# Patient Record
Sex: Male | Born: 1975
Health system: Southern US, Community
[De-identification: ages and names within clinical notes are randomized; demographics above are authoritative.]

## PROBLEM LIST (undated history)

## (undated) DIAGNOSIS — E1110 Type 2 diabetes mellitus with ketoacidosis without coma: Secondary | ICD-10-CM

## (undated) DIAGNOSIS — E1165 Type 2 diabetes mellitus with hyperglycemia: Secondary | ICD-10-CM

## (undated) DIAGNOSIS — F419 Anxiety disorder, unspecified: Secondary | ICD-10-CM

## (undated) DIAGNOSIS — M545 Low back pain, unspecified: Secondary | ICD-10-CM

## (undated) DIAGNOSIS — Z8719 Personal history of other diseases of the digestive system: Secondary | ICD-10-CM

## (undated) DIAGNOSIS — G8929 Other chronic pain: Secondary | ICD-10-CM

## (undated) DIAGNOSIS — IMO0002 Reserved for concepts with insufficient information to code with codable children: Secondary | ICD-10-CM

## (undated) DIAGNOSIS — I10 Essential (primary) hypertension: Secondary | ICD-10-CM

## (undated) DIAGNOSIS — Z8711 Personal history of peptic ulcer disease: Secondary | ICD-10-CM

## (undated) HISTORY — PX: BACK SURGERY: SHX140

---

## 2002-07-04 HISTORY — PX: LUMBAR DISC SURGERY: SHX700

## 2015-03-30 ENCOUNTER — Inpatient Hospital Stay (HOSPITAL_COMMUNITY)
Admission: EM | Admit: 2015-03-30 | Discharge: 2015-04-02 | DRG: 897 | Disposition: A | Discharge status: Home / Self Care | Payer: Self-pay | Attending: Internal Medicine | Admitting: Internal Medicine

## 2015-03-30 ENCOUNTER — Encounter (HOSPITAL_COMMUNITY): Payer: Self-pay | Admitting: Family Medicine

## 2015-03-30 ENCOUNTER — Inpatient Hospital Stay (HOSPITAL_COMMUNITY): Payer: Self-pay

## 2015-03-30 ENCOUNTER — Emergency Department (HOSPITAL_COMMUNITY): Payer: Self-pay

## 2015-03-30 DIAGNOSIS — E1165 Type 2 diabetes mellitus with hyperglycemia: Secondary | ICD-10-CM | POA: Diagnosis present

## 2015-03-30 DIAGNOSIS — M940 Chondrocostal junction syndrome [Tietze]: Secondary | ICD-10-CM | POA: Diagnosis present

## 2015-03-30 DIAGNOSIS — Z9289 Personal history of other medical treatment: Secondary | ICD-10-CM

## 2015-03-30 DIAGNOSIS — E871 Hypo-osmolality and hyponatremia: Secondary | ICD-10-CM | POA: Diagnosis present

## 2015-03-30 DIAGNOSIS — F10931 Alcohol use, unspecified with withdrawal delirium: Secondary | ICD-10-CM

## 2015-03-30 DIAGNOSIS — F10231 Alcohol dependence with withdrawal delirium: Principal | ICD-10-CM | POA: Diagnosis present

## 2015-03-30 DIAGNOSIS — G312 Degeneration of nervous system due to alcohol: Secondary | ICD-10-CM | POA: Diagnosis present

## 2015-03-30 DIAGNOSIS — E876 Hypokalemia: Secondary | ICD-10-CM | POA: Diagnosis present

## 2015-03-30 DIAGNOSIS — R109 Unspecified abdominal pain: Secondary | ICD-10-CM | POA: Diagnosis present

## 2015-03-30 DIAGNOSIS — E118 Type 2 diabetes mellitus with unspecified complications: Secondary | ICD-10-CM

## 2015-03-30 DIAGNOSIS — E119 Type 2 diabetes mellitus without complications: Secondary | ICD-10-CM | POA: Diagnosis present

## 2015-03-30 DIAGNOSIS — E1110 Type 2 diabetes mellitus with ketoacidosis without coma: Secondary | ICD-10-CM | POA: Diagnosis present

## 2015-03-30 HISTORY — DX: Alcohol dependence with withdrawal delirium: F10.231

## 2015-03-30 HISTORY — DX: Alcohol use, unspecified with withdrawal delirium: F10.931

## 2015-03-30 LAB — COMPREHENSIVE METABOLIC PANEL
ALT: 50 U/L (ref 17–63)
ALT: 43 U/L (ref 17–63)
ANION GAP: 11 (ref 5–15)
AST: 39 U/L (ref 15–41)
AST: 39 U/L (ref 15–41)
Albumin: 4.1 g/dL (ref 3.5–5.0)
Albumin: 3.7 g/dL (ref 3.5–5.0)
Alkaline Phosphatase: 93 U/L (ref 38–126)
Alkaline Phosphatase: 69 U/L (ref 38–126)
Anion gap: 11 (ref 5–15)
BUN: 11 mg/dL (ref 6–20)
BUN: 9 mg/dL (ref 6–20)
CHLORIDE: 103 mmol/L (ref 101–111)
CO2: 25 mmol/L (ref 22–32)
CO2: 22 mmol/L (ref 22–32)
Calcium: 9.4 mg/dL (ref 8.9–10.3)
Calcium: 8.7 mg/dL — ABNORMAL LOW (ref 8.9–10.3)
Chloride: 98 mmol/L — ABNORMAL LOW (ref 101–111)
Creatinine, Ser: 0.96 mg/dL (ref 0.61–1.24)
Creatinine, Ser: 0.68 mg/dL (ref 0.61–1.24)
GFR calc Af Amer: >60 mL/min (ref >60)
GFR calc non Af Amer: >60 mL/min (ref >60)
GFR calc non Af Amer: >60 mL/min (ref >60)
Glucose, Bld: 444 mg/dL — ABNORMAL HIGH (ref 65–99)
Glucose, Bld: 253 mg/dL — ABNORMAL HIGH (ref 65–99)
POTASSIUM: 3.5 mmol/L (ref 3.5–5.1)
Potassium: 4 mmol/L (ref 3.5–5.1)
SODIUM: 136 mmol/L (ref 135–145)
Sodium: 134 mmol/L — ABNORMAL LOW (ref 135–145)
Total Bilirubin: 0.8 mg/dL (ref 0.3–1.2)
Total Bilirubin: 0.6 mg/dL (ref 0.3–1.2)
Total Protein: 7.2 g/dL (ref 6.5–8.1)
Total Protein: 6.3 g/dL — ABNORMAL LOW (ref 6.5–8.1)

## 2015-03-30 LAB — CBC WITH DIFFERENTIAL/PLATELET
Basophils Absolute: 0.1 10*3/uL (ref 0.0–0.1)
Basophils Relative: 1 %
EOS ABS: 0.1 10*3/uL (ref 0.0–0.7)
EOS PCT: 2 %
HCT: 38.3 % — ABNORMAL LOW (ref 39.0–52.0)
Hemoglobin: 12.4 g/dL — ABNORMAL LOW (ref 13.0–17.0)
LYMPHS ABS: 1.5 10*3/uL (ref 0.7–4.0)
Lymphocytes Relative: 22 %
MCH: 26.2 pg (ref 26.0–34.0)
MCHC: 32.4 g/dL (ref 30.0–36.0)
MCV: 80.8 fL (ref 78.0–100.0)
MONOS PCT: 15 %
Monocytes Absolute: 1.1 10*3/uL — ABNORMAL HIGH (ref 0.1–1.0)
Neutro Abs: 4.2 10*3/uL (ref 1.7–7.7)
Neutrophils Relative %: 60 %
PLATELETS: 170 10*3/uL (ref 150–400)
RBC: 4.74 MIL/uL (ref 4.22–5.81)
RDW: 17.6 % — AB (ref 11.5–15.5)
WBC: 6.9 10*3/uL (ref 4.0–10.5)

## 2015-03-30 LAB — CBG MONITORING, ED: GLUCOSE-CAPILLARY: 244 mg/dL — AB (ref 65–99)

## 2015-03-30 LAB — PROTIME-INR
INR: 1.1 (ref 0.00–1.49)
PROTHROMBIN TIME: 14.4 s (ref 11.6–15.2)

## 2015-03-30 LAB — GLUCOSE, CAPILLARY
GLUCOSE-CAPILLARY: 86 mg/dL (ref 65–99)
Glucose-Capillary: 163 mg/dL — ABNORMAL HIGH (ref 65–99)

## 2015-03-30 LAB — CBC
HCT: 42.1 % (ref 39.0–52.0)
Hemoglobin: 13.9 g/dL (ref 13.0–17.0)
MCH: 26.8 pg (ref 26.0–34.0)
MCHC: 33 g/dL (ref 30.0–36.0)
MCV: 81.1 fL (ref 78.0–100.0)
Platelets: 175 10*3/uL (ref 150–400)
RBC: 5.19 MIL/uL (ref 4.22–5.81)
RDW: 17.6 % — ABNORMAL HIGH (ref 11.5–15.5)
WBC: 7.4 10*3/uL (ref 4.0–10.5)

## 2015-03-30 LAB — URINALYSIS, ROUTINE W REFLEX MICROSCOPIC
Bilirubin Urine: NEGATIVE
Glucose, UA: >1000 mg/dL — AB
Hgb urine dipstick: NEGATIVE
Ketones, ur: 15 mg/dL — AB
Leukocytes, UA: NEGATIVE
Nitrite: NEGATIVE
Protein, ur: NEGATIVE mg/dL
Specific Gravity, Urine: 1.021 (ref 1.005–1.030)
Urobilinogen, UA: 1 mg/dL (ref 0.0–1.0)
pH: 5.5 (ref 5.0–8.0)

## 2015-03-30 LAB — I-STAT VENOUS BLOOD GAS, ED
Acid-base deficit: 3 mmol/L — ABNORMAL HIGH (ref 0.0–2.0)
Bicarbonate: 21.8 mEq/L (ref 20.0–24.0)
O2 Saturation: 100 %
TCO2: 23 mmol/L (ref 0–100)
pCO2, Ven: 37.8 mmHg — ABNORMAL LOW (ref 45.0–50.0)
pH, Ven: 7.368 — ABNORMAL HIGH (ref 7.250–7.300)
pO2, Ven: 193 mmHg — ABNORMAL HIGH (ref 30.0–45.0)

## 2015-03-30 LAB — I-STAT ARTERIAL BLOOD GAS, ED
ACID-BASE DEFICIT: 2 mmol/L (ref 0.0–2.0)
Bicarbonate: 24.6 mEq/L — ABNORMAL HIGH (ref 20.0–24.0)
O2 SAT: 98 %
PCO2 ART: 49.8 mmHg — AB (ref 35.0–45.0)
PH ART: 7.307 — AB (ref 7.350–7.450)
TCO2: 26 mmol/L (ref 0–100)
pO2, Arterial: 117 mmHg — ABNORMAL HIGH (ref 80.0–100.0)

## 2015-03-30 LAB — RAPID URINE DRUG SCREEN, HOSP PERFORMED
AMPHETAMINES: NOT DETECTED
BENZODIAZEPINES: NOT DETECTED
Barbiturates: NOT DETECTED
Cocaine: NOT DETECTED
OPIATES: NOT DETECTED
TETRAHYDROCANNABINOL: NOT DETECTED

## 2015-03-30 LAB — APTT: aPTT: 23 seconds — ABNORMAL LOW (ref 24–37)

## 2015-03-30 LAB — LIPASE, BLOOD: LIPASE: 35 U/L (ref 22–51)

## 2015-03-30 LAB — ETHANOL: Alcohol, Ethyl (B): <5 mg/dL (ref <5)

## 2015-03-30 LAB — URINE MICROSCOPIC-ADD ON

## 2015-03-30 LAB — PHOSPHORUS: PHOSPHORUS: 4.1 mg/dL (ref 2.5–4.6)

## 2015-03-30 LAB — I-STAT CG4 LACTIC ACID, ED: Lactic Acid, Venous: 4.45 mmol/L (ref 0.5–2.0)

## 2015-03-30 LAB — MRSA PCR SCREENING: MRSA by PCR: NEGATIVE

## 2015-03-30 LAB — MAGNESIUM: Magnesium: 1.8 mg/dL (ref 1.7–2.4)

## 2015-03-30 MED ORDER — VITAMIN B-1 100 MG PO TABS
100.0000 mg | ORAL_TABLET | Freq: Every day | ORAL | Status: DC
  Administered 2015-04-01: 100 mg via ORAL
  Filled 2015-03-30: qty 1

## 2015-03-30 MED ORDER — DEXMEDETOMIDINE HCL IN NACL 200 MCG/50ML IV SOLN: 0.4000 ug/kg/h | INTRAVENOUS | Status: DC

## 2015-03-30 MED ORDER — SODIUM CHLORIDE 0.9 % IV SOLN
INTRAVENOUS | Status: DC
  Administered 2015-03-30: 100 mL/h via INTRAVENOUS
  Administered 2015-03-30 – 2015-04-01 (×5): via INTRAVENOUS

## 2015-03-30 MED ORDER — DEXMEDETOMIDINE HCL IN NACL 400 MCG/100ML IV SOLN
0.4000 ug/kg/h | INTRAVENOUS | Status: DC
  Administered 2015-03-30 (×2): 0.4 ug/kg/h via INTRAVENOUS
  Administered 2015-03-30: 1.2 ug/kg/h via INTRAVENOUS
  Filled 2015-03-30: qty 50
  Filled 2015-03-30: qty 100

## 2015-03-30 MED ORDER — THIAMINE HCL 100 MG/ML IJ SOLN
100.0000 mg | Freq: Every day | INTRAMUSCULAR | Status: DC
  Administered 2015-03-30 – 2015-03-31 (×2): 100 mg via INTRAVENOUS
  Filled 2015-03-30 (×2): qty 2

## 2015-03-30 MED ORDER — KETAMINE HCL 50 MG/ML IJ SOLN
200.0000 mg | Freq: Once | INTRAMUSCULAR | Status: AC
  Administered 2015-03-30: 200 mg via INTRAMUSCULAR
  Filled 2015-03-30: qty 4

## 2015-03-30 MED ORDER — LORAZEPAM 2 MG/ML IJ SOLN
0.0000 mg | Freq: Two times a day (BID) | INTRAMUSCULAR | Status: DC
  Administered 2015-03-31 – 2015-04-01 (×2): 1 mg via INTRAVENOUS
  Filled 2015-03-30 (×2): qty 1

## 2015-03-30 MED ORDER — CETYLPYRIDINIUM CHLORIDE 0.05 % MT LIQD
7.0000 mL | Freq: Two times a day (BID) | OROMUCOSAL | Status: DC
  Administered 2015-03-30 – 2015-04-02 (×6): 7 mL via OROMUCOSAL

## 2015-03-30 MED ORDER — KETAMINE HCL 50 MG/ML IJ SOLN
325.0000 mg | Freq: Once | INTRAMUSCULAR | Status: AC
Start: 2015-03-30 — End: 2015-03-30
  Administered 2015-03-30: 325 mg via INTRAMUSCULAR
  Filled 2015-03-30: qty 6.5

## 2015-03-30 MED ORDER — LORAZEPAM 2 MG/ML IJ SOLN
0.0000 mg | Freq: Four times a day (QID) | INTRAMUSCULAR | Status: DC
  Administered 2015-03-30: 2 mg via INTRAVENOUS

## 2015-03-30 MED ORDER — LORAZEPAM 1 MG PO TABS: 0.0000 mg | ORAL_TABLET | Freq: Four times a day (QID) | ORAL | Status: DC

## 2015-03-30 MED ORDER — LORAZEPAM 1 MG PO TABS
0.0000 mg | ORAL_TABLET | Freq: Two times a day (BID) | ORAL | Status: DC
Start: 2015-03-30 — End: 2015-03-30

## 2015-03-30 MED ORDER — DIPHENHYDRAMINE HCL 50 MG/ML IJ SOLN: 50.0000 mg | Freq: Once | INTRAMUSCULAR | Status: DC

## 2015-03-30 MED ORDER — LORAZEPAM 2 MG/ML IJ SOLN
2.0000 mg | Freq: Once | INTRAMUSCULAR | Status: AC
  Administered 2015-03-30: 2 mg via INTRAVENOUS
  Filled 2015-03-30: qty 1

## 2015-03-30 MED ORDER — INSULIN ASPART 100 UNIT/ML ~~LOC~~ SOLN
2.0000 [IU] | SUBCUTANEOUS | Status: DC
  Administered 2015-03-30: 6 [IU] via SUBCUTANEOUS
  Administered 2015-03-30: 4 [IU] via SUBCUTANEOUS
  Administered 2015-03-31: 2 [IU] via SUBCUTANEOUS
  Administered 2015-03-31: 4 [IU] via SUBCUTANEOUS
  Filled 2015-03-30: qty 1

## 2015-03-30 MED ORDER — THIAMINE HCL 100 MG/ML IJ SOLN
Freq: Once | INTRAVENOUS | Status: AC
  Administered 2015-03-31: 14:00:00 via INTRAVENOUS
  Filled 2015-03-30 (×2): qty 1000

## 2015-03-30 MED ORDER — LORAZEPAM 2 MG/ML IJ SOLN
0.0000 mg | Freq: Four times a day (QID) | INTRAMUSCULAR | Status: DC
  Administered 2015-03-31 – 2015-04-01 (×2): 2 mg via INTRAVENOUS
  Filled 2015-03-30 (×3): qty 1

## 2015-03-30 MED ORDER — ZIPRASIDONE MESYLATE 20 MG IM SOLR
20.0000 mg | Freq: Once | INTRAMUSCULAR | Status: AC
  Administered 2015-03-30: 20 mg via INTRAMUSCULAR
  Filled 2015-03-30: qty 20

## 2015-03-30 MED ORDER — SODIUM CHLORIDE 0.9 % IV SOLN
Freq: Once | INTRAVENOUS | Status: AC
  Administered 2015-03-30: 15:00:00 via INTRAVENOUS
  Filled 2015-03-30: qty 1000

## 2015-03-30 MED ORDER — DIPHENHYDRAMINE HCL 50 MG/ML IJ SOLN
50.0000 mg | Freq: Once | INTRAMUSCULAR | Status: AC
  Administered 2015-03-30: 50 mg via INTRAVENOUS

## 2015-03-30 MED ORDER — PANTOPRAZOLE SODIUM 40 MG IV SOLR
40.0000 mg | INTRAVENOUS | Status: DC
  Administered 2015-03-30 – 2015-04-01 (×3): 40 mg via INTRAVENOUS
  Filled 2015-03-30 (×3): qty 40

## 2015-03-30 MED ORDER — DIPHENHYDRAMINE HCL 50 MG/ML IJ SOLN
INTRAMUSCULAR | Status: AC
  Filled 2015-03-30: qty 1

## 2015-03-30 NOTE — ED Notes (Signed)
Family at the bedside and verified of patient's condition. Family verbalized consent.

## 2015-03-30 NOTE — ED Notes (Addendum)
Patient continued to be combative. Patient was not responding to medication. MD Gwendolyn Grant and PA Ebbie Ridge at the bedside with this RN and Security. Patient was continuing to be combative even when asked to calm down. Patient was alert, breathing, and good pulses continued.

## 2015-03-30 NOTE — Progress Notes (Signed)
eLink Physician-Brief Progress Note Patient Name: MATTHEW CINA DOB: 02/03/1976 MRN: [id number]   Date of Service  03/30/2015  HPI/Events of Note  The pt was seen in ICU via elink .  He is now calm after precedex initiation.  BP better with volume   eICU Interventions  Recheck lactic acid     Intervention Category Major Interventions: Delirium, psychosis, severe agitation - evaluation and management  Shan Levans 03/30/2015, 7:39 PM

## 2015-03-30 NOTE — ED Notes (Signed)
Called [facility] after request of [facility] to call and verify patient's bed placement. AC stated He would take care of it.

## 2015-03-30 NOTE — ED Notes (Signed)
PA Lawyer notified of patient CIWA, pt has safety sitter at bedside due to patient attempting to get out of bed. Pt pulled IV out.

## 2015-03-30 NOTE — ED Notes (Signed)
Pt Niece request to be called if Pt is roomed elsewhere [phone] [facility]

## 2015-03-30 NOTE — ED Notes (Signed)
Spoke with admitting. Patient is to be transported upstairs with bolus.

## 2015-03-30 NOTE — H&P (Signed)
PULMONARY / CRITICAL CARE MEDICINE   Name: Jacob Schneider MRN: [id number] DOB: [birth year]    ADMISSION DATE:  03/30/2015 CONSULTATION DATE:  9/26  REFERRING MD :  Hermelinda Medicus PA-C  CHIEF COMPLAINT:  DTs  CHIEF COMPLAINT: AMS  INITIAL PRESENTATION: 39 year old male new to [place] from [place]. ETOH abuse history. Last drink 9/20. Initially family hoped he could withdrawal at home, but confusion and hallucinations progressed and he presented to ED 9/26 with these complaints. Agitation was refractory to benzodiazepines, anti-psychotics, and ketamine in ED. PCCM to admit for precedex.   STUDIES:  SIGNIFICANT EVENTS:  9/26 Abdominal US > No acute findings. Increased parenchymal echogenicity suggestive of hepatic steatosis.   HISTORY OF PRESENT ILLNESS: 39 year old male with PMH as below, which includes DM and ETOH abuse. He is a Spanish speaking male, and history has been obtained by    PAST MEDICAL HISTORY :   has a past medical history of Diabetes mellitus without complication.  has no past surgical history on file. Prior to Admission medications   Not on File   No Known Allergies  FAMILY HISTORY:  has no family status information on file.  SOCIAL HISTORY:  reports that he has never smoked. He does not have any smokeless tobacco history on file. He reports that he drinks alcohol.  REVIEW OF SYSTEMS:  Unable   SUBJECTIVE:  Agitated and restless  VITAL SIGNS: Temp:  [98.3 F (36.8 C)] 98.3 F (36.8 C) (09/26 0916) Pulse Rate:  [76-95] 92 (09/26 1430) Resp:  [19-26] 19 (09/26 1430) BP: (128-142)/(83-103) 135/83 mmHg (09/26 1430) SpO2:  [99 %-100 %] 100 % (09/26 1315) Weight:  [81.647 kg (180 lb)] 81.647 kg (180 lb) (09/26 1500) HEMODYNAMICS:   VENTILATOR SETTINGS:   INTAKE / OUTPUT: No intake or output data in the 24 hours ending 03/30/15 1529  PHYSICAL EXAMINATION: General:   Neuro:  Agitated, restless and hallucinating moving all ext  HEENT:  NCAT no JVD  MMM Cardiovascular:  Tachy rrr  Lungs:  Clear w/out accessory muscle use  Abdomen:  Soft, non-distended no OM + bowel sounds  Musculoskeletal:  Intact  Skin:  Intact   LABS:  CBC  Recent Labs Lab 03/30/15 0931  WBC 7.4  HGB 13.9  HCT 42.1  PLT 175   Coag's No results for input(s): APTT, INR in the last 168 hours. BMET  Recent Labs Lab 03/30/15 0931  NA 134*  K 4.0  CL 98*  CO2 25  BUN 11  CREATININE 0.96  GLUCOSE 444*   Electrolytes  Recent Labs Lab 03/30/15 0931  CALCIUM 9.4   Sepsis Markers No results for input(s): LATICACIDVEN, PROCALCITON, O2SATVEN in the last 168 hours. ABG No results for input(s): PHART, PCO2ART, PO2ART in the last 168 hours. Liver Enzymes  Recent Labs Lab 03/30/15 0931  AST 39  ALT 50  ALKPHOS 93  BILITOT 0.8  ALBUMIN 4.1   Cardiac Enzymes No results for input(s): TROPONINI, PROBNP in the last 168 hours. Glucose No results for input(s): GLUCAP in the last 168 hours.  Imaging US Abdomen Complete  03/30/2015   CLINICAL DATA:  Upper abdominal pain  EXAM: ULTRASOUND ABDOMEN COMPLETE  COMPARISON:  None.  FINDINGS: Gallbladder: No gallstones or wall thickening visualized. No sonographic Murphy sign noted.  Common bile duct: Diameter: 5.1 mm  Liver: No focal lesion identified. Increase in parenchymal echogenicity.  IVC: No abnormality visualized.  Pancreas: Visualized portion unremarkable.  Spleen: Size and appearance within normal limits.  Right Kidney: Length: 11.7 cm. Echogenicity within normal limits. No mass or hydronephrosis visualized.  Left Kidney: Length: 11.5 cm. Echogenicity within normal limits. No mass or hydronephrosis visualized.  Abdominal aorta: No aneurysm visualized.  Other findings: None.  IMPRESSION: 1. No acute findings. 2. Increased parenchymal echogenicity suggestive of hepatic steatosis.   Electronically Signed   By: Signa Kell M.D.   On: 03/30/2015 14:18     ASSESSMENT / PLAN:  PULMONARY OETT A: At  risk for airway compromise  P:   NPO Wean FIO2 as needed    CARDIOVASCULAR CVL A:  Sinus tachycardia in setting of w/d P:  Admit to ICU Tele monitoring    RENAL A:   Mild hyponatremia  P:   NS F/u labs   GASTROINTESTINAL A:   No acute  P:   Diet when able   HEMATOLOGIC A:   No acute  P:  [place] heparin  Transfuse per ICU protocol   INFECTIOUS A:   No evidence of infection  P:   Trend fever and WBC curve   ENDOCRINE A:   Hyperglycemia w/ h/o DM  P:   ssi  NEUROLOGIC A:   Acute encephalopathy in setting of Delirium tremens  P:   RASS goal: -1 precedex gtt  IV thiamine and folate Sitter if needed    FAMILY  - Updates: pending   - Inter-disciplinary family meet or Palliative Care meeting due by:10/2   TODAY'S SUMMARY:  Will admit to ICU for DTs and support w/ precedex, thiamine and folate.   Simonne Martinet ACNP-BC [facility] Pulmonary/Critical Care Pager # [phone] OR # [phone] if no answer   03/30/2015, 3:29 PM   Attending Note:  39 year old male with history of alcohol abuse who moved from [place] to [place] to be with family to help him with alcoholism and help him find a job. Last drink was Tuesday. He started becoming very confused and was brought to the ED where he became combative and multiple drugs were tried without success. Finally was placed on precedex and PCCM called to admit. On exam, he is arousable but very confused. Lungs are clear. Will admit to the ICU for closer monitoring. Seizure precautions. Precedex drip. CIWA protocol. Banana bag. Maintain NPO. GI and DVT prophylaxis as ordered. There were no family bedside to update.  The patient is critically ill with multiple organ systems failure and requires high complexity decision making for assessment and support, frequent evaluation and titration of therapies, application of advanced monitoring technologies and extensive interpretation of multiple databases.   Critical  Care Time devoted to patient care services described in this note is 35 Minutes. This time reflects time of care of this signee Dr Koren Bound. This critical care time does not reflect procedure time, or teaching time or supervisory time of PA/NP/Med student/Med Resident etc but could involve care discussion time.  Alyson Reedy, M.D. [facility] Pulmonary/Critical Care Medicine. Pager: [phone]. After hours pager: [phone].

## 2015-03-30 NOTE — ED Notes (Signed)
Patient taken out of restraints. Patient resting. Remains on the monitor.

## 2015-03-30 NOTE — ED Notes (Signed)
PA Student at the bedside.  

## 2015-03-30 NOTE — ED Notes (Signed)
Pt here for alcohol detox. sts he has been drinking every day all day for a month. sts he hasnt had alcohol in 1 week. Pt is hallucinating, tremors. sts he feels like someone is poking him

## 2015-03-30 NOTE — ED Notes (Signed)
Attempted Report x1.   

## 2015-03-30 NOTE — ED Notes (Signed)
Admitting paged. Patient in Trendelenburg and a bolus started.

## 2015-03-30 NOTE — ED Notes (Signed)
Patient trying to bite care givers. Patient made aware to calm down. Patient continues to talk to [place] and fight nurses and MDs.

## 2015-03-30 NOTE — ED Notes (Signed)
Critical Care at the bedside  

## 2015-03-30 NOTE — ED Notes (Addendum)
Patient was in the room with safety sitter when patient started to try and stand on the bed. PA who spoke spanished started to speak with patient and patient was not understanding what the PA was saying. Patient started to become combative and was standing on the bed and trying get out of bed. This RN and NT Ladona Ridgel were trying to keep patient calm and seated in the bed. PA at the bedside immediately. Patient began to fight nurse and PA. At this time, Security was contacted for the patient and staff's safety. Patient was encouraged to calm down many times.

## 2015-03-30 NOTE — Progress Notes (Signed)
eLink Physician-Brief Progress Note Patient Name: Jacob Schneider DOB: Oct 26, 1975 MRN: [id number]   Date of Service  03/30/2015  HPI/Events of Note  Lactate 4.5 hypotension  eICU Interventions  chck cxr, rechk lactic acid     Intervention Category Major Interventions: Hypotension - evaluation and management  Shan Levans 03/30/2015, 7:46 PM

## 2015-03-30 NOTE — ED Provider Notes (Signed)
CSN: [id number]     Arrival date & time 03/30/15  0902 History   First MD Initiated Contact with Patient 03/30/15 1133     Chief Complaint  Patient presents with  . Delirium Tremens (DTS)     (Consider location/radiation/quality/duration/timing/severity/associated sxs/prior Treatment) HPI Patient presents to the emergency department with.  I will call withdrawal.  The patient stopped drinking about 1 week ago.  The history is obtained through his family member who can translate from [place] to [place].  Patient has had confusion and agitation over the last day has been hallucinating as well is been complaining of seeing things that are not there and having tactile hallucinations.  Patient does not have any chest pain, shortness of breath, nausea, vomiting, diarrhea, loss of consciousness, fever or syncope.  The patient does have abdominal pain as well Past Medical History  Diagnosis Date  . Diabetes mellitus without complication    History reviewed. No pertinent past surgical history. History reviewed. No pertinent family history. Social History  Substance Use Topics  . Smoking status: Never Smoker   . Smokeless tobacco: None  . Alcohol Use: Yes     Comment: "all day beer and liquor"    Review of Systems Level 5 caveat due to altered mental status.  Allergies  Review of patient's allergies indicates no known allergies.  Home Medications   Prior to Admission medications   Not on File   BP 135/83 mmHg  Pulse 92  Temp(Src) 98.3 F (36.8 C) (Oral)  Resp 19  Wt 180 lb (81.647 kg)  SpO2 100% Physical Exam  Constitutional: He appears well-developed and well-nourished. No distress.  HENT:  Head: Normocephalic and atraumatic.  Mouth/Throat: Oropharynx is clear and moist.  Eyes: Pupils are equal, round, and reactive to light.  Cardiovascular: Normal rate, regular rhythm and normal heart sounds.  Exam reveals no gallop and no friction rub.   No murmur heard. Pulmonary/Chest:  Effort normal and breath sounds normal. No respiratory distress.  Neurological: He is alert. He exhibits normal muscle tone. Coordination normal.  She appears tremulous and having hallucinations intermittently.   Skin: Skin is warm and dry. No rash noted. No erythema.  Psychiatric: He is agitated, aggressive and actively hallucinating. Thought content is delusional.  Nursing note and vitals reviewed.   ED Course  Procedures (including critical care time) Labs Review Labs Reviewed  COMPREHENSIVE METABOLIC PANEL - Abnormal; Notable for the following:    Sodium 134 (*)    Chloride 98 (*)    Glucose, Bld 444 (*)    All other components within normal limits  CBC - Abnormal; Notable for the following:    RDW 17.6 (*)    All other components within normal limits  ETHANOL  URINE RAPID DRUG SCREEN, HOSP PERFORMED  URINALYSIS, ROUTINE W REFLEX MICROSCOPIC (NOT AT [facility])  LIPASE, BLOOD  I-STAT VENOUS BLOOD GAS, ED  I-STAT CG4 LACTIC ACID, ED    Imaging Review US Abdomen Complete  03/30/2015   CLINICAL DATA:  Upper abdominal pain  EXAM: ULTRASOUND ABDOMEN COMPLETE  COMPARISON:  None.  FINDINGS: Gallbladder: No gallstones or wall thickening visualized. No sonographic Murphy sign noted.  Common bile duct: Diameter: 5.1 mm  Liver: No focal lesion identified. Increase in parenchymal echogenicity.  IVC: No abnormality visualized.  Pancreas: Visualized portion unremarkable.  Spleen: Size and appearance within normal limits.  Right Kidney: Length: 11.7 cm. Echogenicity within normal limits. No mass or hydronephrosis visualized.  Left Kidney: Length: 11.5 cm. Echogenicity within  normal limits. No mass or hydronephrosis visualized.  Abdominal aorta: No aneurysm visualized.  Other findings: None.  IMPRESSION: 1. No acute findings. 2. Increased parenchymal echogenicity suggestive of hepatic steatosis.   Electronically Signed   By: Signa Kell M.D.   On: 03/30/2015 14:18   I have personally reviewed and  evaluated these images and lab results as part of my medical decision-making.      Charlestine Night, PA-C 03/30/15 1538  Elwin Mocha, MD 03/30/15 [phone]

## 2015-03-30 NOTE — ED Notes (Signed)
Patient continued to be alert and fighting security and staff. Patient asked to calm down with no relief.

## 2015-03-31 ENCOUNTER — Inpatient Hospital Stay (HOSPITAL_COMMUNITY): Payer: Self-pay

## 2015-03-31 DIAGNOSIS — E876 Hypokalemia: Secondary | ICD-10-CM

## 2015-03-31 DIAGNOSIS — R739 Hyperglycemia, unspecified: Secondary | ICD-10-CM

## 2015-03-31 LAB — BASIC METABOLIC PANEL
Anion gap: 9 (ref 5–15)
BUN: 8 mg/dL (ref 6–20)
CALCIUM: 8.4 mg/dL — AB (ref 8.9–10.3)
CO2: 23 mmol/L (ref 22–32)
CREATININE: 0.54 mg/dL — AB (ref 0.61–1.24)
Chloride: 106 mmol/L (ref 101–111)
GFR calc non Af Amer: >60 mL/min (ref >60)
Glucose, Bld: 152 mg/dL — ABNORMAL HIGH (ref 65–99)
Potassium: 3 mmol/L — ABNORMAL LOW (ref 3.5–5.1)
SODIUM: 138 mmol/L (ref 135–145)

## 2015-03-31 LAB — BLOOD GAS, ARTERIAL
ACID-BASE DEFICIT: 3 mmol/L — AB (ref 0.0–2.0)
Bicarbonate: 21.2 mEq/L (ref 20.0–24.0)
DRAWN BY: 437521
FIO2: 0.21
O2 Saturation: 93.8 %
PCO2 ART: 35.8 mmHg (ref 35.0–45.0)
PH ART: 7.387 (ref 7.350–7.450)
PO2 ART: 71.5 mmHg — AB (ref 80.0–100.0)
Patient temperature: 97.7
TCO2: 22.4 mmol/L (ref 0–100)

## 2015-03-31 LAB — CBC
HCT: 39.3 % (ref 39.0–52.0)
Hemoglobin: 12.5 g/dL — ABNORMAL LOW (ref 13.0–17.0)
MCH: 26.3 pg (ref 26.0–34.0)
MCHC: 31.8 g/dL (ref 30.0–36.0)
MCV: 82.7 fL (ref 78.0–100.0)
Platelets: 152 10*3/uL (ref 150–400)
RBC: 4.75 MIL/uL (ref 4.22–5.81)
RDW: 17.9 % — AB (ref 11.5–15.5)
WBC: 6.8 10*3/uL (ref 4.0–10.5)

## 2015-03-31 LAB — MAGNESIUM: MAGNESIUM: 2.1 mg/dL (ref 1.7–2.4)

## 2015-03-31 LAB — GLUCOSE, CAPILLARY
GLUCOSE-CAPILLARY: 161 mg/dL — AB (ref 65–99)
GLUCOSE-CAPILLARY: 142 mg/dL — AB (ref 65–99)
GLUCOSE-CAPILLARY: 325 mg/dL — AB (ref 65–99)
GLUCOSE-CAPILLARY: 319 mg/dL — AB (ref 65–99)
GLUCOSE-CAPILLARY: 355 mg/dL — AB (ref 65–99)
Glucose-Capillary: 79 mg/dL (ref 65–99)

## 2015-03-31 LAB — LACTIC ACID, PLASMA: LACTIC ACID, VENOUS: 1.2 mmol/L (ref 0.5–2.0)

## 2015-03-31 LAB — PHOSPHORUS: PHOSPHORUS: 4.5 mg/dL (ref 2.5–4.6)

## 2015-03-31 MED ORDER — INSULIN ASPART 100 UNIT/ML ~~LOC~~ SOLN
0.0000 [IU] | Freq: Every day | SUBCUTANEOUS | Status: DC
  Administered 2015-03-31: 4 [IU] via SUBCUTANEOUS

## 2015-03-31 MED ORDER — POTASSIUM CHLORIDE 10 MEQ/100ML IV SOLN
10.0000 meq | INTRAVENOUS | Status: AC
  Administered 2015-03-31 (×4): 10 meq via INTRAVENOUS
  Filled 2015-03-31 (×4): qty 100

## 2015-03-31 MED ORDER — ACETAMINOPHEN 325 MG PO TABS
325.0000 mg | ORAL_TABLET | Freq: Four times a day (QID) | ORAL | Status: DC | PRN
  Administered 2015-03-31 – 2015-04-02 (×5): 325 mg via ORAL
  Filled 2015-03-31 (×5): qty 1

## 2015-03-31 MED ORDER — INSULIN ASPART 100 UNIT/ML ~~LOC~~ SOLN
0.0000 [IU] | Freq: Three times a day (TID) | SUBCUTANEOUS | Status: DC
  Administered 2015-03-31: 7 [IU] via SUBCUTANEOUS
  Administered 2015-04-01: 3 [IU] via SUBCUTANEOUS

## 2015-03-31 MED ORDER — ZOLPIDEM TARTRATE 5 MG PO TABS: 5.0000 mg | ORAL_TABLET | Freq: Every evening | ORAL | Status: DC | PRN

## 2015-03-31 MED ORDER — INSULIN GLARGINE 100 UNIT/ML ~~LOC~~ SOLN
10.0000 [IU] | Freq: Every day | SUBCUTANEOUS | Status: DC
  Administered 2015-03-31 – 2015-04-01 (×2): 10 [IU] via SUBCUTANEOUS
  Filled 2015-03-31 (×3): qty 0.1

## 2015-03-31 NOTE — Progress Notes (Signed)
[facility] ADULT ICU REPLACEMENT PROTOCOL FOR AM LAB REPLACEMENT ONLY  The patient does apply for the [facility] Adult ICU Electrolyte Replacment Protocol based on the criteria listed below:   1. Is GFR >/= 40 ml/min? Yes.    Patient's GFR today is >60 2. Is urine output >/= 0.5 ml/kg/hr for the last 6 hours? Yes.   Patient's UOP is 1.5 ml/kg/hr 3. Is BUN < 60 mg/dL? Yes.    Patient's BUN today is 8 4. Abnormal electrolyte(s): K3.0 5. Ordered repletion with: per protocol 6. If a panic level lab has been reported, has the CCM MD in charge been notified? Yes.  .   Physician:  D Sharmaine Base 03/31/2015 6:30 AM

## 2015-03-31 NOTE — Progress Notes (Signed)
Report called to 6 Airline pilot.  Called pts niece Byrd Hesselbach who is on his contact sheet and notified her of patients new room.  Allen, [place] Marie, RN

## 2015-03-31 NOTE — Progress Notes (Signed)
PULMONARY / CRITICAL CARE MEDICINE   Name: Jacob Schneider MRN: [id number] DOB: Mar 24, 1976    ADMISSION DATE:  04/02/2015 CONSULTATION DATE:  04/02/23  REFERRING MD :  Hermelinda Medicus PA-C  CHIEF COMPLAINT:  DTs  CHIEF COMPLAINT: AMS  INITIAL PRESENTATION: 39 year old male new to [place] from [place]. ETOH abuse history. Last drink 9/20. Initially family hoped he could withdrawal at home, but confusion and hallucinations progressed and he presented to ED 04/02/2023 with these complaints. Agitation was refractory to benzodiazepines, anti-psychotics, and ketamine in ED. PCCM to admit for precedex.   STUDIES:    SIGNIFICANT EVENTS:  04/02/2023 Abdominal US > No acute findings. Increased parenchymal echogenicity suggestive of hepatic steatosis.   HISTORY OF PRESENT ILLNESS: 39 year old male with PMH as below, which includes DM and ETOH abuse. He is a [place] speaking male, and history has been obtained by   SUBJECTIVE:  Alert and interactive on precedex   VITAL SIGNS: Temp:  [97.5 F (36.4 C)-100.3 F (37.9 C)] 99.4 F (37.4 C) (09/27 0751) Pulse Rate:  [52-150] 86 (09/27 0900) Resp:  [11-33] 19 (09/27 0900) BP: (89-142)/(52-103) 130/80 mmHg (09/27 0900) SpO2:  [95 %-100 %] 100 % (09/27 0900) Weight:  [78.4 kg (172 lb 13.5 oz)-81.647 kg (180 lb)] 78.4 kg (172 lb 13.5 oz) 04/02/23 2000)  HEMODYNAMICS:   VENTILATOR SETTINGS:   INTAKE / OUTPUT:  Intake/Output Summary (Last 24 hours) at 03/31/15 1100 Last data filed at 03/31/15 1036  Gross per 24 hour  Intake 1718.11 ml  Output   1725 ml  Net  -6.89 ml    PHYSICAL EXAMINATION: General:  Well appearing, NAD Neuro:  Alert and interactive, moving all ext to command HEENT:  NCAT no JVD MMM Cardiovascular:  RRR, Nl S1/S2, -M/R/G.  Lungs:  Clear w/out accessory muscle use  Abdomen:  Soft, non-distended no OM + bowel sounds  Musculoskeletal:  Intact  Skin:  Intact   LABS:  CBC  Recent Labs Lab 04-02-15 0931 April 02, 2015 1548 03/31/15 0219  WBC  7.4 6.9 6.8  HGB 13.9 12.4* 12.5*  HCT 42.1 38.3* 39.3  PLT 175 170 152   Coag's  Recent Labs Lab April 02, 2015 1548  APTT 23*  INR 1.10   BMET  Recent Labs Lab 02-Apr-2015 0931 2015/04/02 1548 03/31/15 0219  NA 134* 136 138  K 4.0 3.5 3.0*  CL 98* 103 106  CO2 BUN CREATININE 0.96 0.68 0.54*  GLUCOSE 444* 253* 152*   Electrolytes  Recent Labs Lab 02-Apr-2015 0931 2015-04-02 1548 03/31/15 0219  CALCIUM 9.4 8.7* 8.4*  MG  --  1.8 2.1  PHOS  --  4.1 4.5   Sepsis Markers  Recent Labs Lab 2015/04/02 1551 03/31/15 0717  LATICACIDVEN 4.45* 1.2   ABG  Recent Labs Lab 2015/04/02 1723 03/31/15 0518  PHART 7.307* 7.387  PCO2ART 49.8* 35.8  PO2ART 117.0* 71.5*   Liver Enzymes  Recent Labs Lab Apr 02, 2015 0931 April 02, 2015 1548  AST 39 39  ALT 50 43  ALKPHOS 93 69  BILITOT 0.8 0.6  ALBUMIN 4.1 3.7   Cardiac Enzymes No results for input(s): TROPONINI, PROBNP in the last 168 hours. Glucose  Recent Labs Lab April 02, 2015 1645 Apr 02, 2015 2026 Apr 02, 2015 2325 03/31/15 0414 03/31/15 0749  GLUCAP 244* 163* 86 161* 142*    Imaging US Abdomen Complete  April 02, 2015   CLINICAL DATA:  Upper abdominal pain  EXAM: ULTRASOUND ABDOMEN COMPLETE  COMPARISON:  None.  FINDINGS: Gallbladder:  No gallstones or wall thickening visualized. No sonographic Murphy sign noted.  Common bile duct: Diameter: 5.1 mm  Liver: No focal lesion identified. Increase in parenchymal echogenicity.  IVC: No abnormality visualized.  Pancreas: Visualized portion unremarkable.  Spleen: Size and appearance within normal limits.  Right Kidney: Length: 11.7 cm. Echogenicity within normal limits. No mass or hydronephrosis visualized.  Left Kidney: Length: 11.5 cm. Echogenicity within normal limits. No mass or hydronephrosis visualized.  Abdominal aorta: No aneurysm visualized.  Other findings: None.  IMPRESSION: 1. No acute findings. 2. Increased parenchymal echogenicity suggestive of hepatic steatosis.    Electronically Signed   By: Signa Kell M.D.   On: 03/30/2015 14:18   Dg Chest Port 1 View  03/31/2015   CLINICAL DATA:  Delay area, alcohol withdrawal  EXAM: PORTABLE CHEST - 1 VIEW  COMPARISON:  03/30/2015  FINDINGS: Cardiac shadow is within normal limits. The lungs are clear bilaterally. No acute bony abnormality is seen.  IMPRESSION: No active disease.   Electronically Signed   By: Alcide Clever M.D.   On: 03/31/2015 07:48   Dg Chest Port 1 View  03/30/2015   CLINICAL DATA:  Delirium.  Alcohol withdrawal.  EXAM: PORTABLE CHEST 1 VIEW  COMPARISON:  None.  FINDINGS: Low volume chest. Cardiopericardial silhouette within normal limits. Mediastinal contours normal. Trachea midline. No airspace disease or effusion. Monitoring leads project over the chest.  IMPRESSION: No active disease.   Electronically Signed   By: Andreas Newport M.D.   On: 03/30/2015 20:03     ASSESSMENT / PLAN:  PULMONARY OETT A: No active issues P:   Begin diet. Monitor for airway protection while on precedex Wean FIO2 as needed   CARDIOVASCULAR CVL A:  Sinus tachycardia in setting of w/d, improving with precedex P:  Titrate precedex and monitor HR and BP Tele monitoring   RENAL A:   Mild hyponatremia  P:   KVO IVF Replace electrolytes as needed BMET in AM  GASTROINTESTINAL A:   No acute  P:   Diet when able   HEMATOLOGIC A:   No acute  P:  [place] heparin  Transfuse per ICU protocol   INFECTIOUS A:   No evidence of infection  P:   Trend fever and WBC curve   ENDOCRINE A:   Hyperglycemia w/ h/o DM  P:   SSI CBG  NEUROLOGIC A:   Acute encephalopathy in setting of Delirium tremens, improving P:   RASS goal: -1 Precedex gtt titrate down as able IV thiamine and folate Sitter if needed   FAMILY  - Updates: pending   - Inter-disciplinary family meet or Palliative Care meeting due by:10/2  The patient is critically ill with multiple organ systems failure and requires high  complexity decision making for assessment and support, frequent evaluation and titration of therapies, application of advanced monitoring technologies and extensive interpretation of multiple databases.   Critical Care Time devoted to patient care services described in this note is  35  Minutes. This time reflects time of care of this signee Dr Koren Bound. This critical care time does not reflect procedure time, or teaching time or supervisory time of PA/NP/Med student/Med Resident etc but could involve care discussion time.  Alyson Reedy, M.D. [facility] Pulmonary/Critical Care Medicine. Pager: [phone]. After hours pager: [phone].  03/31/2015, 11:00 AM

## 2015-03-31 NOTE — Progress Notes (Signed)
Patient off precedex since this AM.  Transfer to med-surg and to [facility] service with PCCM off 9/28.  Dr. Joseph Art notified.  Alyson Reedy, M.D. [facility] Pulmonary/Critical Care Medicine. Pager: [phone]. After hours pager: [phone].

## 2015-03-31 NOTE — Progress Notes (Signed)
eLink Physician-Brief Progress Note Patient Name: Jacob Schneider DOB: [birth year] MRN: [id number]   Date of Service  03/31/2015  HPI/Events of Note  Hyperglycemia - blood glucose = 325.  eICU Interventions  Will change from ICU glucose protocol to Lantus 10 units now and Q day + sensitive Novolog SSI coverage AC/HS.     Intervention Category Intermediate Interventions: Hyperglycemia - evaluation and treatment  Sommer,Steven Dennard Nip 03/31/2015, 4:17 PM

## 2015-04-01 DIAGNOSIS — E119 Type 2 diabetes mellitus without complications: Secondary | ICD-10-CM | POA: Diagnosis present

## 2015-04-01 DIAGNOSIS — E876 Hypokalemia: Secondary | ICD-10-CM | POA: Diagnosis present

## 2015-04-01 DIAGNOSIS — E1110 Type 2 diabetes mellitus with ketoacidosis without coma: Secondary | ICD-10-CM | POA: Diagnosis present

## 2015-04-01 LAB — CBC
HEMATOCRIT: 39.3 % (ref 39.0–52.0)
HEMOGLOBIN: 12.9 g/dL — AB (ref 13.0–17.0)
MCH: 26.6 pg (ref 26.0–34.0)
MCHC: 32.8 g/dL (ref 30.0–36.0)
MCV: 81 fL (ref 78.0–100.0)
Platelets: 166 10*3/uL (ref 150–400)
RBC: 4.85 MIL/uL (ref 4.22–5.81)
RDW: 17.3 % — ABNORMAL HIGH (ref 11.5–15.5)
WBC: 6.5 10*3/uL (ref 4.0–10.5)

## 2015-04-01 LAB — BASIC METABOLIC PANEL
ANION GAP: 9 (ref 5–15)
BUN: <5 mg/dL — ABNORMAL LOW (ref 6–20)
CHLORIDE: 102 mmol/L (ref 101–111)
CO2: 23 mmol/L (ref 22–32)
Calcium: 8.3 mg/dL — ABNORMAL LOW (ref 8.9–10.3)
Creatinine, Ser: 0.46 mg/dL — ABNORMAL LOW (ref 0.61–1.24)
GFR calc Af Amer: >60 mL/min (ref >60)
GLUCOSE: 219 mg/dL — AB (ref 65–99)
POTASSIUM: 3.3 mmol/L — AB (ref 3.5–5.1)
Sodium: 134 mmol/L — ABNORMAL LOW (ref 135–145)

## 2015-04-01 LAB — GLUCOSE, CAPILLARY
GLUCOSE-CAPILLARY: 354 mg/dL — AB (ref 65–99)
GLUCOSE-CAPILLARY: 121 mg/dL — AB (ref 65–99)
Glucose-Capillary: 218 mg/dL — ABNORMAL HIGH (ref 65–99)
Glucose-Capillary: 248 mg/dL — ABNORMAL HIGH (ref 65–99)

## 2015-04-01 LAB — PHOSPHORUS: PHOSPHORUS: 3.1 mg/dL (ref 2.5–4.6)

## 2015-04-01 LAB — MAGNESIUM: Magnesium: 1.9 mg/dL (ref 1.7–2.4)

## 2015-04-01 MED ORDER — LORAZEPAM 1 MG PO TABS
0.0000 mg | ORAL_TABLET | Freq: Four times a day (QID) | ORAL | Status: DC
  Administered 2015-04-01: 1 mg via ORAL
  Filled 2015-04-01: qty 1

## 2015-04-01 MED ORDER — POTASSIUM CHLORIDE CRYS ER 20 MEQ PO TBCR
40.0000 meq | EXTENDED_RELEASE_TABLET | Freq: Once | ORAL | Status: DC
  Filled 2015-04-01: qty 2

## 2015-04-01 MED ORDER — LORAZEPAM 1 MG PO TABS: 0.0000 mg | ORAL_TABLET | Freq: Two times a day (BID) | ORAL | Status: DC

## 2015-04-01 MED ORDER — LORAZEPAM 2 MG/ML IJ SOLN: 0.0000 mg | Freq: Four times a day (QID) | INTRAMUSCULAR | Status: DC

## 2015-04-01 MED ORDER — INSULIN ASPART 100 UNIT/ML ~~LOC~~ SOLN: 0.0000 [IU] | Freq: Every day | SUBCUTANEOUS | Status: DC

## 2015-04-01 MED ORDER — POTASSIUM CHLORIDE CRYS ER 20 MEQ PO TBCR
40.0000 meq | EXTENDED_RELEASE_TABLET | Freq: Once | ORAL | Status: AC
  Administered 2015-04-01: 40 meq via ORAL
  Filled 2015-04-01: qty 2

## 2015-04-01 MED ORDER — LORAZEPAM 1 MG PO TABS: 1.0000 mg | ORAL_TABLET | Freq: Four times a day (QID) | ORAL | Status: DC | PRN

## 2015-04-01 MED ORDER — LORAZEPAM 2 MG/ML IJ SOLN: 1.0000 mg | Freq: Four times a day (QID) | INTRAMUSCULAR | Status: DC | PRN

## 2015-04-01 MED ORDER — LORAZEPAM 2 MG/ML IJ SOLN: 0.0000 mg | Freq: Two times a day (BID) | INTRAMUSCULAR | Status: DC

## 2015-04-01 MED ORDER — MAGNESIUM OXIDE 400 (241.3 MG) MG PO TABS
400.0000 mg | ORAL_TABLET | Freq: Two times a day (BID) | ORAL | Status: DC
  Administered 2015-04-01 – 2015-04-02 (×3): 400 mg via ORAL
  Filled 2015-04-01 (×3): qty 1

## 2015-04-01 MED ORDER — INSULIN ASPART 100 UNIT/ML ~~LOC~~ SOLN: 0.0000 [IU] | Freq: Once | SUBCUTANEOUS | Status: DC

## 2015-04-01 MED ORDER — INSULIN ASPART 100 UNIT/ML ~~LOC~~ SOLN
0.0000 [IU] | Freq: Three times a day (TID) | SUBCUTANEOUS | Status: DC
  Administered 2015-04-01: 15 [IU] via SUBCUTANEOUS
  Administered 2015-04-02: 3 [IU] via SUBCUTANEOUS

## 2015-04-01 MED ORDER — THIAMINE HCL 100 MG/ML IJ SOLN: 100.0000 mg | Freq: Every day | INTRAMUSCULAR | Status: DC

## 2015-04-01 MED ORDER — INSULIN ASPART 100 UNIT/ML ~~LOC~~ SOLN
0.0000 [IU] | Freq: Once | SUBCUTANEOUS | Status: AC
Start: 2015-04-01 — End: 2015-04-01
  Administered 2015-04-01: 5 [IU] via SUBCUTANEOUS

## 2015-04-01 MED ORDER — FOLIC ACID 1 MG PO TABS
1.0000 mg | ORAL_TABLET | Freq: Every day | ORAL | Status: DC
  Administered 2015-04-01 – 2015-04-02 (×2): 1 mg via ORAL
  Filled 2015-04-01 (×2): qty 1

## 2015-04-01 MED ORDER — ADULT MULTIVITAMIN W/MINERALS CH
1.0000 | ORAL_TABLET | Freq: Every day | ORAL | Status: DC
  Administered 2015-04-01 – 2015-04-02 (×2): 1 via ORAL
  Filled 2015-04-01 (×2): qty 1

## 2015-04-01 MED ORDER — LORAZEPAM 1 MG PO TABS: 0.0000 mg | ORAL_TABLET | Freq: Four times a day (QID) | ORAL | Status: DC

## 2015-04-01 MED ORDER — LORAZEPAM 1 MG PO TABS
0.0000 mg | ORAL_TABLET | Freq: Two times a day (BID) | ORAL | Status: DC
Start: 2015-04-03 — End: 2015-04-02

## 2015-04-01 MED ORDER — VITAMIN B-1 100 MG PO TABS
100.0000 mg | ORAL_TABLET | Freq: Every day | ORAL | Status: DC
  Administered 2015-04-02: 100 mg via ORAL
  Filled 2015-04-01: qty 1

## 2015-04-01 NOTE — Evaluation (Signed)
Physical Therapy Evaluation Patient Details Name: Jacob Schneider MRN: [id number] DOB: March 08, 1976 Today's Date: 04/01/2015   History of Present Illness  39 year old male with history of alcohol abuse who moved from [place] to [place] to be with family to help him with alcoholism and help him find a job. Last drink was Tuesday. He started becoming very confused and was brought to the ED where he became combative and multiple drugs were tried without success. Finally was placed on precedex and PCCM called to admit  Clinical Impression   Pt admitted with above diagnosis. Pt currently with functional limitations due to the deficits listed below (see PT Problem List).  Pt will benefit from skilled PT to increase their independence and safety with mobility to allow discharge to the venue listed below.       Follow Up Recommendations No PT follow up  Recommend Outpatient Ortho follow up for shoulder pain    Equipment Recommendations  None recommended by PT (perhaps cane)    Recommendations for Other Services Other (comment) (SW for Alcohol Cessation resources)     Precautions / Restrictions Precautions Precautions: Fall Precaution Comments: CIWA protocol      Mobility  Bed Mobility Overal bed mobility: Independent                Transfers Overall transfer level: Needs assistance Equipment used: None Transfers: Sit to/from Stand Sit to Stand: Supervision         General transfer comment: Supervision for safety; cues to wait for lines and leads to be secured  Ambulation/Gait Ambulation/Gait assistance: Supervision;Min guard Ambulation Distance (Feet): 300 Feet Assistive device: None (and pushing IV pole) Gait Pattern/deviations: Step-through pattern (occasional erratic step width)     General Gait Details: Overall showing good activiyt tolerance and gait pattern is approaching WNL; noted a few losses of balance warranting supervision with walking  Stairs             Wheelchair Mobility    Modified Rankin (Stroke Patients Only)       Balance Overall balance assessment: Needs assistance           Standing balance-Leahy Scale: Fair                               Pertinent Vitals/Pain Pain Assessment: 0-10 Pain Score: 8  Pain Location: R shoulder Pain Descriptors / Indicators: Aching Pain Intervention(s): Monitored during session;Patient requesting pain meds-RN notified    Home Living Family/patient expects to be discharged to:: Private residence   Available Help at Discharge: Family;Available PRN/intermittently Type of Home: House Home Access: Level entry     Home Layout: One level Home Equipment: None      Prior Function Level of Independence: Independent               Hand Dominance        Extremity/Trunk Assessment   Upper Extremity Assessment: Overall WFL for tasks assessed (Painful R shouder did not impede simple mobility tasks)           Lower Extremity Assessment: Overall WFL for tasks assessed      Cervical / Trunk Assessment: Normal  Communication   Communication: Prefers language other than English (Spanish)  Cognition Arousal/Alertness: Awake/alert Behavior During Therapy: WFL for tasks assessed/performed Overall Cognitive Status: Within Functional Limits for tasks assessed (for simple mobility)  General Comments      Exercises        Assessment/Plan    PT Assessment Patient needs continued PT services  PT Diagnosis Difficulty walking (balance dysfunction)   PT Problem List Decreased activity tolerance;Decreased balance  PT Treatment Interventions Gait training;Stair training;Balance training   PT Goals (Current goals can be found in the Care Plan section) Acute Rehab PT Goals Patient Stated Goal: did not state PT Goal Formulation: With patient Time For Goal Achievement: 04/08/15 (Will likely meet goals next session) Potential to Achieve  Goals: Good    Frequency Min 2X/week   Barriers to discharge        Co-evaluation               End of Session   Activity Tolerance: Patient tolerated treatment well Patient left: in chair;with call bell/phone within reach;with chair alarm set Nurse Communication: Mobility status         Time: 1203-1230 PT Time Calculation (min) (ACUTE ONLY): 27 min   Charges:   PT Evaluation $Initial PT Evaluation Tier I: 1 Procedure PT Treatments $Gait Training: 8-22 mins   PT G CodesOlen Pel 04/01/2015, 1:18 PM  Van Clines, [place]  Acute Rehabilitation Services Pager [phone] Office [phone]

## 2015-04-01 NOTE — Progress Notes (Signed)
[facility]                                                                              Patient Demographics  Jacob Schneider, is a 39 y.o. male, DOB - Jun 24, 1976, ZOX:[id number]  Admit date - 03/30/2015   Admitting Physician Alyson Reedy, MD  Outpatient Primary MD for the patient is No primary care provider on file.  LOS - 2   Chief Complaint  Patient presents with  . Delirium Tremens (DTS)       Brief HPI   39 year old male new to [place] from [place]. ETOH abuse history. Last drink 9/20. Initially family hoped he could withdrawal at home, but confusion and hallucinations progressed and he presented to ED 9/26 with these complaints. Agitation was refractory to benzodiazepines, anti-psychotics, and ketamine in ED. patient was admitted by critical care service to ICU on precedex. Patient transferred to provide hospitalist service on 9/28.   Assessment & Plan    Principal problem   Alcohol withdrawal delirium, acute, hyperactive - Off Precedex - Still somewhat tremulous, continue IV Ativan per CIWA protocol - Continue thiamine, folic acid, IV fluids - Last drink 9/20. Social work consulted for providing resources for alcohol cessation rehabs.  Hyperglycemia - Obtain hemoglobin A1c - Placed on moderate sliding scale, night coverage - Patient will need to be on oral hypoglycemics at the time of discharge   Hypokalemia, hypomagnesemia  - Likely due to #1, replaced   Code Status: Full CODE STATUS  Family Communication: Discussed in detail with the patient, all imaging results, lab results explained to the patient    Disposition Plan: DC tomorrow is significantly improved and able to wean down Ativan   Time Spent in minutes  25 minutes  Procedures  None   Consults   Patient was admitted by critical care service   DVT Prophylaxis  SCDs   Medications  Scheduled Meds: . antiseptic oral rinse  7 mL Mouth Rinse BID  . insulin aspart  0-5 Units  Subcutaneous QHS  . insulin aspart  0-9 Units Subcutaneous TID WC  . insulin glargine  10 Units Subcutaneous QHS  . LORazepam  0-4 mg Intravenous Q12H  . LORazepam  0-4 mg Intravenous 4 times per day  . pantoprazole (PROTONIX) IV  40 mg Intravenous Q24H  . thiamine  100 mg Intravenous Daily  . thiamine  100 mg Oral Daily   Continuous Infusions: . sodium chloride 100 mL/hr at 04/01/15 0128   PRN Meds:.acetaminophen, zolpidem   Antibiotics   Anti-infectives    None        Subjective:   Jacob Schneider was seen and examined today.   still somewhat tremulous otherwise denies any pain. Patient denies dizziness, chest pain, shortness of breath, abdominal pain, N/V/D/C, new weakness, numbess, tingling. No acute events overnight.  CIWA 5 at 6 AM, 7 at 8 AM.   Objective:   Blood pressure 135/88, pulse 82, temperature 98.8 F (37.1 C), temperature source Oral, resp. rate 20, height  (1.651 m), weight 80.7 kg (177 lb 14.6 oz), SpO2 98 %.  Wt Readings from Last 3 Encounters:  04/01/15 80.7 kg (177 lb 14.6 oz)     Intake/Output Summary (Last 24 hours) at 04/01/15 1202 Last data filed at 04/01/15 0900  Gross per 24 hour  Intake   2120 ml  Output   3800 ml  Net  -1680 ml    Exam  General: Alert and oriented x 3, NAD  HEENT:  PERRLA, EOMI, Anicteric Sclera, mucous membranes moist.   Neck: Supple, no JVD, no masses  CVS: S1 S2 auscultated, no rubs, murmurs or gallops. Regular rate and rhythm.  Respiratory: Clear to auscultation bilaterally, no wheezing, rales or rhonchi  Abdomen: Soft, nontender, nondistended, + bowel sounds  Ext: no cyanosis clubbing or edema, still somewhat tremulous   Neuro: AAOx3, Cr N's II- XII. Strength 5/5 upper and lower extremities bilaterally  Skin: No rashes  Psych: Normal affect and demeanor, alert and oriented x3    Data Review   Micro Results Recent Results (from the past 240 hour(s))  MRSA PCR Screening     Status: None    Collection Time: 03/30/15  8:22 PM  Result Value Ref Range Status   MRSA by PCR NEGATIVE NEGATIVE Final    Comment:        The GeneXpert MRSA Assay (FDA approved for NASAL specimens only), is one component of a comprehensive MRSA colonization surveillance program. It is not intended to diagnose MRSA infection nor to guide or monitor treatment for MRSA infections.     Radiology Reports US Abdomen Complete  03/30/2015   CLINICAL DATA:  Upper abdominal pain  EXAM: ULTRASOUND ABDOMEN COMPLETE  COMPARISON:  None.  FINDINGS: Gallbladder: No gallstones or wall thickening visualized. No sonographic Murphy sign noted.  Common bile duct: Diameter: 5.1 mm  Liver: No focal lesion identified. Increase in parenchymal echogenicity.  IVC: No abnormality visualized.  Pancreas: Visualized portion unremarkable.  Spleen: Size and appearance within normal limits.  Right Kidney: Length: 11.7 cm. Echogenicity within normal limits. No mass or hydronephrosis visualized.  Left Kidney: Length: 11.5 cm. Echogenicity within normal limits. No mass or hydronephrosis visualized.  Abdominal aorta: No aneurysm visualized.  Other findings: None.  IMPRESSION: 1. No acute findings. 2. Increased parenchymal echogenicity suggestive of hepatic steatosis.   Electronically Signed   By: Signa Kell M.D.   On: 03/30/2015 14:18   Dg Chest Port 1 View  03/31/2015   CLINICAL DATA:  Delay area, alcohol withdrawal  EXAM: PORTABLE CHEST - 1 VIEW  COMPARISON:  03/30/2015  FINDINGS: Cardiac shadow is within normal limits. The lungs are clear bilaterally. No acute bony abnormality is seen.  IMPRESSION: No active disease.   Electronically Signed   By: Alcide Clever M.D.   On: 03/31/2015 07:48   Dg Chest Port 1 View  03/30/2015   CLINICAL DATA:  Delirium.  Alcohol withdrawal.  EXAM: PORTABLE CHEST 1 VIEW  COMPARISON:  None.  FINDINGS: Low volume chest. Cardiopericardial silhouette within normal limits. Mediastinal contours normal. Trachea  midline. No airspace disease or effusion. Monitoring leads project over the chest.  IMPRESSION: No active disease.   Electronically Signed   By: Andreas Newport M.D.   On: 03/30/2015 20:03    CBC  Recent Labs Lab 03/30/15 0931 03/30/15 1548 03/31/15 0219 04/01/15 0615  WBC 7.4 6.9 6.8 6.5  HGB 13.9 12.4* 12.5* 12.9*  HCT 42.1 38.3* 39.3 39.3  PLT 175 170 152 166  MCV 81.1 80.8 82.7 81.0  MCH 26.8 26.2 26.3 26.6  MCHC 33.0 32.4 31.8 32.8  RDW 17.6* 17.6*  17.9* 17.3*  LYMPHSABS  --  1.5  --   --   MONOABS  --  1.1*  --   --   EOSABS  --  0.1  --   --   BASOSABS  --  0.1  --   --     Chemistries   Recent Labs Lab 03/30/15 0931 03/30/15 1548 03/31/15 0219 04/01/15 0615  NA 134* 136 138 134*  K 4.0 3.5 3.0* 3.3*  CL 98* 103 106 102  CO2 GLUCOSE 444* 253* 152* 219*  BUN <5*  CREATININE 0.96 0.68 0.54* 0.46*  CALCIUM 9.4 8.7* 8.4* 8.3*  MG  --  1.8 2.1 1.9  AST 39 39  --   --   ALT 50 43  --   --   ALKPHOS 93 69  --   --   BILITOT 0.8 0.6  --   --    ------------------------------------------------------------------------------------------------------------------ estimated creatinine clearance is 121.3 mL/min (by C-G formula based on Cr of 0.46). ------------------------------------------------------------------------------------------------------------------ No results for input(s): HGBA1C in the last 72 hours. ------------------------------------------------------------------------------------------------------------------ No results for input(s): CHOL, HDL, LDLCALC, TRIG, CHOLHDL, LDLDIRECT in the last 72 hours. ------------------------------------------------------------------------------------------------------------------ No results for input(s): TSH, T4TOTAL, T3FREE, THYROIDAB in the last 72 hours.  Invalid input(s): FREET3 ------------------------------------------------------------------------------------------------------------------ No  results for input(s): VITAMINB12, FOLATE, FERRITIN, TIBC, IRON, RETICCTPCT in the last 72 hours.  Coagulation profile  Recent Labs Lab 03/30/15 1548  INR 1.10    No results for input(s): DDIMER in the last 72 hours.  Cardiac Enzymes No results for input(s): CKMB, TROPONINI, MYOGLOBIN in the last 168 hours.  Invalid input(s): CK ------------------------------------------------------------------------------------------------------------------ Invalid input(s): POCBNP   Recent Labs  03/31/15 1124 03/31/15 1606 03/31/15 2004 03/31/15 2148 04/01/15 0741 04/01/15 1117  GLUCAP 79 325* 319* 355* 218* 248*     RAI,RIPUDEEP M.D. [facility] 04/01/2015, 12:02 PM  Pager: [phone] Between 7am to 7pm - call Pager - [phone]  After 7pm go to [email or web address] - password [email or web address]  Call night coverage person covering after 7pm

## 2015-04-01 NOTE — Progress Notes (Signed)
   Results for VIRGAL, WARMUTH (MRN [id number]) as of 04/01/2015 15:05  Ref. Range 03/31/2015 16:06 03/31/2015 20:04 03/31/2015 21:48 04/01/2015 07:41 04/01/2015 11:17  Glucose-Capillary Latest Ref Range: 65-99 mg/dL 409 (H) 811 (H) 914 (H) 218 (H) 248 (H)   Blood sugars continue to be elevated.  Awaiting Hgba1C results.  Recommendations: Increase Lantus to 15 units QHS.  Will continue to follow. Thank you. Ailene Ards, RD, LDN, CDE Inpatient Diabetes Coordinator [phone]

## 2015-04-02 DIAGNOSIS — E1310 Other specified diabetes mellitus with ketoacidosis without coma: Secondary | ICD-10-CM

## 2015-04-02 LAB — BASIC METABOLIC PANEL
ANION GAP: 9 (ref 5–15)
BUN: <5 mg/dL — ABNORMAL LOW (ref 6–20)
CHLORIDE: 102 mmol/L (ref 101–111)
CO2: 24 mmol/L (ref 22–32)
CREATININE: 0.44 mg/dL — AB (ref 0.61–1.24)
Calcium: 8.5 mg/dL — ABNORMAL LOW (ref 8.9–10.3)
GFR calc non Af Amer: >60 mL/min (ref >60)
Glucose, Bld: 192 mg/dL — ABNORMAL HIGH (ref 65–99)
POTASSIUM: 3.4 mmol/L — AB (ref 3.5–5.1)
SODIUM: 135 mmol/L (ref 135–145)

## 2015-04-02 LAB — HEMOGLOBIN A1C
Hgb A1c MFr Bld: 10.9 % — ABNORMAL HIGH (ref 4.8–5.6)
Mean Plasma Glucose: 266 mg/dL

## 2015-04-02 LAB — GLUCOSE, CAPILLARY
GLUCOSE-CAPILLARY: 188 mg/dL — AB (ref 65–99)
GLUCOSE-CAPILLARY: 178 mg/dL — AB (ref 65–99)

## 2015-04-02 MED ORDER — GLIMEPIRIDE 2 MG PO TABS: 2.0000 mg | ORAL_TABLET | Freq: Every day | ORAL | Status: DC

## 2015-04-02 MED ORDER — POTASSIUM CHLORIDE CRYS ER 20 MEQ PO TBCR
40.0000 meq | EXTENDED_RELEASE_TABLET | Freq: Once | ORAL | Status: AC
  Administered 2015-04-02: 40 meq via ORAL
  Filled 2015-04-02: qty 2

## 2015-04-02 MED ORDER — TRAMADOL HCL 50 MG PO TABS: 50.0000 mg | ORAL_TABLET | Freq: Four times a day (QID) | ORAL | Status: DC | PRN

## 2015-04-02 MED ORDER — NAPROXEN 250 MG PO TABS: 250.0000 mg | ORAL_TABLET | Freq: Two times a day (BID) | ORAL | Status: DC

## 2015-04-02 MED ORDER — THIAMINE HCL 100 MG PO TABS
100.0000 mg | ORAL_TABLET | Freq: Every day | ORAL | Status: DC
Start: 2015-04-02 — End: 2017-07-06

## 2015-04-02 MED ORDER — LIVING WELL WITH DIABETES BOOK - IN SPANISH
Freq: Once | Status: AC
  Administered 2015-04-02: 1
  Filled 2015-04-02: qty 1

## 2015-04-02 MED ORDER — MAGNESIUM OXIDE 400 (241.3 MG) MG PO TABS: 400.0000 mg | ORAL_TABLET | Freq: Two times a day (BID) | ORAL | Status: DC

## 2015-04-02 MED ORDER — NAPROXEN 250 MG PO TABS
250.0000 mg | ORAL_TABLET | Freq: Two times a day (BID) | ORAL | Status: DC
  Administered 2015-04-02: 250 mg via ORAL
  Filled 2015-04-02 (×3): qty 1

## 2015-04-02 MED ORDER — METFORMIN HCL 1000 MG PO TABS: 1000.0000 mg | ORAL_TABLET | Freq: Two times a day (BID) | ORAL | Status: DC

## 2015-04-02 MED ORDER — BLOOD GLUCOSE MONITOR KIT: PACK | Status: DC

## 2015-04-02 MED ORDER — TRAMADOL HCL 50 MG PO TABS
50.0000 mg | ORAL_TABLET | Freq: Four times a day (QID) | ORAL | Status: DC | PRN
  Administered 2015-04-02: 50 mg via ORAL
  Filled 2015-04-02: qty 1

## 2015-04-02 MED ORDER — PREDNISONE 20 MG PO TABS
40.0000 mg | ORAL_TABLET | Freq: Every day | ORAL | Status: DC
Start: 2015-04-02 — End: 2015-04-02

## 2015-04-02 MED ORDER — FOLIC ACID 1 MG PO TABS: 1.0000 mg | ORAL_TABLET | Freq: Every day | ORAL | Status: DC

## 2015-04-02 MED ORDER — PANTOPRAZOLE SODIUM 40 MG PO TBEC
40.0000 mg | DELAYED_RELEASE_TABLET | Freq: Every day | ORAL | Status: DC
  Administered 2015-04-02: 40 mg via ORAL

## 2015-04-02 MED ORDER — LORAZEPAM 1 MG PO TABS: 1.0000 mg | ORAL_TABLET | Freq: Two times a day (BID) | ORAL | Status: DC

## 2015-04-02 MED ORDER — PANTOPRAZOLE SODIUM 40 MG PO TBEC: 40.0000 mg | DELAYED_RELEASE_TABLET | Freq: Every day | ORAL | Status: DC

## 2015-04-02 NOTE — Progress Notes (Signed)
Pt being discharged home via wheelchair with family. Pt alert and oriented x4. VSS. Pt c/o no pain at this time. No signs of respiratory distress. Education complete and care plans resolved. IV removed with catheter intact and pt tolerated well. No further issues at this time. Pt to follow up with PCP. Stone,Heather R, RN 

## 2015-04-02 NOTE — Discharge Summary (Signed)
Physician Discharge Summary   Patient ID: Jacob Schneider MRN: [id number] DOB/AGE: 10-14-1975 39 y.o.  Admit date: 03/30/2015 Discharge date: 04/02/2015  Primary Care Physician:  No primary care provider on file.  Discharge Diagnoses:    . Alcohol withdrawal delirium, acute, hyperactive with the delirium tremens  . DM (diabetes mellitus) type 2, uncontrolled, new diagnosis  . Hypokalemia . Hypomagnesemia  Consults: Patient was admitted by critical care service   Recommendations for Outpatient Follow-up:  Please note patient has new diagnosis of diabetes mellitus. He was discharged on metformin 1000 mg twice a day with Amaryl 2 mg daily.  Please follow hemoglobin A1c, CBGs and adjust medications as needed  TESTS THAT NEED FOLLOW-UP Hemoglobin A1c, screening lipid panel   DIET: Carb modified diet    Allergies:  No Known Allergies   Discharge Medications:   Medication List    TAKE these medications        blood glucose meter kit and supplies Kit  Dispense based on patient and insurance preference. Use up to four times daily as directed. (FOR ICD-9 250.00, 250.01).     folic acid 1 MG tablet  Commonly known as:  FOLVITE  Take 1 tablet (1 mg total) by mouth daily.     glimepiride 2 MG tablet  Commonly known as:  AMARYL  Take 1 tablet (2 mg total) by mouth daily with breakfast.     LORazepam 1 MG tablet  Commonly known as:  ATIVAN  Take 1 tablet (1 mg total) by mouth every 12 (twelve) hours. Take twice a day 2 days for shakiness/anxiety, then as needed     magnesium oxide 400 (241.3 MG) MG tablet  Commonly known as:  MAG-OX  Take 1 tablet (400 mg total) by mouth 2 (two) times daily. X 1 week     metFORMIN 1000 MG tablet  Commonly known as:  GLUCOPHAGE  Take 1 tablet (1,000 mg total) by mouth 2 (two) times daily with a meal.     naproxen 250 MG tablet  Commonly known as:  NAPROSYN  Take 1 tablet (250 mg total) by mouth 2 (two) times daily with a meal. X 1  week     pantoprazole 40 MG tablet  Commonly known as:  PROTONIX  Take 1 tablet (40 mg total) by mouth daily. Any generic PPI available is okay     thiamine 100 MG tablet  Take 1 tablet (100 mg total) by mouth daily.     traMADol 50 MG tablet  Commonly known as:  ULTRAM  Take 1 tablet (50 mg total) by mouth every 6 (six) hours as needed for moderate pain.         Brief H and P: For complete details please refer to admission H and P, but in brief 39 year old male new to [place] from [place]. ETOH abuse history. Last drink 9/20. Initially family hoped he could withdrawal at home, but confusion and hallucinations progressed and he presented to ED 9/26 with these complaints. Agitation was refractory to benzodiazepines, anti-psychotics, and ketamine in ED. patient was admitted by critical care service to ICU on precedex. Patient transferred to provide hospitalist service on 9/28.  Hospital Course:  Alcohol withdrawal delirium, acute, hyperactive Patient presented to ED on 9/26 with acute alcohol withdrawals. Patient's delerium and agitation was refractory to benzodiazepine use, anti-psychotics and ketamine in the ED. Hence critical care service was consulted and patient was admitted to intensive care unit on Precedex drip which was discontinued on  9/27. He was transferred to hospitalist service on 9/28. Patient was continued on Ativan per CIWA protocol. He is doing significantly well, social work consult was obtained to give resources to him for alcohol rehabilitation. Patient was continued on thiamine, folic acid and MVI. He was strongly recommended alcohol cessation.  Hyperglycemia with new onset diabetes mellitus Hemoglobin A1c 10.9. The patient had presented with glucose of 444 at the time of admission. Patient was placed on Lantus, meal coverage and sliding scale insulin while inpatient. He received extensive teaching from the diabetic coordinator. Patient was given the prescription for  glucometer. He was placed on oral metformin and Amaryl at the time of discharge. He will need close follow-up appointment with PCP, community wellness center appointment was scheduled for the patient.  Hypokalemia, hypomagnesemia  - Likely due to #1, replaced   Costochondritis: At the time of discharge, patient reported chest wall pain on the right, sore and tender to touch, patient was placed on Naprosyn and tramadol as needed for pain   Day of Discharge BP 141/81 mmHg  Pulse 102  Temp(Src) 97.6 F (36.4 C) (Oral)  Resp 18  Ht 5' 5"  (1.651 m)  Wt 78.926 kg (174 lb)  BMI 28.96 kg/m2  SpO2 100%  Physical Exam: General: Alert and awake oriented x3 not in any acute distress. HEENT: anicteric sclera, pupils reactive to light and accommodation CVS: S1-S2 clear no murmur rubs or gallops Chest: clear to auscultation bilaterally, no wheezing rales or rhonchi Abdomen: soft nontender, nondistended, normal bowel sounds Extremities: no cyanosis, clubbing or edema noted bilaterally Neuro: Cranial nerves II-XII intact, no focal neurological deficits   The results of significant diagnostics from this hospitalization (including imaging, microbiology, ancillary and laboratory) are listed below for reference.    LAB RESULTS: Basic Metabolic Panel:  Recent Labs Lab 04/01/15 0615 04/02/15 0416  NA 134* 135  K 3.3* 3.4*  CL 102 102  CO2 23 24  GLUCOSE 219* 192*  BUN <5* <5*  CREATININE 0.46* 0.44*  CALCIUM 8.3* 8.5*  MG 1.9  --   PHOS 3.1  --    Liver Function Tests:  Recent Labs Lab 03/30/15 0931 03/30/15 1548  AST 39 39  ALT 50 43  ALKPHOS 93 69  BILITOT 0.8 0.6  PROT 7.2 6.3*  ALBUMIN 4.1 3.7    Recent Labs Lab 03/30/15 1548  LIPASE 35   No results for input(s): AMMONIA in the last 168 hours. CBC:  Recent Labs Lab 03/30/15 1548 03/31/15 0219 04/01/15 0615  WBC 6.9 6.8 6.5  NEUTROABS 4.2  --   --   HGB 12.4* 12.5* 12.9*  HCT 38.3* 39.3 39.3  MCV 80.8  82.7 81.0  PLT 170 152 166   Cardiac Enzymes: No results for input(s): CKTOTAL, CKMB, CKMBINDEX, TROPONINI in the last 168 hours. BNP: Invalid input(s): POCBNP CBG:  Recent Labs Lab 04/01/15 2046 04/02/15 0750  GLUCAP 121* 188*    Significant Diagnostic Studies:  US Abdomen Complete  03/30/2015   CLINICAL DATA:  Upper abdominal pain  EXAM: ULTRASOUND ABDOMEN COMPLETE  COMPARISON:  None.  FINDINGS: Gallbladder: No gallstones or wall thickening visualized. No sonographic Murphy sign noted.  Common bile duct: Diameter: 5.1 mm  Liver: No focal lesion identified. Increase in parenchymal echogenicity.  IVC: No abnormality visualized.  Pancreas: Visualized portion unremarkable.  Spleen: Size and appearance within normal limits.  Right Kidney: Length: 11.7 cm. Echogenicity within normal limits. No mass or hydronephrosis visualized.  Left Kidney: Length: 11.5 cm. Echogenicity  within normal limits. No mass or hydronephrosis visualized.  Abdominal aorta: No aneurysm visualized.  Other findings: None.  IMPRESSION: 1. No acute findings. 2. Increased parenchymal echogenicity suggestive of hepatic steatosis.   Electronically Signed   By: Kerby Moors M.D.   On: 03/30/2015 14:18   Dg Chest Port 1 View  03/31/2015   CLINICAL DATA:  Delay area, alcohol withdrawal  EXAM: PORTABLE CHEST - 1 VIEW  COMPARISON:  03/30/2015  FINDINGS: Cardiac shadow is within normal limits. The lungs are clear bilaterally. No acute bony abnormality is seen.  IMPRESSION: No active disease.   Electronically Signed   By: Inez Catalina M.D.   On: 03/31/2015 07:48   Dg Chest Port 1 View  03/30/2015   CLINICAL DATA:  Delirium.  Alcohol withdrawal.  EXAM: PORTABLE CHEST 1 VIEW  COMPARISON:  None.  FINDINGS: Low volume chest. Cardiopericardial silhouette within normal limits. Mediastinal contours normal. Trachea midline. No airspace disease or effusion. Monitoring leads project over the chest.  IMPRESSION: No active disease.    Electronically Signed   By: Dereck Ligas M.D.   On: 03/30/2015 20:03    2D ECHO:   Disposition and Follow-up:     Discharge Instructions    Diet - low sodium heart healthy    Complete by:  As directed      Discharge instructions    Complete by:  As directed   It is VERY IMPORTANT that you follow up with a PCP on a regular basis.  Check your blood glucoses before each meal and at bedtime and maintain a log of your readings.  Bring this log with you when you follow up with your PCP so that he or she can adjust your medications at your follow up visit.  Please take ativan for shakiness and anxiety two times a day for 2 days, then as needed after that.     Increase activity slowly    Complete by:  As directed             DISPOSITION: Home   DISCHARGE FOLLOW-UP Follow-up Information    Follow up with [facility]    .   Why:  Appointment :Monday, April 06, 2015 at 9 am. Please call if you need to reschedule this appointment.    Contact information:   [address] [place] [address] [phone]       Time spent on Discharge: 35 minutes  Signed:   Ridgely Anastacio M.D. [facility] 04/02/2015, 12:27 PM Pager: [phone]

## 2015-04-02 NOTE — Discharge Instructions (Signed)
Sndrome de abstinencia alcohlica (Alcohol Withdrawal) Cuando el consumo de drogas interfiere con las actividades normales de la vida se convierte en abuso. Esto incluye problemas con la familia y los amigos. La dependencia psicolgica existe cuando su mente le dice que necesita la droga. Esto generalmente es seguido por la dependencia fsica, que se produce al aumentar de [employer] continua la cantidad de droga necesaria para Systems analyst mismo efecto o el estado de "euforia". Esto se conoce como adiccin o dependencia qumica. El riesgo de [place] persona es mucho mayor si existen antecedentes de dependencia de qumicos en la familia. Sndrome de abstinencia leve al abandonar el consumo de alcohol cuando se ha desarrollado la adiccin o la dependencia qumica. Cuando una persona desarrolla la tolerancia al alcohol y de modo repentino deja de consumirlo, puede sufrir sntomas fsicos molestos. Geralmente son leves y Art therapist en temblores en las manos y aumento de la frecuencia cardiaca, de la respiracin y de Retail buyer. Algunas veces estos sntomas se asocian a una sensacin de ansiedad, crisis de [place] y pesadillas. Tambin Lobbyist de [place]. Los patrones normales de sueo generalmente se interrumpen con perodos de insomnio (imposibilidad para dormir). Estos sntomas pueden tener una duracin de 6 meses. Debido a estas molestias, muchas personas eligen continuar bebiendo para evitarlas y para tratar de sentirse normal.  Sndrome de abstinencia grave ante la disminucin de la ingesta o sin consumo de alcohol, cuando se ha desarrollado la adiccin o la dependencia qumica. Alrededor del cinco por ciento de los alcohlicos desarrollan signos de abstinencia grave cuando interrumpen el consumo de alcohol. Uno de los signos es la aparicin de convulsiones generalizadas Otros signos son agitacin grave y confusin. Esto puede asociarse a las ideas delirantes y alucinaciones (la creencia en cosas  que no son reales o ver cosas que no existen). Si la ingesta de alcohol se realiz durante un tiempo prolongado, puede haber deficiencias vitamnicas. Generalmente el tratamiento para esto requiere la hospitalizacin y un seguimiento intenso. Slo se puede ayudar a una persona que padece una adiccin si suspende el consumo de todas las sustancias qumicas. Esto es difcil de llevar a cabo, pero puede salvar su vida. Algunas consecuencias probables del consumo continuo de alcohol son la prdida de la autoestima, la violencia y Musician. La adiccin no puede curarse pero puede detenerse. A menudo esto requiere ayuda externa y la atencin profesional. Los centros de tratamiento estn enumerados en las pginas amarillas bajo el rubro: [place], Narcticos y Alcohlicos Annimos. Casi todos los hospitales y clnicas pueden derivarlo a un centro de atencin especializada. No es necesario que usted soporte los sntomas desagradables de la abstinencia. El profesional que lo asiste puede proporcionarle la medicacin que lo ayudar a Engineer, agricultural este perodo difcil. Trate de evitar las situaciones, los amigos o las drogas que hicieron posible que usted consumiera alcohol en el pasado. Aprenda a decir que no. Lleva un largo tiempo superar las adicciones a cualquier droga, incluso al alcohol. Puede haber momentos en los que usted sienta que necesita beber. Despus de liberarse de la adiccin fsica y del sndrome de abstinencia, sentir una disminucin en el deseo intenso que le indica que usted necesita alcohol para sentirse normal. Comunquese con el profesional que lo asiste si necesita ms apoyo Aprenda a Public house manager con quien debe hablar dentro de su familia y entre sus [place], de modo que durante estos perodos pueda recibir ayuda externa. AA (Alcohlicos Annimos) ha ayudado a Agricultural engineer. Para obtener ms ayuda  contctese con AA o comunquese con el profesional que lo asiste, consejero o  sacerdote. Al-Anon y Alateen son grupos de ayuda para amigos y familiares de Optometrist. Las [employer] aman y cuidan a los adictos al alcohol tambin [place]. Para obtener informacin acerca de estas organizaciones, bsquelas en la gua telefnica de su localidad o llame a un centro local para el tratamiento del alcoholismo.  SOLICITE ATENCIN MDICA DE INMEDIATO SI:  Sufre convulsiones.  Sube la fiebre.  Sufre vmitos incontrolables o vomita sangre. Puede ser sangre de color rojo brillante o similar a borra de caf.  Maxie Better en la materia fecal. Puede ser de color rojo brillante o de aspecto alquitranado, con olor ftido.  Si se siente confundido o desfalleciente. No conduzca si se siente de [employer]. Deje que alguien conduzca por usted o llame al 911 para solicitar ayuda.  Se torna agitado o presenta un estado de confusin.  Desarrolla una ansiedad incontrolable.  Los pacientes pueden sufrir alucinaciones (ver, Tax adviser o sentir cosas que en realidad no existen). El profesional que lo asiste ha determinado que usted comprende plenamente su problema mdico y que su estado mental ha vuelto a la normalidad Comprende que ha recibido un tratamiento para el sndrome de abstinencia por alcohol, ha aceptado no beber alcohol al menos por un da, que no conducir un automvil ni manipular maquinarias durante al menos 24 horas y que ha tenido la oportunidad de formular todas las preguntas necesarias acerca de su problema. Document Released: 06/20/2005 Document Revised: 09/12/2011 [facility] Patient Information 2015 [place], [place]. This information is not intended to replace advice given to you by your health care provider. Make sure you discuss any questions you have with your health care provider. Control del nivel de glucosa en la sangre (Blood Glucose Monitoring) El control de la glucosa en la sangre (tambin llamada azcar en la sangre) lo ayudar a tener la diabetes  bajo control. Tambin ayuda a que usted y Lexicographer la diabetes y determinen si el tratamiento es Engineer, manufacturing. POR QU HAY QUE CONTROLAR LA GLUCOSA EN LA SANGRE?  Esto puede ayudar a comprender de [employer], la actividad fsica y los medicamentos inciden en los niveles de [place].  Le permite conocer el nivel de glucosa en la sangre en cualquier momento dado. Puede saber rpidamente si el nivel es bajo (hipoglucemia) o alto (hiperglucemia).  Puede ser de ayuda para que usted y el mdico sepan cmo Presenter, broadcasting,  y para entender cmo controlar una enfermedad o ajustar los medicamentos para hacer ejercicio. CUNDO DEBE HACERSE LAS PRUEBAS? El mdico lo ayudar a decidir con qu frecuencia deber [employer] niveles de glucosa en la [place]. Esto puede depender del tipo de diabetes que tenga, su control de la diabetes o los tipos de medicamentos que tome. Asegrese de anotar todos los valores de la glucosa en la [place], de modo que esta informacin pueda ser revisada por su mdico. A continuacin puede ver ejemplos de los momentos para Education officer, environmental la prueba que el mdico puede Neurosurgeon. Diabetes tipo1  [employer] prueba 4 veces por da si est bien controlado, [place] una bomba de [place] o se aplica muchas inyecciones diarias.  Si la diabetes no est bien controlada o si est enfermo, puede ser necesario que se controle con ms frecuencia.  Es recomendable que tambin se controle de [place] modo:  Antes y despus de hacer ejercicio.  Entre las comidas y 2horas despus de Arts administrator.  Ocasionalmente, entre las 2:00a.m. y las 3:00a.m. Diabetes tipo2  Puede ser diferente para cada persona, pero, en general, si recibe insulina, hgase la prueba 4veces por da.  Si toma medicamentos por boca (va oral), hgase la prueba 2veces por da.  Si sigue una dieta controlada, hgase la prueba una vez por da.  Si la diabetes no est bien controlada o si est enfermo, puede  ser necesario que se controle con ms frecuencia. CMO CONTROLAR EL NIVEL DE GLUCOSA EN LA SANGRE Insumos necesarios  Medidor de glucosa en la sangre.  Tiras reactivas para el medidor. Cada medidor tiene sus propias tiras reactivas. Debe usar las tiras reactivas correspondientes a su medidor.  Una aguja para pinchar (lanceta).  Un dispositivo que sujeta la lanceta (dispositivo de puncin).  Un diario o libro de anotaciones para [employer]. Procedimiento  Lave sus manos con agua y [place]. No se recomienda usar alcohol.  Pnchese el costado del dedo (no la punta) con Optometrist.  Apriete suavemente el dedo hasta que aparezca una pequea gota de [place].  Siga las instrucciones que vienen con el medidor para Public affairs consultant tira Firefighter, Contractor la sangre sobre la tira y usar el medidor de Horticulturist, commercial. Otras zonas de las que se puede tomar sangre para la prueba Algunos medidores le permiten tomar sangre para la prueba de otras zonas del cuerpo (que no son el dedo). Estas reas se llaman sitios alternativos. Los sitios alternativos ms comunes son los siguientes:  El Product manager.  El muslo.  La zona posterior de la parte inferior de la pierna.  La palma de la mano. El flujo de sangre en estas zonas es ms lento. Por lo tanto, los valores de glucosa en la sangre que obtenga pueden estar demorados, y los nmeros son diferentes de los que obtiene de los dedos. No saque sangre de sitios alternativos si cree que tiene hipoglucemia. Los valores no sern precisos. Siempre extraiga del dedo si tiene hipoglucemia. Adems, si no puede darse cuenta cuando tiene bajos los niveles (hipoglucemia asintomtica), siempre extraiga sangre de los dedos para los controles de glucosa en la [place]. CONSEJOS ADICIONALES PARA EL CONTROL DE LA GLUCOSA  No vuelva a utilizar las lancetas.  Siempre tenga los insumos a mano.  Todos los medidores de glucosa incluyen un nmero de telfono "directo",  disponible las 24 horas, al que podr llamar si tiene preguntas o [place].  Ajuste (calibre) el medidor de glucosa con una solucin de control despus de terminar algunas cajas de tiras reactivas. LLEVE REGISTROS DE LOS NIVELES DE GLUCOSA EN LA SANGRE Es recomendable llevar un diario o un registro de los valores de glucosa en la [place]. La [employer] de los medidores de glucosa, sino todos, conservan el registro de la glucosa en el dispositivo. Algunos medidores permiten descargar los registros a su computadora. Llevar un registro de los valores de glucosa en la sangre es especialmente til si desea observar los patrones. Haga anotaciones simultneas con la Microbiologist de los valores de glucosa en la sangre debido a que podra olvidar lo que ocurri en el momento exacto. Llevar un buen registro los ayudar a usted y al mdico a Printmaker juntos para Personnel officer un buen control de la diabetes.  Document Released: 06/20/2005 Document Revised: 11/04/2013 [facility] Patient Information 2015 [place], [place]. This information is not intended to replace advice given to you by your health care provider. Make sure you discuss any questions you have with your health care provider.

## 2015-04-02 NOTE — Progress Notes (Signed)
Inpatient Diabetes Program Recommendations  AACE/ADA: New Consensus Statement on Inpatient Glycemic Control (2015)  Target Ranges:  Prepandial:   less than 140 mg/dL      Peak postprandial:   less than 180 mg/dL (1-2 hours)      Critically ill patients:  140 - 180 mg/dL   Review of Glycemic Control   Results for KHALIQ, TURAY (MRN [id number]) as of 04/02/2015 10:54  Ref. Range 04/02/2015 04:16  Sodium Latest Ref Range: 135-145 mmol/L 135  Potassium Latest Ref Range: 3.5-5.1 mmol/L 3.4 (L)  Chloride Latest Ref Range: 101-111 mmol/L 102  CO2 Latest Ref Range: 22-32 mmol/L 24  BUN Latest Ref Range: 6-20 mg/dL <5 (L)  Creatinine Latest Ref Range: 0.61-1.24 mg/dL 0.44 (L)  Calcium Latest Ref Range: 8.9-10.3 mg/dL 8.5 (L)  EGFR (Non-African Amer.) Latest Ref Range: >60 mL/min >60  EGFR (African American) Latest Ref Range: >60 mL/min >60  Glucose Latest Ref Range: 65-99 mg/dL 192 (H)  Anion gap Latest Ref Range: 5-15  9  Results for CAMREN, LIPSETT (MRN [id number]) as of 04/02/2015 10:54  Ref. Range 04/01/2015 06:15  Hemoglobin A1C Latest Ref Range: 4.8-5.6 % 10.9 (H)   Results for NAOL, ONTIVEROS (MRN [id number]) as of 04/02/2015 10:54  Ref. Range 04/01/2015 06:15  Hemoglobin A1C Latest Ref Range: 4.8-5.6 % 10.9 (H)    Inpatient Diabetes Program Recommendations:     Pt to be discharged on metformin 1000 mg bid and Amaryl 2 mg QAM. Will follow up at [facility] for diabetes management. Ordered Living Well With Diabetes book in Spanish and diabetes videos (Spanish) on pt education channel  Will stop by and see if pt has any questions. Stress importance of f/u with PCP and taking sugar log to appts for medication adjustments.  Thank you. Lorenda Peck, RD, LDN, CDE Inpatient Diabetes Coordinator [phone]

## 2015-04-02 NOTE — Care Management Note (Signed)
Case Management Note  Patient Details  Name: Jacob Schneider MRN: [id number] Date of Birth: Dec 28, 1975  Subjective/Objective:               CM following for progression and d/c planning     Action/Plan: Followup appointment scheduled for this pt.   Expected Discharge Date:     04/02/2015             Expected Discharge Plan:  Home/Self Care  In-House Referral:  Clinical Social Work  Discharge planning Services  CM Consult  Post Acute Care Choice:  NA Choice offered to:  NA  DME Arranged:  N/A DME Agency:  NA  HH Arranged:  NA HH Agency:  NA  Status of Service:  Completed, signed off  Medicare Important Message Given:    Date Medicare IM Given:    Medicare IM give by:    Date Additional Medicare IM Given:    Additional Medicare Important Message give by:     If discussed at Long Length of Stay Meetings, dates discussed:    Additional Comments:  Starlyn Skeans, RN 04/02/2015, 4:03 PM

## 2015-04-02 NOTE — Care Management Note (Signed)
Case Management Note  Patient Details  Name: Jacob Schneider MRN: [id number] Date of Birth: 05/13/1976  Subjective/Objective:           CM following for progression and d/c planning.         Action/Plan: 04/02/2015 Followup appointment scheduled at [facility]  Expected Discharge Date:                  Expected Discharge Plan:  Home/Self Care  In-House Referral:  Clinical Social Work  Discharge planning Services  CM Consult  Post Acute Care Choice:  NA Choice offered to:  NA  DME Arranged:  N/A DME Agency:  NA  HH Arranged:  NA HH Agency:  NA  Status of Service:  Completed, signed off  Medicare Important Message Given:    Date Medicare IM Given:    Medicare IM give by:    Date Additional Medicare IM Given:    Additional Medicare Important Message give by:     If discussed at Long Length of Stay Meetings, dates discussed:    Additional Comments:  Starlyn Skeans, RN 04/02/2015, 4:00 PM

## 2015-04-06 ENCOUNTER — Encounter: Payer: Self-pay | Admitting: Family Medicine

## 2015-04-06 ENCOUNTER — Ambulatory Visit: Payer: MEDICAID | Attending: Family Medicine | Admitting: Family Medicine

## 2015-04-06 VITALS — BP 135/88 | HR 78 | Temp 98.3°F | Resp 18 | Ht 65.0 in | Wt 183.0 lb

## 2015-04-06 DIAGNOSIS — E1310 Other specified diabetes mellitus with ketoacidosis without coma: Secondary | ICD-10-CM

## 2015-04-06 DIAGNOSIS — E1165 Type 2 diabetes mellitus with hyperglycemia: Secondary | ICD-10-CM | POA: Insufficient documentation

## 2015-04-06 DIAGNOSIS — F1010 Alcohol abuse, uncomplicated: Secondary | ICD-10-CM | POA: Insufficient documentation

## 2015-04-06 DIAGNOSIS — M940 Chondrocostal junction syndrome [Tietze]: Secondary | ICD-10-CM | POA: Insufficient documentation

## 2015-04-06 DIAGNOSIS — E1110 Type 2 diabetes mellitus with ketoacidosis without coma: Secondary | ICD-10-CM

## 2015-04-06 DIAGNOSIS — F10931 Alcohol use, unspecified with withdrawal delirium: Secondary | ICD-10-CM

## 2015-04-06 DIAGNOSIS — Z7984 Long term (current) use of oral hypoglycemic drugs: Secondary | ICD-10-CM | POA: Insufficient documentation

## 2015-04-06 DIAGNOSIS — Z79899 Other long term (current) drug therapy: Secondary | ICD-10-CM | POA: Insufficient documentation

## 2015-04-06 DIAGNOSIS — F10231 Alcohol dependence with withdrawal delirium: Secondary | ICD-10-CM

## 2015-04-06 LAB — MICROALBUMIN / CREATININE URINE RATIO
CREATININE, URINE: 50.2 mg/dL
MICROALB/CREAT RATIO: 33.9 mg/g — AB (ref 0.0–30.0)
Microalb, Ur: 1.7 mg/dL (ref <2.0)

## 2015-04-06 LAB — GLUCOSE, POCT (MANUAL RESULT ENTRY): POC GLUCOSE: 281 mg/dL — AB (ref 70–99)

## 2015-04-06 MED ORDER — LISINOPRIL 2.5 MG PO TABS: 2.5000 mg | ORAL_TABLET | Freq: Every day | ORAL | Status: DC

## 2015-04-06 NOTE — Progress Notes (Signed)
CC: Hospital follow up.  HPI: Jacob Schneider is a 39 y.o. male is a history of alcohol abuse who was admitted at [facility] from 03/30/15 - 04/02/15 after he had presented with alcohol withdrawal that was refractory to benzodiazepine, antipsychotics and ketamine and he was admitted to the ICU and placed on Ativan as per CIWA protocol.  During his hospital course he was newly diagnosed with diabetes mellitus with an A1c of 10.9 as his glucose was 444 on presentation. He was commenced on metformin and Amaryl. He also received thiamine, folic acid and multivitamin for alcohol withdrawal. He also had episodes of chest pain which was diagnosed as costochondritis and he was placed on tramadol; he also received Protonix and was subsequently discharged and his condition improved.  Interval history: He reports doing well but does have some residual right-sided chest pains for which he has been using tramadol. He has not been checking his blood sugars because he has no lancets and is unable to recall the name of his glucometer. Patient has No headache, No chest pain, No abdominal pain - No Nausea, No new weakness tingling or numbness, No Cough - SOB.  No Known Allergies Past Medical History  Diagnosis Date  . Diabetes mellitus without complication    Current Outpatient Prescriptions on File Prior to Visit  Medication Sig Dispense Refill  . blood glucose meter kit and supplies KIT Dispense based on patient and insurance preference. Use up to four times daily as directed. (FOR ICD-9 250.00, 250.01). 1 each 0  . folic acid (FOLVITE) 1 MG tablet Take 1 tablet (1 mg total) by mouth daily. 30 tablet 1  . glimepiride (AMARYL) 2 MG tablet Take 1 tablet (2 mg total) by mouth daily with breakfast. 30 tablet 3  . LORazepam (ATIVAN) 1 MG tablet Take 1 tablet (1 mg total) by mouth every 12 (twelve) hours. Take twice a day 2 days for shakiness/anxiety, then as needed 10 tablet 0  . magnesium oxide (MAG-OX)  400 (241.3 MG) MG tablet Take 1 tablet (400 mg total) by mouth 2 (two) times daily. X 1 week 14 tablet 0  . metFORMIN (GLUCOPHAGE) 1000 MG tablet Take 1 tablet (1,000 mg total) by mouth 2 (two) times daily with a meal. 60 tablet 3  . naproxen (NAPROSYN) 250 MG tablet Take 1 tablet (250 mg total) by mouth 2 (two) times daily with a meal. X 1 week 14 tablet 0  . pantoprazole (PROTONIX) 40 MG tablet Take 1 tablet (40 mg total) by mouth daily. Any generic PPI available is okay 30 tablet 1  . thiamine 100 MG tablet Take 1 tablet (100 mg total) by mouth daily. 30 tablet 1  . traMADol (ULTRAM) 50 MG tablet Take 1 tablet (50 mg total) by mouth every 6 (six) hours as needed for moderate pain. 30 tablet 0   No current facility-administered medications on file prior to visit.   No family history on file. Social History   Social History  . Marital Status: Single    Spouse Name: N/A  . Number of Children: N/A  . Years of Education: N/A   Occupational History  . Not on file.   Social History Main Topics  . Smoking status: Never Smoker   . Smokeless tobacco: Not on file  . Alcohol Use: Yes     Comment: "all day beer and liquor"  . Drug Use: Not on file  . Sexual Activity: Not on file   Other Topics Concern  .  Not on file   Social History Narrative  . No narrative on file    Review of Systems: Constitutional: Negative for fever, chills, diaphoresis, activity change, appetite change and fatigue. HENT: Negative for ear pain, nosebleeds, congestion, facial swelling, rhinorrhea, neck pain, neck stiffness and ear discharge.  Eyes: Negative for pain, discharge, redness, itching and visual disturbance. Respiratory: Negative for cough, choking, chest tightness, shortness of breath, wheezing and stridor.  Cardiovascular: Positive for chest pain, negative for palpitations and leg swelling. Gastrointestinal: Negative for abdominal distention. Genitourinary: Negative for dysuria, urgency, frequency,  hematuria, flank pain, decreased urine volume, difficulty urinating and dyspareunia.  Musculoskeletal: Negative for back pain, joint swelling, arthralgias and gait problem. Neurological: Negative for dizziness, tremors, seizures, syncope, facial asymmetry, speech difficulty, weakness, light-headedness, numbness and headaches.  Hematological: Negative for adenopathy. Does not bruise/bleed easily. Psychiatric/Behavioral: Negative for hallucinations, behavioral problems, confusion, dysphoric mood, decreased concentration and agitation.    Objective:    Physical Exam: Constitutional: Patient appears well-developed and well-nourished. No distress. HENT: Normocephalic, atraumatic, External right and left ear normal. Oropharynx is clear and moist.  Eyes: Conjunctivae and EOM are normal. PERRLA, no scleral icterus. Neck: Normal ROM. Neck supple. No JVD. No tracheal deviation. No thyromegaly. CVS: RRR, S1/S2 +, no murmurs, no gallops, no carotid bruit.  Pulmonary: Effort and breath sounds normal, no stridor, rhonchi, wheezes, rales, reproducible right-sided chest tenderness. Abdominal: Soft. BS +,  no distension, tenderness, rebound or guarding.  Musculoskeletal: Normal range of motion. No edema and no tenderness.  Lymphadenopathy: No lymphadenopathy noted, cervical, inguinal or axillary Neuro: Alert. Normal reflexes, muscle tone coordination. No cranial nerve deficit. Skin: Skin is warm and dry. No rash noted. Not diaphoretic. No erythema. No pallor. Psychiatric: Normal mood and affect. Behavior, judgment, thought content normal.  Lab Results  Component Value Date   WBC 6.5 04/01/2015   HGB 12.9* 04/01/2015   HCT 39.3 04/01/2015   MCV 81.0 04/01/2015   PLT 166 04/01/2015   Lab Results  Component Value Date   CREATININE 0.44* 04/02/2015   BUN <5* 04/02/2015   NA 135 04/02/2015   K 3.4* 04/02/2015   CL 102 04/02/2015   CO2 24 04/02/2015    Lab Results  Component Value Date   HGBA1C  10.9* 04/01/2015       Assessment and plan:  39 year old male with a history of alcohol abuse and recently diagnosed with type 2 diabetes mellitus, A1c of 10.9 for this time reports no alcohol consumption for the last 15 days  Newly diagnosed type 2 diabetes mellitus: Uncontrolled with A1c of 10.9 Continue metformin and Amaryl. Microalbumin sent off today. Placed on low-dose lisinopril. Keep blood sugar logs with fasting goals of 80-120 mg/dl, random of less than 180 and in the event of sugars less than 60 mg/dl or greater than 400 mg/dl please notify the clinic ASAP. It is recommended that you undergo annual eye exams and annual foot exams. Pneumovax is recommended every 5 years before the age of 68 and once for a lifetime at or after the age of 28. I will see him back in 2 weeks and review the blood sugar log at that time, Pneumovax, foot exam at next visit as well. Scheduled to see the clinical pharmacist for diabetic education and he is also to bring in his glucometer at that time.  Alcohol abuse: Patient counseled strongly about cessation. Patient is in a hurry today and so was unable to see the LCSW regarding resources for Alcoholics Anonymous but  will be willing to do so at the next visit.  Costochondritis: Reproducible chest tenderness on palpation He does have NSAIDs and tramadol for this pain  This note has been created with Surveyor, quantity. Any transcriptional errors are unintentional.      Arnoldo Morale, MD. [facility] and Wellness [phone] 04/06/2015, 9:13 AM

## 2015-04-06 NOTE — Patient Instructions (Signed)
La diabetes mellitus y los alimentos (Diabetes Mellitus and Food) Es importante que controle su nivel de azcar en la sangre (glucosa). El nivel de glucosa en sangre depende en gran medida de lo que usted come. Comer alimentos saludables en las cantidades adecuadas a lo largo del da, aproximadamente a la misma hora todos los das, lo ayudar a controlar su nivel de glucosa en sangre. Tambin puede ayudarlo a retrasar o evitar el empeoramiento de la diabetes mellitus. Comer de manera saludable incluso puede ayudarlo a mejorar el nivel de presin arterial y a alcanzar o mantener un peso saludable.  CMO PUEDEN AFECTARME LOS ALIMENTOS? Carbohidratos Los carbohidratos afectan el nivel de glucosa en sangre ms que cualquier otro tipo de alimento. El nutricionista lo ayudar a determinar cuntos carbohidratos puede consumir en cada comida y ensearle a contarlos. El recuento de carbohidratos es importante para mantener la glucosa en sangre en un nivel saludable, en especial si utiliza insulina o toma determinados medicamentos para la diabetes mellitus. Alcohol El alcohol puede provocar disminuciones sbitas de la glucosa en sangre (hipoglucemia), en especial si utiliza insulina o toma determinados medicamentos para la diabetes mellitus. La hipoglucemia es una afeccin que puede poner en peligro la vida. Los sntomas de la hipoglucemia (somnolencia, mareos y desorientacin) son similares a los sntomas de haber consumido mucho alcohol.  Si el mdico lo autoriza a beber alcohol, hgalo con moderacin y siga estas pautas:  Las mujeres no deben beber ms de un trago por da, y los hombres no deben beber ms de dos tragos por da. Un trago es igual a:  12 onzas (355 ml) de cerveza  5 onzas de vino (150 ml) de vino  1,5onzas (45ml) de bebidas espirituosas  No beba con el estmago vaco.  Mantngase hidratado. Beba agua, gaseosas dietticas o t helado sin azcar.  Las gaseosas comunes, los jugos y  otros refrescos podran contener muchos carbohidratos y se deben contar. QU ALIMENTOS NO SE RECOMIENDAN? Cuando haga las elecciones de alimentos, es importante que recuerde que todos los alimentos son distintos. Algunos tienen menos nutrientes que otros por porcin, aunque podran tener la misma cantidad de caloras o carbohidratos. Es difcil darle al cuerpo lo que necesita cuando consume alimentos con menos nutrientes. Estos son algunos ejemplos de alimentos que debera evitar ya que contienen muchas caloras y carbohidratos, pero pocos nutrientes:  Grasas trans (la mayora de los alimentos procesados incluyen grasas trans en la etiqueta de Informacin nutricional).  Gaseosas comunes.  Jugos.  Caramelos.  Dulces, como tortas, pasteles, rosquillas y galletas.  Comidas fritas. QU ALIMENTOS PUEDO COMER? Consuma alimentos ricos en nutrientes, que nutrirn el cuerpo y lo mantendrn saludable. Los alimentos que debe comer tambin dependern de varios factores, como:  Las caloras que necesita.  Los medicamentos que toma.  Su peso.  El nivel de glucosa en sangre.  El nivel de presin arterial.  El nivel de colesterol. Tambin debe consumir una variedad de alimentos, como:  Protenas, como carne, aves, pescado, tofu, frutos secos y semillas (las protenas de animales magros son mejores).  Frutas.  Verduras.  Productos lcteos, como leche, queso y yogur (descremados son mejores).  Panes, granos, pastas, cereales, arroz y frijoles.  Grasas, como aceite de oliva, margarina sin grasas trans, aceite de canola, aguacate y aceitunas. TODOS LOS QUE PADECEN DIABETES MELLITUS TIENEN EL MISMO PLAN DE COMIDAS? Dado que todas las personas que padecen diabetes mellitus son distintas, no hay un solo plan de comidas que funcione para todos. Es muy   importante que se rena con un nutricionista que lo ayudar a crear un plan de comidas adecuado para usted. Document Released: 09/27/2007  Document Revised: 06/25/2013 Exit[employer] Patient Information 2015 [employer]. This information is not intended to replace advice given to you by your health care provider. Make sure you discuss any questions you have with your health care provider.  

## 2015-04-06 NOTE — Progress Notes (Signed)
Patient here for being hospitalized for 2 days. Patient could not sleep, felt like someone was hitting him. Patient tried to stop drinking alcohol that led to the sleeping and feeling like he was being hit. Patient reports pain in right side of chest, at level 6, described as throbbing. Patient said this pain came on suddenly when he went to the hospital.   Patient has glucometer, has not been checking sugar, needs lancets.  Current blood glucose 281, patient reports eating a slice of pizza and drinking coffee this morning.

## 2015-04-08 ENCOUNTER — Ambulatory Visit: Payer: Self-pay

## 2015-05-01 ENCOUNTER — Telehealth: Payer: Self-pay

## 2015-05-01 NOTE — Telephone Encounter (Signed)
CMA called [employer] interpreter and spoke with Alesa [phone]. Pt's home number on file was stating it was invalid, so the interpreter left a message on the pt's cell to return my call.

## 2015-05-01 NOTE — Telephone Encounter (Signed)
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

## 2015-05-01 NOTE — Telephone Encounter (Signed)
CMA called [employer] [phone]. Pt called back and interpreter was able reach pt given him his lab results. Pt verbalized that he understood his results with no further questions.

## 2015-05-01 NOTE — Telephone Encounter (Signed)
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 

## 2017-05-17 ENCOUNTER — Encounter (HOSPITAL_COMMUNITY): Payer: Self-pay | Admitting: Emergency Medicine

## 2017-05-17 ENCOUNTER — Other Ambulatory Visit: Payer: Self-pay

## 2017-05-17 ENCOUNTER — Emergency Department (HOSPITAL_COMMUNITY)
Admission: EM | Admit: 2017-05-17 | Discharge: 2017-05-17 | Disposition: A | Discharge status: Home / Self Care | Payer: Self-pay | Attending: Emergency Medicine | Admitting: Emergency Medicine

## 2017-05-17 DIAGNOSIS — E1140 Type 2 diabetes mellitus with diabetic neuropathy, unspecified: Secondary | ICD-10-CM | POA: Insufficient documentation

## 2017-05-17 DIAGNOSIS — E1165 Type 2 diabetes mellitus with hyperglycemia: Secondary | ICD-10-CM | POA: Insufficient documentation

## 2017-05-17 DIAGNOSIS — R739 Hyperglycemia, unspecified: Secondary | ICD-10-CM

## 2017-05-17 DIAGNOSIS — G629 Polyneuropathy, unspecified: Secondary | ICD-10-CM

## 2017-05-17 DIAGNOSIS — Z7984 Long term (current) use of oral hypoglycemic drugs: Secondary | ICD-10-CM | POA: Insufficient documentation

## 2017-05-17 DIAGNOSIS — Z79899 Other long term (current) drug therapy: Secondary | ICD-10-CM | POA: Insufficient documentation

## 2017-05-17 LAB — BASIC METABOLIC PANEL
ANION GAP: 8 (ref 5–15)
BUN: 6 mg/dL (ref 6–20)
CO2: 25 mmol/L (ref 22–32)
Calcium: 9.1 mg/dL (ref 8.9–10.3)
Chloride: 99 mmol/L — ABNORMAL LOW (ref 101–111)
Creatinine, Ser: 0.58 mg/dL — ABNORMAL LOW (ref 0.61–1.24)
GFR calc Af Amer: >60 mL/min (ref >60)
Glucose, Bld: 282 mg/dL — ABNORMAL HIGH (ref 65–99)
POTASSIUM: 4.7 mmol/L (ref 3.5–5.1)
SODIUM: 132 mmol/L — AB (ref 135–145)

## 2017-05-17 LAB — CBC
HEMATOCRIT: 43.2 % (ref 39.0–52.0)
Hemoglobin: 14.6 g/dL (ref 13.0–17.0)
MCH: 27.8 pg (ref 26.0–34.0)
MCHC: 33.8 g/dL (ref 30.0–36.0)
MCV: 82.3 fL (ref 78.0–100.0)
Platelets: 169 10*3/uL (ref 150–400)
RBC: 5.25 MIL/uL (ref 4.22–5.81)
RDW: 13.2 % (ref 11.5–15.5)
WBC: 6.9 10*3/uL (ref 4.0–10.5)

## 2017-05-17 LAB — CBG MONITORING, ED: GLUCOSE-CAPILLARY: 250 mg/dL — AB (ref 65–99)

## 2017-05-17 MED ORDER — METFORMIN HCL 500 MG PO TABS: 500.0000 mg | ORAL_TABLET | Freq: Two times a day (BID) | ORAL | 0 refills | Status: DC

## 2017-05-17 MED ORDER — HYDROCHLOROTHIAZIDE 25 MG PO TABS: 25.0000 mg | ORAL_TABLET | Freq: Every day | ORAL | 0 refills | Status: DC

## 2017-05-17 NOTE — Discharge Instructions (Signed)
Call the number on these discharge instructions to get a primary care physician.  Your blood pressure should be rechecked within the next week.  Today's was elevated at 168/114.  Avoid alcohol, beer and wine as elevated blood sugar and alcohol contribute to nerve pain

## 2017-05-17 NOTE — ED Triage Notes (Signed)
Pt states he is a diabetic, and has not been taking any medicine. Now his feet and hands feel numb and tingling, states it is from the diabetes -- wife states pt has been drinking etoh, 2-3 daily (40 ounces beer) Pt speaks minimal [place]

## 2017-05-17 NOTE — ED Notes (Signed)
Pt states through interpretor that question have been answered and he understands medications and follow up instructions. Home stable with steady gait.

## 2017-05-17 NOTE — ED Provider Notes (Signed)
[facility] EMERGENCY DEPARTMENT Provider Note   CSN: [id number] Arrival date & time: 05/17/17  1102     History   Chief Complaint Chief Complaint  Patient presents with  . Diabetes  . Peripheral Neuropathy    HPI Jacob Schneider is a 41 y.o. male.  HPI History is obtained from professional medical interpreter.  Patient speaks no [place].  He complains of bilateral foot pain and bilateral hand pain for 4 months becoming worse over the past week, sometimes interfering with his sleep.  He denies fever denies trauma denies injury.  Also complains of a tingling sensation in his hands and feet.  Nothing makes symptoms better or worse.  No other associated symptoms. Past Medical History:  Diagnosis Date  . Diabetes mellitus without complication [facility])     Patient Active Problem List   Diagnosis Date Noted  . Costochondritis 04/06/2015  . DM (diabetes mellitus) type 2, uncontrolled 04/01/2015  . Hypokalemia 04/01/2015  . Hypomagnesemia 04/01/2015  . Alcohol withdrawal delirium, acute, hyperactive ([place]) 03/30/2015    Past Surgical History:  Procedure Laterality Date  . BACK SURGERY  2004       Home Medications    Prior to Admission medications   Medication Sig Start Date End Date Taking? Authorizing Provider  blood glucose meter kit and supplies KIT Dispense based on patient and insurance preference. Use up to four times daily as directed. (FOR ICD-9 250.00, 250.01). Patient not taking: Reported on 05/17/2017 04/02/15   Mendel Corning, MD  folic acid (FOLVITE) 1 MG tablet Take 1 tablet (1 mg total) by mouth daily. Patient not taking: Reported on 05/17/2017 04/02/15   Rai, Vernelle Emerald, MD  glimepiride (AMARYL) 2 MG tablet Take 1 tablet (2 mg total) by mouth daily with breakfast. Patient not taking: Reported on 05/17/2017 04/02/15   Rai, Vernelle Emerald, MD  hydrochlorothiazide (HYDRODIURIL) 25 MG tablet Take 1 tablet (25 mg total) daily by mouth. 05/17/17    Orlie Dakin, MD  lisinopril (PRINIVIL,ZESTRIL) 2.5 MG tablet Take 1 tablet (2.5 mg total) by mouth daily. Patient not taking: Reported on 05/17/2017 04/06/15   Arnoldo Morale, MD  LORazepam (ATIVAN) 1 MG tablet Take 1 tablet (1 mg total) by mouth every 12 (twelve) hours. Take twice a day 2 days for shakiness/anxiety, then as needed Patient not taking: Reported on 05/17/2017 04/02/15   Rai, Vernelle Emerald, MD  magnesium oxide (MAG-OX) 400 (241.3 MG) MG tablet Take 1 tablet (400 mg total) by mouth 2 (two) times daily. X 1 week Patient not taking: Reported on 04/06/2015 04/02/15   Mendel Corning, MD  metFORMIN (GLUCOPHAGE) 500 MG tablet Take 1 tablet (500 mg total) 2 (two) times daily with a meal by mouth. 05/17/17   Orlie Dakin, MD  naproxen (NAPROSYN) 250 MG tablet Take 1 tablet (250 mg total) by mouth 2 (two) times daily with a meal. X 1 week Patient not taking: Reported on 05/17/2017 04/02/15   Rai, Vernelle Emerald, MD  pantoprazole (PROTONIX) 40 MG tablet Take 1 tablet (40 mg total) by mouth daily. Any generic PPI available is okay Patient not taking: Reported on 05/17/2017 04/02/15   Rai, Vernelle Emerald, MD  thiamine 100 MG tablet Take 1 tablet (100 mg total) by mouth daily. Patient not taking: Reported on 05/17/2017 04/02/15   Rai, Vernelle Emerald, MD  traMADol (ULTRAM) 50 MG tablet Take 1 tablet (50 mg total) by mouth every 6 (six) hours as needed for moderate pain. Patient not taking:  Reported on 05/17/2017 04/02/15   Mendel Corning, MD   Patient has taken no medicine for several months Family History Family History  Problem Relation Age of Onset  . Diabetes Father     Social History Social History   Tobacco Use  . Smoking status: Light Tobacco Smoker  . Smokeless tobacco: Never Used  Substance Use Topics  . Alcohol use: Yes  . Drug use: No  Drinks 1 sixpack every weekend of beer no other alcohol use.  No drug use   Allergies   Patient has no known allergies.   Review of Systems Review  of Systems  Constitutional: Negative.   HENT: Negative.   Respiratory: Negative.   Cardiovascular: Negative.   Gastrointestinal: Negative.   Musculoskeletal: Positive for arthralgias.  Skin: Negative.   Allergic/Immunologic: Positive for immunocompromised state.       Diabetic  Neurological: Negative.   Psychiatric/Behavioral: Negative.   All other systems reviewed and are negative.    Physical Exam Updated Vital Signs BP (!) 168/114   Pulse 89   Temp 99.6 F (37.6 C) (Oral)   Resp (!) 21   Ht 5' 1"  (1.549 m)   SpO2 100%   BMI 34.58 kg/m   Physical Exam  Constitutional: He appears well-developed and well-nourished.  HENT:  Head: Normocephalic and atraumatic.  Eyes: Conjunctivae are normal. Pupils are equal, round, and reactive to light.  Neck: Neck supple. No tracheal deviation present. No thyromegaly present.  Cardiovascular: Normal rate and regular rhythm.  No murmur heard. Pulmonary/Chest: Effort normal and breath sounds normal.  Abdominal: Soft. Bowel sounds are normal. He exhibits no distension. There is no tenderness.  Musculoskeletal: Normal range of motion. He exhibits no edema or tenderness.  Neurological: He is alert. Coordination normal.  Skin: Skin is warm and dry. No rash noted.  Psychiatric: He has a normal mood and affect.  Nursing note and vitals reviewed.    ED Treatments / Results  Labs (all labs ordered are listed, but only abnormal results are displayed) Labs Reviewed  BASIC METABOLIC PANEL - Abnormal; Notable for the following components:      Result Value   Sodium 132 (*)    Chloride 99 (*)    Glucose, Bld 282 (*)    Creatinine, Ser 0.58 (*)    All other components within normal limits  CBG MONITORING, ED - Abnormal; Notable for the following components:   Glucose-Capillary 250 (*)    All other components within normal limits  CBC  URINALYSIS, ROUTINE W REFLEX MICROSCOPIC   Results for orders placed or performed during the hospital  encounter of 19/41/74  Basic metabolic panel  Result Value Ref Range   Sodium 132 (L) 135 - 145 mmol/L   Potassium 4.7 3.5 - 5.1 mmol/L   Chloride 99 (L) 101 - 111 mmol/L   CO2 25 22 - 32 mmol/L   Glucose, Bld 282 (H) 65 - 99 mg/dL   BUN 6 6 - 20 mg/dL   Creatinine, Ser 0.58 (L) 0.61 - 1.24 mg/dL   Calcium 9.1 8.9 - 10.3 mg/dL   GFR calc non Af Amer >60 >60 mL/min   GFR calc Af Amer >60 >60 mL/min   Anion gap 8 5 - 15  CBC  Result Value Ref Range   WBC 6.9 4.0 - 10.5 K/uL   RBC 5.25 4.22 - 5.81 MIL/uL   Hemoglobin 14.6 13.0 - 17.0 g/dL   HCT 43.2 39.0 - 52.0 %   MCV 82.3  78.0 - 100.0 fL   MCH 27.8 26.0 - 34.0 pg   MCHC 33.8 30.0 - 36.0 g/dL   RDW 13.2 11.5 - 15.5 %   Platelets 169 150 - 400 K/uL  CBG monitoring, ED  Result Value Ref Range   Glucose-Capillary 250 (H) 65 - 99 mg/dL   No results found. EKG  EKG Interpretation None       Radiology No results found.  Procedures Procedures (including critical care time)  Medications Ordered in ED Medications - No data to display   Initial Impression / Assessment and Plan / ED Course  I have reviewed the triage vital signs and the nursing notes.  Pertinent labs & imaging results that were available during my care of the patient were reviewed by me and considered in my medical decision making (see chart for details).     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  Plan prescriptions metformin, HCTZ.  Blood pressure recheck 1 week.  Referral primary care  Final Clinical Impressions(s) / ED Diagnoses   Final diagnoses:  Neuropathy  Hyperglycemia    ED Discharge Orders        Ordered    hydrochlorothiazide (HYDRODIURIL) 25 MG tablet  Daily     05/17/17 1624    metFORMIN (GLUCOPHAGE) 500 MG tablet  2 times daily with meals     05/17/17 1624       Orlie Dakin, MD 05/17/17 [id number]

## 2017-05-17 NOTE — ED Notes (Signed)
CBG at 15:03 was 250.

## 2017-05-17 NOTE — ED Notes (Signed)
Through interpretor. Pt points to feet when asked about pain.

## 2017-05-17 NOTE — ED Notes (Signed)
Dr. Shela CommonsJ states he does not want UA.

## 2017-07-01 ENCOUNTER — Emergency Department (HOSPITAL_COMMUNITY): Payer: Self-pay

## 2017-07-01 ENCOUNTER — Inpatient Hospital Stay (HOSPITAL_COMMUNITY)
Admission: EM | Admit: 2017-07-01 | Discharge: 2017-07-06 | DRG: 896 | Disposition: A | Discharge status: Home / Self Care | Payer: Self-pay | Attending: Internal Medicine | Admitting: Internal Medicine

## 2017-07-01 DIAGNOSIS — E872 Acidosis, unspecified: Secondary | ICD-10-CM

## 2017-07-01 DIAGNOSIS — F1490 Cocaine use, unspecified, uncomplicated: Secondary | ICD-10-CM | POA: Diagnosis present

## 2017-07-01 DIAGNOSIS — K2100 Gastro-esophageal reflux disease with esophagitis, without bleeding: Secondary | ICD-10-CM

## 2017-07-01 DIAGNOSIS — N179 Acute kidney failure, unspecified: Secondary | ICD-10-CM | POA: Diagnosis present

## 2017-07-01 DIAGNOSIS — E871 Hypo-osmolality and hyponatremia: Secondary | ICD-10-CM | POA: Diagnosis present

## 2017-07-01 DIAGNOSIS — Z23 Encounter for immunization: Secondary | ICD-10-CM

## 2017-07-01 DIAGNOSIS — E119 Type 2 diabetes mellitus without complications: Secondary | ICD-10-CM | POA: Diagnosis present

## 2017-07-01 DIAGNOSIS — E860 Dehydration: Secondary | ICD-10-CM | POA: Diagnosis present

## 2017-07-01 DIAGNOSIS — F10231 Alcohol dependence with withdrawal delirium: Principal | ICD-10-CM | POA: Diagnosis present

## 2017-07-01 DIAGNOSIS — R112 Nausea with vomiting, unspecified: Secondary | ICD-10-CM

## 2017-07-01 DIAGNOSIS — Z91128 Patient's intentional underdosing of medication regimen for other reason: Secondary | ICD-10-CM

## 2017-07-01 DIAGNOSIS — E876 Hypokalemia: Secondary | ICD-10-CM | POA: Diagnosis present

## 2017-07-01 DIAGNOSIS — F10931 Alcohol use, unspecified with withdrawal delirium: Secondary | ICD-10-CM

## 2017-07-01 DIAGNOSIS — Z720 Tobacco use: Secondary | ICD-10-CM

## 2017-07-01 DIAGNOSIS — R1013 Epigastric pain: Secondary | ICD-10-CM

## 2017-07-01 DIAGNOSIS — E1165 Type 2 diabetes mellitus with hyperglycemia: Secondary | ICD-10-CM

## 2017-07-01 DIAGNOSIS — R339 Retention of urine, unspecified: Secondary | ICD-10-CM | POA: Diagnosis present

## 2017-07-01 DIAGNOSIS — E1110 Type 2 diabetes mellitus with ketoacidosis without coma: Secondary | ICD-10-CM | POA: Diagnosis present

## 2017-07-01 DIAGNOSIS — E1143 Type 2 diabetes mellitus with diabetic autonomic (poly)neuropathy: Secondary | ICD-10-CM

## 2017-07-01 DIAGNOSIS — R000 Tachycardia, unspecified: Secondary | ICD-10-CM

## 2017-07-01 DIAGNOSIS — K3184 Gastroparesis: Secondary | ICD-10-CM | POA: Diagnosis present

## 2017-07-01 DIAGNOSIS — K2920 Alcoholic gastritis without bleeding: Secondary | ICD-10-CM | POA: Insufficient documentation

## 2017-07-01 DIAGNOSIS — I10 Essential (primary) hypertension: Secondary | ICD-10-CM | POA: Diagnosis present

## 2017-07-01 DIAGNOSIS — Z833 Family history of diabetes mellitus: Secondary | ICD-10-CM

## 2017-07-01 DIAGNOSIS — T383X6A Underdosing of insulin and oral hypoglycemic [antidiabetic] drugs, initial encounter: Secondary | ICD-10-CM | POA: Diagnosis present

## 2017-07-01 DIAGNOSIS — E1310 Other specified diabetes mellitus with ketoacidosis without coma: Secondary | ICD-10-CM

## 2017-07-01 DIAGNOSIS — R252 Cramp and spasm: Secondary | ICD-10-CM | POA: Diagnosis present

## 2017-07-01 DIAGNOSIS — R079 Chest pain, unspecified: Secondary | ICD-10-CM | POA: Diagnosis present

## 2017-07-01 LAB — URINALYSIS, ROUTINE W REFLEX MICROSCOPIC
Bacteria, UA: NONE SEEN
Bilirubin Urine: NEGATIVE
Glucose, UA: >=500 mg/dL — AB
Hgb urine dipstick: NEGATIVE
KETONES UR: 80 mg/dL — AB
Leukocytes, UA: NEGATIVE
Nitrite: NEGATIVE
PH: 5 (ref 5.0–8.0)
Protein, ur: NEGATIVE mg/dL
RBC / HPF: NONE SEEN RBC/hpf (ref 0–5)
SPECIFIC GRAVITY, URINE: 1.028 (ref 1.005–1.030)
SQUAMOUS EPITHELIAL / LPF: NONE SEEN

## 2017-07-01 LAB — BLOOD GAS, ARTERIAL
ACID-BASE DEFICIT: 0.1 mmol/L (ref 0.0–2.0)
BICARBONATE: 23.2 mmol/L (ref 20.0–28.0)
Drawn by: 236041
FIO2: 21
O2 SAT: 96.2 %
PCO2 ART: 32.2 mmHg (ref 32.0–48.0)
PH ART: 7.471 — AB (ref 7.350–7.450)
Patient temperature: 98.6
pO2, Arterial: 84.5 mmHg (ref 83.0–108.0)

## 2017-07-01 LAB — LACTIC ACID, PLASMA: Lactic Acid, Venous: 1.4 mmol/L (ref 0.5–1.9)

## 2017-07-01 LAB — CBC WITH DIFFERENTIAL/PLATELET
BASOS ABS: 0 10*3/uL (ref 0.0–0.1)
Basophils Relative: 0 %
Eosinophils Absolute: 0 10*3/uL (ref 0.0–0.7)
Eosinophils Relative: 0 %
HEMATOCRIT: 43.6 % (ref 39.0–52.0)
Hemoglobin: 14.8 g/dL (ref 13.0–17.0)
LYMPHS PCT: 6 %
Lymphs Abs: 0.9 10*3/uL (ref 0.7–4.0)
MCH: 26.2 pg (ref 26.0–34.0)
MCHC: 33.9 g/dL (ref 30.0–36.0)
MCV: 77.2 fL — AB (ref 78.0–100.0)
Monocytes Absolute: 0.2 10*3/uL (ref 0.1–1.0)
Monocytes Relative: 2 %
NEUTROS ABS: 14.6 10*3/uL — AB (ref 1.7–7.7)
Neutrophils Relative %: 92 %
Platelets: 253 10*3/uL (ref 150–400)
RBC: 5.65 MIL/uL (ref 4.22–5.81)
RDW: 12.5 % (ref 11.5–15.5)
WBC: 15.8 10*3/uL — AB (ref 4.0–10.5)

## 2017-07-01 LAB — I-STAT TROPONIN, ED: TROPONIN I, POC: 0 ng/mL (ref 0.00–0.08)

## 2017-07-01 LAB — CBC
HCT: 39.3 % (ref 39.0–52.0)
HEMOGLOBIN: 13.1 g/dL (ref 13.0–17.0)
MCH: 25.5 pg — AB (ref 26.0–34.0)
MCHC: 33.3 g/dL (ref 30.0–36.0)
MCV: 76.6 fL — AB (ref 78.0–100.0)
Platelets: 218 10*3/uL (ref 150–400)
RBC: 5.13 MIL/uL (ref 4.22–5.81)
RDW: 12.6 % (ref 11.5–15.5)
WBC: 13.2 10*3/uL — ABNORMAL HIGH (ref 4.0–10.5)

## 2017-07-01 LAB — COMPREHENSIVE METABOLIC PANEL
ALK PHOS: 95 U/L (ref 38–126)
ALT: 17 U/L (ref 17–63)
AST: 32 U/L (ref 15–41)
Albumin: 4.2 g/dL (ref 3.5–5.0)
Anion gap: 21 — ABNORMAL HIGH (ref 5–15)
BILIRUBIN TOTAL: 1 mg/dL (ref 0.3–1.2)
BUN: 5 mg/dL — AB (ref 6–20)
CO2: 20 mmol/L — ABNORMAL LOW (ref 22–32)
CREATININE: 1.78 mg/dL — AB (ref 0.61–1.24)
Calcium: 8.8 mg/dL — ABNORMAL LOW (ref 8.9–10.3)
Chloride: 91 mmol/L — ABNORMAL LOW (ref 101–111)
GFR calc Af Amer: 53 mL/min — ABNORMAL LOW (ref >60)
GFR, EST NON AFRICAN AMERICAN: 46 mL/min — AB (ref >60)
Glucose, Bld: 414 mg/dL — ABNORMAL HIGH (ref 65–99)
Potassium: 3.7 mmol/L (ref 3.5–5.1)
Sodium: 132 mmol/L — ABNORMAL LOW (ref 135–145)
TOTAL PROTEIN: 7.5 g/dL (ref 6.5–8.1)

## 2017-07-01 LAB — I-STAT VENOUS BLOOD GAS, ED
ACID-BASE DEFICIT: 3 mmol/L — AB (ref 0.0–2.0)
Bicarbonate: 20.7 mmol/L (ref 20.0–28.0)
O2 Saturation: 61 %
PH VEN: 7.418 (ref 7.250–7.430)
PO2 VEN: 31 mmHg — AB (ref 32.0–45.0)
TCO2: 22 mmol/L (ref 22–32)
pCO2, Ven: 32.1 mmHg — ABNORMAL LOW (ref 44.0–60.0)

## 2017-07-01 LAB — GLUCOSE, CAPILLARY
GLUCOSE-CAPILLARY: 299 mg/dL — AB (ref 65–99)
Glucose-Capillary: 267 mg/dL — ABNORMAL HIGH (ref 65–99)

## 2017-07-01 LAB — I-STAT CG4 LACTIC ACID, ED
LACTIC ACID, VENOUS: 1.89 mmol/L (ref 0.5–1.9)
Lactic Acid, Venous: 5.7 mmol/L (ref 0.5–1.9)

## 2017-07-01 LAB — RAPID URINE DRUG SCREEN, HOSP PERFORMED
Amphetamines: NOT DETECTED
Barbiturates: NOT DETECTED
Benzodiazepines: NOT DETECTED
COCAINE: NOT DETECTED
OPIATES: POSITIVE — AB
TETRAHYDROCANNABINOL: NOT DETECTED

## 2017-07-01 LAB — BASIC METABOLIC PANEL
Anion gap: 17 — ABNORMAL HIGH (ref 5–15)
CHLORIDE: 99 mmol/L — AB (ref 101–111)
CO2: 18 mmol/L — AB (ref 22–32)
CREATININE: 1.15 mg/dL (ref 0.61–1.24)
Calcium: 7.7 mg/dL — ABNORMAL LOW (ref 8.9–10.3)
GFR calc Af Amer: >60 mL/min (ref >60)
GFR calc non Af Amer: >60 mL/min (ref >60)
GLUCOSE: 294 mg/dL — AB (ref 65–99)
Potassium: 3.4 mmol/L — ABNORMAL LOW (ref 3.5–5.1)
SODIUM: 134 mmol/L — AB (ref 135–145)

## 2017-07-01 LAB — ETHANOL: Alcohol, Ethyl (B): <10 mg/dL (ref <10)

## 2017-07-01 LAB — LIPASE, BLOOD: LIPASE: 22 U/L (ref 11–51)

## 2017-07-01 LAB — CBG MONITORING, ED
GLUCOSE-CAPILLARY: 297 mg/dL — AB (ref 65–99)
Glucose-Capillary: 415 mg/dL — ABNORMAL HIGH (ref 65–99)

## 2017-07-01 LAB — TROPONIN I: Troponin I: <0.03 ng/mL (ref <0.03)

## 2017-07-01 MED ORDER — ENOXAPARIN SODIUM 40 MG/0.4ML ~~LOC~~ SOLN
40.0000 mg | SUBCUTANEOUS | Status: DC
  Administered 2017-07-02 – 2017-07-06 (×5): 40 mg via SUBCUTANEOUS
  Filled 2017-07-01 (×5): qty 0.4

## 2017-07-01 MED ORDER — LORAZEPAM 1 MG PO TABS
0.0000 mg | ORAL_TABLET | Freq: Two times a day (BID) | ORAL | Status: AC
  Administered 2017-07-04 – 2017-07-05 (×2): 1 mg via ORAL
  Filled 2017-07-01 (×2): qty 1

## 2017-07-01 MED ORDER — LORAZEPAM 2 MG/ML IJ SOLN
1.0000 mg | Freq: Once | INTRAMUSCULAR | Status: AC
  Administered 2017-07-01: 1 mg via INTRAVENOUS
  Filled 2017-07-01: qty 1

## 2017-07-01 MED ORDER — SODIUM CHLORIDE 0.9 % IV SOLN: INTRAVENOUS | Status: DC

## 2017-07-01 MED ORDER — ONDANSETRON HCL 4 MG PO TABS
4.0000 mg | ORAL_TABLET | Freq: Four times a day (QID) | ORAL | Status: DC | PRN
  Administered 2017-07-03 – 2017-07-04 (×2): 4 mg via ORAL
  Filled 2017-07-01 (×2): qty 1

## 2017-07-01 MED ORDER — VITAMIN B-1 100 MG PO TABS
100.0000 mg | ORAL_TABLET | Freq: Every day | ORAL | Status: DC
  Administered 2017-07-02 – 2017-07-06 (×5): 100 mg via ORAL
  Filled 2017-07-01 (×5): qty 1

## 2017-07-01 MED ORDER — THIAMINE HCL 100 MG/ML IJ SOLN
100.0000 mg | Freq: Every day | INTRAMUSCULAR | Status: DC
  Administered 2017-07-01: 100 mg via INTRAVENOUS
  Filled 2017-07-01 (×4): qty 2

## 2017-07-01 MED ORDER — FAMOTIDINE IN NACL 20-0.9 MG/50ML-% IV SOLN
20.0000 mg | Freq: Once | INTRAVENOUS | Status: AC
  Administered 2017-07-01: 20 mg via INTRAVENOUS
  Filled 2017-07-01: qty 50

## 2017-07-01 MED ORDER — ONDANSETRON HCL 4 MG/2ML IJ SOLN
4.0000 mg | Freq: Four times a day (QID) | INTRAMUSCULAR | Status: DC | PRN
  Administered 2017-07-02 – 2017-07-03 (×3): 4 mg via INTRAVENOUS
  Filled 2017-07-01 (×3): qty 2

## 2017-07-01 MED ORDER — SODIUM CHLORIDE 0.9 % IV SOLN
INTRAVENOUS | Status: DC
  Administered 2017-07-01: 2.4 [IU]/h via INTRAVENOUS
  Filled 2017-07-01: qty 1

## 2017-07-01 MED ORDER — LORAZEPAM 2 MG/ML IJ SOLN
0.0000 mg | Freq: Two times a day (BID) | INTRAMUSCULAR | Status: AC
  Administered 2017-07-04: 2 mg via INTRAVENOUS
  Filled 2017-07-01 (×2): qty 1

## 2017-07-01 MED ORDER — DEXTROSE 50 % IV SOLN: 25.0000 mL | INTRAVENOUS | Status: DC | PRN

## 2017-07-01 MED ORDER — FOLIC ACID 1 MG PO TABS
1.0000 mg | ORAL_TABLET | Freq: Every day | ORAL | Status: DC
  Administered 2017-07-01 – 2017-07-06 (×6): 1 mg via ORAL
  Filled 2017-07-01 (×6): qty 1

## 2017-07-01 MED ORDER — SODIUM CHLORIDE 0.9 % IV SOLN
Freq: Once | INTRAVENOUS | Status: AC
  Administered 2017-07-01: 19:00:00 via INTRAVENOUS

## 2017-07-01 MED ORDER — SODIUM CHLORIDE 0.9 % IV BOLUS (SEPSIS)
1000.0000 mL | Freq: Once | INTRAVENOUS | Status: AC
  Administered 2017-07-01: 1000 mL via INTRAVENOUS

## 2017-07-01 MED ORDER — GI COCKTAIL ~~LOC~~
30.0000 mL | Freq: Once | ORAL | Status: AC
  Administered 2017-07-01: 30 mL via ORAL
  Filled 2017-07-01: qty 30

## 2017-07-01 MED ORDER — GI COCKTAIL ~~LOC~~
30.0000 mL | Freq: Three times a day (TID) | ORAL | Status: DC | PRN
  Administered 2017-07-01 – 2017-07-03 (×4): 30 mL via ORAL
  Filled 2017-07-01 (×7): qty 30

## 2017-07-01 MED ORDER — INSULIN REGULAR BOLUS VIA INFUSION
0.0000 [IU] | Freq: Three times a day (TID) | INTRAVENOUS | Status: DC
  Filled 2017-07-01: qty 10

## 2017-07-01 MED ORDER — IOPAMIDOL (ISOVUE-370) INJECTION 76%
INTRAVENOUS | Status: AC
  Administered 2017-07-01: 100 mL via INTRAVENOUS
  Filled 2017-07-01: qty 100

## 2017-07-01 MED ORDER — THIAMINE HCL 100 MG PO TABS: 100.0000 mg | ORAL_TABLET | Freq: Every day | ORAL | Status: DC

## 2017-07-01 MED ORDER — DEXTROSE-NACL 5-0.45 % IV SOLN
INTRAVENOUS | Status: DC
  Administered 2017-07-02 – 2017-07-06 (×11): via INTRAVENOUS

## 2017-07-01 MED ORDER — MORPHINE SULFATE (PF) 4 MG/ML IV SOLN
4.0000 mg | Freq: Once | INTRAVENOUS | Status: AC
Start: 2017-07-01 — End: 2017-07-01
  Administered 2017-07-01: 4 mg via INTRAVENOUS
  Filled 2017-07-01: qty 1

## 2017-07-01 MED ORDER — PANTOPRAZOLE SODIUM 40 MG PO TBEC: 40.0000 mg | DELAYED_RELEASE_TABLET | Freq: Every day | ORAL | Status: DC

## 2017-07-01 MED ORDER — LORAZEPAM 2 MG/ML IJ SOLN
0.0000 mg | Freq: Four times a day (QID) | INTRAMUSCULAR | Status: AC
  Administered 2017-07-01: 1 mg via INTRAVENOUS
  Administered 2017-07-01 – 2017-07-02 (×2): 2 mg via INTRAVENOUS
  Administered 2017-07-02 (×2): 1 mg via INTRAVENOUS
  Filled 2017-07-01 (×5): qty 1

## 2017-07-01 MED ORDER — PANTOPRAZOLE SODIUM 40 MG PO TBEC
40.0000 mg | DELAYED_RELEASE_TABLET | Freq: Every day | ORAL | Status: DC
  Administered 2017-07-01 – 2017-07-06 (×6): 40 mg via ORAL
  Filled 2017-07-01 (×6): qty 1

## 2017-07-01 MED ORDER — LORAZEPAM 1 MG PO TABS
0.0000 mg | ORAL_TABLET | Freq: Four times a day (QID) | ORAL | Status: AC
  Administered 2017-07-01 – 2017-07-03 (×3): 1 mg via ORAL
  Filled 2017-07-01 (×3): qty 1

## 2017-07-01 NOTE — ED Triage Notes (Signed)
Pt BIB EMS from home for CP. Pt neighbor called for pt after he called out that he was in pain; pt spanish speaking. Per EMS, pt binge drinking since last night, denied cocaine use. Pt alert; resp e/u. N/V x 2. CBG 470 PTA. Received 324 ASA, 2 nitro, and 500 cc NS PTA. NAD at this time.

## 2017-07-01 NOTE — ED Notes (Signed)
[address] shown results of Lactic Acid ED-Lab

## 2017-07-01 NOTE — ED Notes (Signed)
Pt CBG 297 RN Jessica notified.

## 2017-07-01 NOTE — ED Provider Notes (Signed)
[facility] EMERGENCY DEPARTMENT Provider Note   CSN: [id number] Arrival date & time: 07/01/17  1537     History   Chief Complaint Chief Complaint  Patient presents with  . Chest Pain  . Alcohol Intoxication    HPI Jacob Schneider is a 41 y.o. Hispanic male with a PMHx of DM2, alcoholism, and costochondritis, who presents to the ED via EMS with complaints of alcohol intoxication and epigastric abd pain. LEVEL 5 CAVEAT DUE TO INTOXICATION/UNCOOPERATIVENESS, pt very poor historian, doesn't answer most questions and just keeps stating that his abdomen hurts.  He states that he has been binge drinking alcohol for the last 1 week, with the last sip being just prior to arrival.  He states that earlier today he developed 8/10 constant sharp epigastric pain that radiates into his chest, with no known aggravating factors, and unrelieved with 2 nitroglycerin and 323m ASA.  He reports nausea and 2 episodes of nonbloody nonbilious emesis.  He admits to snorting cocaine 2 days ago but none since then.  +Smoker.  He initially denies any other complaints, however he says yes to most of the review of systems questions.  Later on when asked specifically what complaints he has other than the ones here he mentioned, he mentions that he had one episode of nonbloody watery diarrhea prior to arrival.  He is able to deny having fevers, constipation, melena, hematochezia, obstipation, dysuria, hematuria, numbness, tingling, focal weakness, however the accuracy and validity of these denials and complaints are questionable due to the patient not being very cooperative with exam and possibly being intoxicated.  He is unable to report whether he feels short of breath or not, but he is hyperventilating and repeatedly states that his epigastrium hurts.  He does not currently have a PCP.  He denies any recent travel, sick contacts, suspicious food intake, NSAID use, or prior abdominal surgeries, although  again the validity of this is questionable. Remainder of HPI/ROS is limited due to pt's state.  Of note, chart review reveals that he was admitted for DT's in 03/2015.    The history is provided by the patient and medical records. The history is limited by the condition of the patient. A language interpreter was used (provider).  Chest Pain   Associated symptoms include abdominal pain, nausea and vomiting. Pertinent negatives include no fever, no numbness and no weakness.  Alcohol Intoxication  Associated symptoms include chest pain and abdominal pain.  Abdominal Pain   This is a new problem. Episode onset: today. The problem occurs constantly. The problem has not changed since onset.The pain is associated with alcohol use. The pain is located in the epigastric region and chest. The quality of the pain is sharp. The pain is at a severity of 8/10. The pain is moderate. Associated symptoms include diarrhea, nausea and vomiting. Pertinent negatives include fever, flatus, hematochezia, melena, constipation, dysuria and hematuria. Nothing aggravates the symptoms. Nothing relieves the symptoms.    Past Medical History:  Diagnosis Date  . Diabetes mellitus without complication ([facility]     Patient Active Problem List   Diagnosis Date Noted  . Costochondritis 04/06/2015  . DM (diabetes mellitus) type 2, uncontrolled 04/01/2015  . Hypokalemia 04/01/2015  . Hypomagnesemia 04/01/2015  . Alcohol withdrawal delirium, acute, hyperactive ([facility] 03/30/2015    Past Surgical History:  Procedure Laterality Date  . BACK SURGERY  2004       Home Medications    Prior to Admission medications   Medication  Sig Start Date End Date Taking? Authorizing Provider  blood glucose meter kit and supplies KIT Dispense based on patient and insurance preference. Use up to four times daily as directed. (FOR ICD-9 250.00, 250.01). Patient not taking: Reported on 05/17/2017 04/02/15   Mendel Corning, MD  folic acid  (FOLVITE) 1 MG tablet Take 1 tablet (1 mg total) by mouth daily. Patient not taking: Reported on 05/17/2017 04/02/15   Rai, Vernelle Emerald, MD  glimepiride (AMARYL) 2 MG tablet Take 1 tablet (2 mg total) by mouth daily with breakfast. Patient not taking: Reported on 05/17/2017 04/02/15   Rai, Vernelle Emerald, MD  hydrochlorothiazide (HYDRODIURIL) 25 MG tablet Take 1 tablet (25 mg total) daily by mouth. 05/17/17   Orlie Dakin, MD  lisinopril (PRINIVIL,ZESTRIL) 2.5 MG tablet Take 1 tablet (2.5 mg total) by mouth daily. Patient not taking: Reported on 05/17/2017 04/06/15   Arnoldo Morale, MD  LORazepam (ATIVAN) 1 MG tablet Take 1 tablet (1 mg total) by mouth every 12 (twelve) hours. Take twice a day 2 days for shakiness/anxiety, then as needed Patient not taking: Reported on 05/17/2017 04/02/15   Rai, Vernelle Emerald, MD  magnesium oxide (MAG-OX) 400 (241.3 MG) MG tablet Take 1 tablet (400 mg total) by mouth 2 (two) times daily. X 1 week Patient not taking: Reported on 04/06/2015 04/02/15   Mendel Corning, MD  metFORMIN (GLUCOPHAGE) 500 MG tablet Take 1 tablet (500 mg total) 2 (two) times daily with a meal by mouth. 05/17/17   Orlie Dakin, MD  naproxen (NAPROSYN) 250 MG tablet Take 1 tablet (250 mg total) by mouth 2 (two) times daily with a meal. X 1 week Patient not taking: Reported on 05/17/2017 04/02/15   Rai, Vernelle Emerald, MD  pantoprazole (PROTONIX) 40 MG tablet Take 1 tablet (40 mg total) by mouth daily. Any generic PPI available is okay Patient not taking: Reported on 05/17/2017 04/02/15   Rai, Vernelle Emerald, MD  thiamine 100 MG tablet Take 1 tablet (100 mg total) by mouth daily. Patient not taking: Reported on 05/17/2017 04/02/15   Rai, Vernelle Emerald, MD  traMADol (ULTRAM) 50 MG tablet Take 1 tablet (50 mg total) by mouth every 6 (six) hours as needed for moderate pain. Patient not taking: Reported on 05/17/2017 04/02/15   Mendel Corning, MD    Family History Family History  Problem Relation Age of Onset  .  Diabetes Father     Social History Social History   Tobacco Use  . Smoking status: Light Tobacco Smoker  . Smokeless tobacco: Never Used  Substance Use Topics  . Alcohol use: Yes  . Drug use: No     Allergies   Patient has no known allergies.   Review of Systems Review of Systems  Unable to perform ROS: Other  Constitutional: Negative for fever.  Cardiovascular: Positive for chest pain.  Gastrointestinal: Positive for abdominal pain, diarrhea, nausea and vomiting. Negative for blood in stool, constipation, flatus, hematochezia and melena.  Genitourinary: Negative for dysuria and hematuria.  Allergic/Immunologic: Positive for immunocompromised state (DM2).  Neurological: Negative for weakness and numbness.   LEVEL 5 CAVEAT DUE TO INTOXICATION/UNCOOPERATIVENESS  Physical Exam Updated Vital Signs  BP 121/83 (BP Location: Left Arm)   Pulse (!) 141   Temp 98.8 F (37.1 C)   Resp (!) 38   SpO2 100%    Physical Exam  Constitutional: He appears well-developed and well-nourished. He is uncooperative.  Non-toxic appearance. He appears distressed (hyperventilating, not cooperative).  Afebrile, nontoxic,  hyperventilating and not really answering questions, just keeps stating his abdomen hurts; not really cooperative with evaluation; appears possibly intoxicated.   HENT:  Head: Normocephalic and atraumatic.  Mouth/Throat: Oropharynx is clear and moist. Mucous membranes are dry.  Very dry lips  Eyes: Conjunctivae and EOM are normal. Right eye exhibits no discharge. Left eye exhibits no discharge.  Neck: Normal range of motion. Neck supple.  Cardiovascular: Regular rhythm, normal heart sounds and intact distal pulses. Tachycardia present. Exam reveals no gallop and no friction rub.  No murmur heard. Tachycardic in the 140s, reg rhythm, nl s1/s2, no m/r/g, distal pulses intact, no pedal edema   Pulmonary/Chest: Effort normal and breath sounds normal. No respiratory distress. He  has no decreased breath sounds. He has no wheezes. He has no rhonchi. He has no rales. He exhibits tenderness. He exhibits no crepitus, no deformity and no retraction.  Hyperventilating but able to be calmed down temporarily and does not appear to be in respiratory distress. CTAB in all lung fields, no w/r/r, no hypoxia or increased WOB, SpO2 100% on RA Chest wall with mild TTP over lower sternum and into epigastric area, without crepitus, deformities, or retractions     Abdominal: Soft. Normal appearance and bowel sounds are normal. He exhibits no distension and no pulsatile midline mass. There is tenderness in the epigastric area. There is no rigidity, no rebound, no guarding, no CVA tenderness, no tenderness at McBurney's point and negative Murphy's sign.  Soft, nondistended, +BS throughout, no pulsatile midline mass, with moderate epigastric TTP, no r/g/r, +pain elicited with murphy's exam but pt able to fully inspire, neg mcburney's, no CVA TTP   Musculoskeletal: Normal range of motion.  MAE x4 Strength and sensation grossly intact in all extremities Distal pulses intact and symmetric bilaterally No pedal edema, neg homan's bilaterally   Neurological: He is alert. He has normal strength. No sensory deficit.  Doesn't answer orientation questions, just repeatedly states that his abdomen hurts. Difficult to assess neurologic status due to possible intoxicated state and uncooperativeness.   Skin: Skin is warm, dry and intact. No rash noted.  Psychiatric: His mood appears anxious.  Anxious  Nursing note and vitals reviewed.  16:35:01 CIWA Alcohol Scale JH  CIWA-Ar  Nausea and Vomiting: 5  Tactile Disturbances: none  Tremor: no tremor  Auditory Disturbances: not present  Paroxysmal Sweats: beads of sweat obvious on forehead  Visual Disturbances: not present  Anxiety: moderately anxious, or guarded, so anxiety is inferred  Headache, Fullness in Head: none present  Agitation: somewhat more  than normal activity  Orientation and Clouding of Sensorium: oriented and can do serial additions  CIWA-Ar Total: 14    17:28 CIWA Alcohol Scale JH  CIWA-Ar  BP:  152/98  Pulse Rate:  140  Nausea and Vomiting: 5  Tactile Disturbances: none  Tremor: not visible, but can be felt fingertip to fingertip  Auditory Disturbances: not present  Paroxysmal Sweats: three  Visual Disturbances: moderately severe hallucinations  Anxiety: moderately anxious, or guarded, so anxiety is inferred  Headache, Fullness in Head: none present  Agitation: two  Orientation and Clouding of Sensorium: oriented and can do serial additions  CIWA-Ar Total: 19     ED Treatments / Results  Labs (all labs ordered are listed, but only abnormal results are displayed) Labs Reviewed  CBC WITH DIFFERENTIAL/PLATELET - Abnormal; Notable for the following components:      Result Value   WBC 15.8 (*)    MCV 77.2 (*)  Neutro Abs 14.6 (*)    All other components within normal limits  COMPREHENSIVE METABOLIC PANEL - Abnormal; Notable for the following components:   Sodium 132 (*)    Chloride 91 (*)    CO2 20 (*)    Glucose, Bld 414 (*)    BUN 5 (*)    Creatinine, Ser 1.78 (*)    Calcium 8.8 (*)    GFR calc non Af Amer 46 (*)    GFR calc Af Amer 53 (*)    Anion gap 21 (*)    All other components within normal limits  URINALYSIS, ROUTINE W REFLEX MICROSCOPIC - Abnormal; Notable for the following components:   Color, Urine STRAW (*)    Glucose, UA >=500 (*)    Ketones, ur 80 (*)    All other components within normal limits  RAPID URINE DRUG SCREEN, HOSP PERFORMED - Abnormal; Notable for the following components:   Opiates POSITIVE (*)    All other components within normal limits  CBG MONITORING, ED - Abnormal; Notable for the following components:   Glucose-Capillary 415 (*)    All other components within normal limits  I-STAT CG4 LACTIC ACID, ED - Abnormal; Notable for the following components:   Lactic  Acid, Venous 5.70 (*)    All other components within normal limits  I-STAT VENOUS BLOOD GAS, ED - Abnormal; Notable for the following components:   pCO2, Ven 32.1 (*)    pO2, Ven 31.0 (*)    Acid-base deficit 3.0 (*)    All other components within normal limits  LIPASE, BLOOD  ETHANOL  I-STAT TROPONIN, ED  I-STAT CG4 LACTIC ACID, ED    EKG  EKG Interpretation  Date/Time:  Saturday July 01 2017 15:46:26 EST Ventricular Rate:  143 PR Interval:    QRS Duration: 82 QT Interval:  319 QTC Calculation: 492 R Axis:   12 Text Interpretation:  Sinus tachycardia Borderline prolonged QT interval No old tracing to compare Confirmed by Alfonzo Beers [id number]) on 07/01/2017 4:29:50 PM       Radiology Ct Angio Chest/abd/pel For Dissection W And/or Wo Contrast  Result Date: 07/01/2017 CLINICAL DATA:  Chest pain EXAM: CT ANGIOGRAPHY CHEST, ABDOMEN AND PELVIS TECHNIQUE: Multidetector CT imaging through the chest, abdomen and pelvis was performed using the standard protocol during bolus administration of intravenous contrast. Multiplanar reconstructed images and MIPs were obtained and reviewed to evaluate the vascular anatomy. CONTRAST:  161m ISOVUE-370 IOPAMIDOL (ISOVUE-370) INJECTION 76% COMPARISON:  None. FINDINGS: CTA CHEST FINDINGS Cardiovascular: Thoracic aorta is well visualized without evidence of aneurysmal dilatation or dissection. No cardiac enlargement is seen. No significant coronary calcifications are noted. The pulmonary artery shows no large central embolus. Mediastinum/Nodes: Thoracic inlet is within normal limits. No significant hilar or mediastinal adenopathy is noted. Significant wall thickening is noted throughout the mid and distal esophagus likely related to reflux. Clinical correlation is recommended. Lungs/Pleura: Lungs are clear. No pleural effusion or pneumothorax. Musculoskeletal: No chest wall abnormality. No acute or significant osseous findings. Review of the MIP images  confirms the above findings. CTA ABDOMEN AND PELVIS FINDINGS VASCULAR Aorta: Normal caliber aorta without aneurysm, dissection, vasculitis or significant stenosis. Celiac: Patent without evidence of aneurysm, dissection, vasculitis or significant stenosis. SMA: Patent without evidence of aneurysm, dissection, vasculitis or significant stenosis. Renals: Both renal arteries are patent without evidence of aneurysm, dissection, vasculitis, fibromuscular dysplasia or significant stenosis. Dual renal arteries are noted on the left. IMA: Patent without evidence of aneurysm, dissection, vasculitis or significant  stenosis. Iliacs: Patent without evidence of aneurysm, dissection, vasculitis or significant stenosis. Veins: Within normal limits. Review of the MIP images confirms the above findings. NON-VASCULAR Hepatobiliary: Diffuse decreased attenuation of the liver is noted likely related fatty infiltration. No focal mass is seen. The gallbladder is within normal limits. Pancreas: Unremarkable. No pancreatic ductal dilatation or surrounding inflammatory changes. Spleen: Normal in size without focal abnormality. Adrenals/Urinary Tract: Adrenal glands are unremarkable. Kidneys are normal, without renal calculi, focal lesion, or hydronephrosis. Bladder is unremarkable. Stomach/Bowel: Stomach is within normal limits. Appendix appears normal. No evidence of bowel wall thickening, distention, or inflammatory changes. Lymphatic: No significant lymphadenopathy is noted. Reproductive: Prostate is unremarkable. Other: No abdominal wall hernia or abnormality. No abdominopelvic ascites. Musculoskeletal: No acute or significant osseous findings. Review of the MIP images confirms the above findings. IMPRESSION: No evidence of aneurysmal dilatation or dissection. No large central pulmonary embolus is noted. Diffuse wall thickening in the mid and distal esophagus which may be related to reflux. Clinical correlation is recommended. The need  for further workup can be determined on a clinical basis. Fatty liver No other focal abnormality is noted. Electronically Signed   By: Inez Catalina M.D.   On: 07/01/2017 20:05    Procedures Procedures (including critical care time)  CRITICAL CARE--DT's/EtOH withdrawal requiring multiple rechecks Performed by: Reece Agar   Total critical care time: 55 minutes  Critical care time was exclusive of separately billable procedures and treating other patients.  Critical care was necessary to treat or prevent imminent or life-threatening deterioration.  Critical care was time spent personally by me on the following activities: development of treatment plan with patient and/or surrogate as well as nursing, discussions with consultants, evaluation of patient's response to treatment, examination of patient, obtaining history from patient or surrogate, ordering and performing treatments and interventions, ordering and review of laboratory studies, ordering and review of radiographic studies, pulse oximetry and re-evaluation of patient's condition.   Medications Ordered in ED Medications  gi cocktail (Maalox,Lidocaine,Donnatal) (0 mLs Oral Hold 07/01/17 1634)  LORazepam (ATIVAN) injection 0-4 mg (1 mg Intravenous Given 07/01/17 1758)    Or  LORazepam (ATIVAN) tablet 0-4 mg ( Oral See Alternative 07/01/17 1758)  LORazepam (ATIVAN) injection 0-4 mg (not administered)    Or  LORazepam (ATIVAN) tablet 0-4 mg (not administered)  thiamine (VITAMIN B-1) tablet 100 mg ( Oral See Alternative 07/01/17 1733)    Or  thiamine (B-1) injection 100 mg (100 mg Intravenous Given 07/01/17 1733)  sodium chloride 0.9 % bolus 1,000 mL (0 mLs Intravenous Stopped 07/01/17 1711)  LORazepam (ATIVAN) injection 1 mg (1 mg Intravenous Given 07/01/17 1631)  famotidine (PEPCID) IVPB 20 mg premix (0 mg Intravenous Stopped 07/01/17 1704)  morphine 4 MG/ML injection 4 mg (4 mg Intravenous Given 07/01/17 1630)  sodium  chloride 0.9 % bolus 1,000 mL (0 mLs Intravenous Stopped 07/01/17 1800)  iopamidol (ISOVUE-370) 76 % injection (100 mLs Intravenous Contrast Given 07/01/17 1824)  sodium chloride 0.9 % bolus 1,000 mL (1,000 mLs Intravenous New Bag/Given 07/01/17 1911)  0.9 %  sodium chloride infusion ( Intravenous New Bag/Given 07/01/17 1911)     Initial Impression / Assessment and Plan / ED Course  I have reviewed the triage vital signs and the nursing notes.  Pertinent labs & imaging results that were available during my care of the patient were reviewed by me and considered in my medical decision making (see chart for details).     41 y.o. male here with EtOH  intoxication and c/o epigastric abd pain and n/v. Level 5 caveat applies due to unreliable historian. Pt says yes to almost everything, but his main complaint is his epigastric pain. On exam, pt hyperventilating, looking very anxious, won't sit still; tachycardic in the 140s; clear lung exam; chest wall tenderness along sternum, and epigastric TTP but nonperitoneal, pain elicited with murphy's sign but he's able to fully inspire. No pedal edema, neg homan's sign bilaterally. Very difficult exam and history due to pt being very uncooperative and just hyperventilating the whole time. EKG with sinus tachycardia and borderline QT prolongation, but no other acute ischemic findings. CBG 415. Will get labs and CXR, give meds to see if we can calm him down and get a better story from him. Will give fluids. Will get CIWA score as well. Once pt is calmer, and some of the labs come back, will try to figure out if we need any other imaging, but will hold off for now.   5:20 PM CIWA score 14 earlier, but hard to get a good score since he wasn't really cooperative; given 12m ativan and although he seems slightly calmer now, he appears to be hallucinating (he's pointing at the sky and says something's there). I suspect he may be in DTs; will give another 247mativan. He's  still tachycardic as well; will give more ativan and give another 1L bolus.  Trop neg. CBC w/diff with leukocytosis of 15.8 which could be from stress demargination. CMP with gluc 414, bicarb slightly low at 20, anion gap of 21, Cr 1.78; not clear if this is DKA, I suspect the anion gap could be from dehydration; will get VBG and lactic and await U/A as well. Lipase WNL. EtOH level undetectable, which makes this even more likely to be DTs/withdrawal. Given that he's not intoxicated, and he's still complaining of chest and abd pain, will obtain CTA chest/abd/pelv to r/o dissection vs other etiology. I still suspect this could be from alcoholic gastritis or something similar, but given the severe pain he's complaining of, and unreliable history, will get CT to ensure we're not missing anything. He will need admission for his DTs/withdrawal.  Discussed case with my attending Dr. MaMarcha Duttonho agrees with plan.   6:55 PM U/A with +ketones, no evidence of UTI. VBG with normal pH, doubt DKA, his anion gap is likely dehydration and lactic acid related. Lactic 5.7, second liter finished, will give another 1L bolus and give 12592mr infusion thereafter; HR now into the 120s so it's gradually improving. UDS with +cocaine. Repeat CIWA score around 5:30pm was 19 (after initial 1mg67mivan had been given 1hr prior), at which time he was given another 2mg 15mativan; he still was very agitated and apparently still hallucinating, so he was given another 1mg m89m of ativan and he finally seemed to improve, was resting much more comfortably. He states he feels better and wants to try to drink water. Awaiting CTA results, but once those return then he will need admission for his DTs/EtOH withdrawal/AKI/lactic acidemia.   8:09 PM CTA chest/abd/pelv negative for dissection/PE, has some diffuse wall thickening of mid and distal esophagus which may be related to reflux, which makes sense for this pt. Epigastric and chest pain was likely  related to alcoholic gastritis/GERD. Will proceed with admission for DTs/EtOH withdrawal/AKI/lactic acidemia.    8:28 PM Dr. David Shanon [facility] and will admit. Requests that we get repeat lactic now and repeat CBG now. Holding orders to be placed  by admitting team. Please see their notes for further documentation of care. I appreciate their help with this pleasant pt's care. Pt stable at time of admission.    Final Clinical Impressions(s) / ED Diagnoses   Final diagnoses:  Alcohol withdrawal syndrome, with delirium (HCC)  Epigastric pain  Chest pain, unspecified type  Nausea and vomiting in adult patient  Cocaine use  Tobacco user  Tachycardia  Type 2 diabetes mellitus with hyperglycemia, without long-term current use of insulin (HCC)  Lactic acidemia  AKI (acute kidney injury) ([place])  Alcoholic gastritis, presence of bleeding unspecified, unspecified chronicity  Reflux esophagitis    ED Discharge Orders    [address] [place], [place] 07/01/17 2029    Pixie Casino, MD 07/01/17 2030

## 2017-07-01 NOTE — ED Notes (Signed)
ED Provider at bedside. 

## 2017-07-01 NOTE — ED Notes (Signed)
[address] [place] VBG. ED-Lab.

## 2017-07-01 NOTE — ED Notes (Signed)
RT at bedside collecting ABG.

## 2017-07-01 NOTE — H&P (Signed)
History and Physical    Jacob Schneider RWE:[id number] DOB: 15-Jan-1976 DOA: 07/01/2017  PCP: Patient, No Pcp Per  Patient coming from:  home  Chief Complaint:  Abdominal pain  HPI: Jacob Schneider is a 41 y.o. male with medical history significant of etoh abuse, dm, noncompliant with meds comes in with epigastric abdominal pain.  Pt drinks daily but only admits to 4 beers a day.  He has been vomiting nonbloody material.  He denies fevers.  No diarrhea.  He does not take any meds for his diabetes.  He was hallucinating earlier in the ED seeing things on the ceiling, this has resolved after some ativan.  His pain is much improved also.  Pt sugar over 400 with anion gap of over 20 and lactic almost 6.  He has received about 3 liters ivf bolus.  Pt referred for admission for etoh withdrawal.  Review of Systems: As per HPI otherwise 10 point review of systems negative.   Past Medical History:  Diagnosis Date  . Diabetes mellitus without complication [facility])     Past Surgical History:  Procedure Laterality Date  . BACK SURGERY  2004     reports that he has been smoking.  he has never used smokeless tobacco. He reports that he drinks alcohol. He reports that he does not use drugs.  No Known Allergies  Family History  Problem Relation Age of Onset  . Diabetes Father     Prior to Admission medications   Medication Sig Start Date End Date Taking? Authorizing Provider  blood glucose meter kit and supplies KIT Dispense based on patient and insurance preference. Use up to four times daily as directed. (FOR ICD-9 250.00, 250.01). Patient not taking: Reported on 05/17/2017 04/02/15   Mendel Corning, MD  folic acid (FOLVITE) 1 MG tablet Take 1 tablet (1 mg total) by mouth daily. Patient not taking: Reported on 05/17/2017 04/02/15   Rai, Vernelle Emerald, MD  glimepiride (AMARYL) 2 MG tablet Take 1 tablet (2 mg total) by mouth daily with breakfast. Patient not taking: Reported on 05/17/2017 04/02/15    Rai, Vernelle Emerald, MD  hydrochlorothiazide (HYDRODIURIL) 25 MG tablet Take 1 tablet (25 mg total) daily by mouth. 05/17/17   Orlie Dakin, MD  lisinopril (PRINIVIL,ZESTRIL) 2.5 MG tablet Take 1 tablet (2.5 mg total) by mouth daily. Patient not taking: Reported on 05/17/2017 04/06/15   Arnoldo Morale, MD  LORazepam (ATIVAN) 1 MG tablet Take 1 tablet (1 mg total) by mouth every 12 (twelve) hours. Take twice a day 2 days for shakiness/anxiety, then as needed Patient not taking: Reported on 05/17/2017 04/02/15   Rai, Vernelle Emerald, MD  magnesium oxide (MAG-OX) 400 (241.3 MG) MG tablet Take 1 tablet (400 mg total) by mouth 2 (two) times daily. X 1 week Patient not taking: Reported on 04/06/2015 04/02/15   Mendel Corning, MD  metFORMIN (GLUCOPHAGE) 500 MG tablet Take 1 tablet (500 mg total) 2 (two) times daily with a meal by mouth. 05/17/17   Orlie Dakin, MD  naproxen (NAPROSYN) 250 MG tablet Take 1 tablet (250 mg total) by mouth 2 (two) times daily with a meal. X 1 week Patient not taking: Reported on 05/17/2017 04/02/15   Rai, Vernelle Emerald, MD  pantoprazole (PROTONIX) 40 MG tablet Take 1 tablet (40 mg total) by mouth daily. Any generic PPI available is okay Patient not taking: Reported on 05/17/2017 04/02/15   Rai, Vernelle Emerald, MD  thiamine 100 MG tablet Take 1 tablet (100  mg total) by mouth daily. Patient not taking: Reported on 05/17/2017 04/02/15   Rai, Vernelle Emerald, MD  traMADol (ULTRAM) 50 MG tablet Take 1 tablet (50 mg total) by mouth every 6 (six) hours as needed for moderate pain. Patient not taking: Reported on 05/17/2017 04/02/15   Mendel Corning, MD    Physical Exam: Vitals:   07/01/17 1830 07/01/17 1900 07/01/17 2030 07/01/17 2033  BP: (!) 143/86 (!) 142/93 (!) 149/102 (!) 149/102  Pulse: (!) 128 (!) 125 (!) 113 (!) 113  Resp: (!) 24 (!) 25 (!) 27   Temp:      SpO2: 96% 97% 97%       Constitutional: NAD, calm, comfortable Vitals:   07/01/17 1830 07/01/17 1900 07/01/17 2030 07/01/17  2033  BP: (!) 143/86 (!) 142/93 (!) 149/102 (!) 149/102  Pulse: (!) 128 (!) 125 (!) 113 (!) 113  Resp: (!) 24 (!) 25 (!) 27   Temp:      SpO2: 96% 97% 97%    Eyes: PERRL, lids and conjunctivae normal ENMT: Mucous membranes are moist. Posterior pharynx clear of any exudate or lesions.Normal dentition.  Neck: normal, supple, no masses, no thyromegaly Respiratory: clear to auscultation bilaterally, no wheezing, no crackles. Normal respiratory effort. No accessory muscle use.  Cardiovascular: Regular rate and rhythm, no murmurs / rubs / gallops. No extremity edema. 2+ pedal pulses. No carotid bruits.  Abdomen: no tenderness, no masses palpated. No hepatosplenomegaly. Bowel sounds positive.  Musculoskeletal: no clubbing / cyanosis. No joint deformity upper and lower extremities. Good ROM, no contractures. Normal muscle tone.  Skin: no rashes, lesions, ulcers. No induration Neurologic: CN 2-12 grossly intact. Sensation intact, DTR normal. Strength 5/5 in all 4.  Psychiatric: Normal judgment and insight. Alert and oriented x 3. Normal mood.    Labs on Admission: I have personally reviewed following labs and imaging studies  CBC: Recent Labs  Lab 07/01/17 1612  WBC 15.8*  NEUTROABS 14.6*  HGB 14.8  HCT 43.6  MCV 77.2*  PLT 458   Basic Metabolic Panel: Recent Labs  Lab 07/01/17 1612  NA 132*  K 3.7  CL 91*  CO2 20*  GLUCOSE 414*  BUN 5*  CREATININE 1.78*  CALCIUM 8.8*   GFR: CrCl cannot be calculated (Unknown ideal weight.). Liver Function Tests: Recent Labs  Lab 07/01/17 1612  AST 32  ALT 17  ALKPHOS 95  BILITOT 1.0  PROT 7.5  ALBUMIN 4.2   Recent Labs  Lab 07/01/17 1612  LIPASE 22   No results for input(s): AMMONIA in the last 168 hours. Coagulation Profile: No results for input(s): INR, PROTIME in the last 168 hours. Cardiac Enzymes: No results for input(s): CKTOTAL, CKMB, CKMBINDEX, TROPONINI in the last 168 hours. BNP (last 3 results) No results for  input(s): PROBNP in the last 8760 hours. HbA1C: No results for input(s): HGBA1C in the last 72 hours. CBG: Recent Labs  Lab 07/01/17 1555 07/01/17 2034  GLUCAP 415* 297*   Lipid Profile: No results for input(s): CHOL, HDL, LDLCALC, TRIG, CHOLHDL, LDLDIRECT in the last 72 hours. Thyroid Function Tests: No results for input(s): TSH, T4TOTAL, FREET4, T3FREE, THYROIDAB in the last 72 hours. Anemia Panel: No results for input(s): VITAMINB12, FOLATE, FERRITIN, TIBC, IRON, RETICCTPCT in the last 72 hours. Urine analysis:    Component Value Date/Time   COLORURINE STRAW (A) 07/01/2017 1705   APPEARANCEUR CLEAR 07/01/2017 1705   LABSPEC 1.028 07/01/2017 1705   PHURINE 5.0 07/01/2017 1705   GLUCOSEU >=500 (  A) 07/01/2017 1705   HGBUR NEGATIVE 07/01/2017 [place] 07/01/2017 1705   KETONESUR 80 (A) 07/01/2017 1705   PROTEINUR NEGATIVE 07/01/2017 1705   UROBILINOGEN 1.0 03/30/2015 1730   NITRITE NEGATIVE 07/01/2017 1705   LEUKOCYTESUR NEGATIVE 07/01/2017 1705   Sepsis Labs: !!!!!!!!!!!!!!!!!!!!!!!!!!!!!!!!!!!!!!!!!!!! @LABRCNTIP (procalcitonin:4,lacticidven:4) )No results found for this or any previous visit (from the past 240 hour(s)).   Radiological Exams on Admission: Ct Angio Chest/abd/pel For Dissection W And/or Wo Contrast  Result Date: 07/01/2017 CLINICAL DATA:  Chest pain EXAM: CT ANGIOGRAPHY CHEST, ABDOMEN AND PELVIS TECHNIQUE: Multidetector CT imaging through the chest, abdomen and pelvis was performed using the standard protocol during bolus administration of intravenous contrast. Multiplanar reconstructed images and MIPs were obtained and reviewed to evaluate the vascular anatomy. CONTRAST:  164m ISOVUE-370 IOPAMIDOL (ISOVUE-370) INJECTION 76% COMPARISON:  None. FINDINGS: CTA CHEST FINDINGS Cardiovascular: Thoracic aorta is well visualized without evidence of aneurysmal dilatation or dissection. No cardiac enlargement is seen. No significant coronary  calcifications are noted. The pulmonary artery shows no large central embolus. Mediastinum/Nodes: Thoracic inlet is within normal limits. No significant hilar or mediastinal adenopathy is noted. Significant wall thickening is noted throughout the mid and distal esophagus likely related to reflux. Clinical correlation is recommended. Lungs/Pleura: Lungs are clear. No pleural effusion or pneumothorax. Musculoskeletal: No chest wall abnormality. No acute or significant osseous findings. Review of the MIP images confirms the above findings. CTA ABDOMEN AND PELVIS FINDINGS VASCULAR Aorta: Normal caliber aorta without aneurysm, dissection, vasculitis or significant stenosis. Celiac: Patent without evidence of aneurysm, dissection, vasculitis or significant stenosis. SMA: Patent without evidence of aneurysm, dissection, vasculitis or significant stenosis. Renals: Both renal arteries are patent without evidence of aneurysm, dissection, vasculitis, fibromuscular dysplasia or significant stenosis. Dual renal arteries are noted on the left. IMA: Patent without evidence of aneurysm, dissection, vasculitis or significant stenosis. Iliacs: Patent without evidence of aneurysm, dissection, vasculitis or significant stenosis. Veins: Within normal limits. Review of the MIP images confirms the above findings. NON-VASCULAR Hepatobiliary: Diffuse decreased attenuation of the liver is noted likely related fatty infiltration. No focal mass is seen. The gallbladder is within normal limits. Pancreas: Unremarkable. No pancreatic ductal dilatation or surrounding inflammatory changes. Spleen: Normal in size without focal abnormality. Adrenals/Urinary Tract: Adrenal glands are unremarkable. Kidneys are normal, without renal calculi, focal lesion, or hydronephrosis. Bladder is unremarkable. Stomach/Bowel: Stomach is within normal limits. Appendix appears normal. No evidence of bowel wall thickening, distention, or inflammatory changes.  Lymphatic: No significant lymphadenopathy is noted. Reproductive: Prostate is unremarkable. Other: No abdominal wall hernia or abnormality. No abdominopelvic ascites. Musculoskeletal: No acute or significant osseous findings. Review of the MIP images confirms the above findings. IMPRESSION: No evidence of aneurysmal dilatation or dissection. No large central pulmonary embolus is noted. Diffuse wall thickening in the mid and distal esophagus which may be related to reflux. Clinical correlation is recommended. The need for further workup can be determined on a clinical basis. Fatty liver No other focal abnormality is noted. Electronically Signed   By: MInez CatalinaM.D.   On: 07/01/2017 20:05    EKG: Independently reviewed. Sinus tachy Old chart reviewed Case discussed with edp  Assessment/Plan 41yo male with DKA and etoh withdrawal syndrome  Principal Problem:   With[place]al symptoms, alcohol, [place].  Ivf.  Ativan ordered.  Has h/o significant withdrawal in 2016.  Admit to stepdown.  Mvi, thiamine , folic acid ordered.  Active Problems:   DM (diabetes mellitus) type 2,  uncontrolled- noted   DKA (diabetic ketoacidoses) ([place])- insulin drip.  dka pathway.  Gap over 20, co2 low , ketones in urine, ck abg.  Ck hga1c.  He does not take his meds, bmp q 4 hours,  Hourly glucose ck   AKI (acute kidney injury) ([place])- ivf- ua neg for infection Needs SW prior to d/c   DVT prophylaxis:  lovenox Code Status:  full Family Communication: none Disposition Plan:  Per day team Consults called:  none Admission status:  admission   DAVID,RACHAL A MD [facility]  If 7PM-7AM, please contact night-coverage [email or web address] Password [email or web address]  07/01/2017, 8:52 PM

## 2017-07-01 NOTE — ED Notes (Signed)
Patient transported to CT 

## 2017-07-01 NOTE — ED Notes (Signed)
Pt still tearful and anxious, pointing at ceiling and walls. EDP made aware of pt hallucinations. Verbal order to give 1 mg ativan IV, for a total of 4 mg IV ativan.

## 2017-07-02 ENCOUNTER — Inpatient Hospital Stay (HOSPITAL_COMMUNITY): Payer: Self-pay

## 2017-07-02 DIAGNOSIS — R000 Tachycardia, unspecified: Secondary | ICD-10-CM

## 2017-07-02 DIAGNOSIS — E1165 Type 2 diabetes mellitus with hyperglycemia: Secondary | ICD-10-CM

## 2017-07-02 DIAGNOSIS — R112 Nausea with vomiting, unspecified: Secondary | ICD-10-CM

## 2017-07-02 DIAGNOSIS — R339 Retention of urine, unspecified: Secondary | ICD-10-CM

## 2017-07-02 DIAGNOSIS — E872 Acidosis: Secondary | ICD-10-CM

## 2017-07-02 DIAGNOSIS — R1013 Epigastric pain: Secondary | ICD-10-CM

## 2017-07-02 DIAGNOSIS — E1110 Type 2 diabetes mellitus with ketoacidosis without coma: Secondary | ICD-10-CM

## 2017-07-02 LAB — BASIC METABOLIC PANEL
ANION GAP: 12 (ref 5–15)
Anion gap: 11 (ref 5–15)
BUN: <5 mg/dL — ABNORMAL LOW (ref 6–20)
CALCIUM: 7.8 mg/dL — AB (ref 8.9–10.3)
CO2: 23 mmol/L (ref 22–32)
CO2: 22 mmol/L (ref 22–32)
CREATININE: 0.91 mg/dL (ref 0.61–1.24)
Calcium: 8.5 mg/dL — ABNORMAL LOW (ref 8.9–10.3)
Chloride: 95 mmol/L — ABNORMAL LOW (ref 101–111)
Chloride: 98 mmol/L — ABNORMAL LOW (ref 101–111)
Creatinine, Ser: 0.95 mg/dL (ref 0.61–1.24)
GFR calc Af Amer: >60 mL/min (ref >60)
GFR calc Af Amer: >60 mL/min (ref >60)
GLUCOSE: 159 mg/dL — AB (ref 65–99)
Glucose, Bld: 301 mg/dL — ABNORMAL HIGH (ref 65–99)
Potassium: 2.2 mmol/L — CL (ref 3.5–5.1)
Potassium: 3.5 mmol/L (ref 3.5–5.1)
SODIUM: 131 mmol/L — AB (ref 135–145)
Sodium: 130 mmol/L — ABNORMAL LOW (ref 135–145)

## 2017-07-02 LAB — HEMOGLOBIN A1C
HEMOGLOBIN A1C: 11.7 % — AB (ref 4.8–5.6)
Mean Plasma Glucose: 289.09 mg/dL

## 2017-07-02 LAB — CBC
HCT: 38.6 % — ABNORMAL LOW (ref 39.0–52.0)
Hemoglobin: 13 g/dL (ref 13.0–17.0)
MCH: 26.2 pg (ref 26.0–34.0)
MCHC: 33.7 g/dL (ref 30.0–36.0)
MCV: 77.8 fL — AB (ref 78.0–100.0)
PLATELETS: 218 10*3/uL (ref 150–400)
RBC: 4.96 MIL/uL (ref 4.22–5.81)
RDW: 13 % (ref 11.5–15.5)
WBC: 9.9 10*3/uL (ref 4.0–10.5)

## 2017-07-02 LAB — GLUCOSE, CAPILLARY
GLUCOSE-CAPILLARY: 146 mg/dL — AB (ref 65–99)
GLUCOSE-CAPILLARY: 149 mg/dL — AB (ref 65–99)
GLUCOSE-CAPILLARY: 150 mg/dL — AB (ref 65–99)
GLUCOSE-CAPILLARY: 160 mg/dL — AB (ref 65–99)
GLUCOSE-CAPILLARY: 212 mg/dL — AB (ref 65–99)
GLUCOSE-CAPILLARY: 261 mg/dL — AB (ref 65–99)
Glucose-Capillary: 141 mg/dL — ABNORMAL HIGH (ref 65–99)
Glucose-Capillary: 152 mg/dL — ABNORMAL HIGH (ref 65–99)
Glucose-Capillary: 174 mg/dL — ABNORMAL HIGH (ref 65–99)
Glucose-Capillary: 151 mg/dL — ABNORMAL HIGH (ref 65–99)
Glucose-Capillary: 133 mg/dL — ABNORMAL HIGH (ref 65–99)
Glucose-Capillary: 165 mg/dL — ABNORMAL HIGH (ref 65–99)
Glucose-Capillary: 191 mg/dL — ABNORMAL HIGH (ref 65–99)

## 2017-07-02 LAB — LACTIC ACID, PLASMA
Lactic Acid, Venous: 2.2 mmol/L (ref 0.5–1.9)
Lactic Acid, Venous: 1.3 mmol/L (ref 0.5–1.9)

## 2017-07-02 LAB — TROPONIN I: Troponin I: <0.03 ng/mL (ref <0.03)

## 2017-07-02 LAB — HIV ANTIBODY (ROUTINE TESTING W REFLEX): HIV SCREEN 4TH GENERATION: NONREACTIVE

## 2017-07-02 LAB — MAGNESIUM
MAGNESIUM: 2 mg/dL (ref 1.7–2.4)
MAGNESIUM: 1.9 mg/dL (ref 1.7–2.4)

## 2017-07-02 MED ORDER — ACETAMINOPHEN 325 MG PO TABS
650.0000 mg | ORAL_TABLET | Freq: Four times a day (QID) | ORAL | Status: DC | PRN
  Administered 2017-07-02 – 2017-07-05 (×8): 650 mg via ORAL
  Filled 2017-07-02 (×8): qty 2

## 2017-07-02 MED ORDER — POTASSIUM CHLORIDE CRYS ER 20 MEQ PO TBCR
40.0000 meq | EXTENDED_RELEASE_TABLET | Freq: Once | ORAL | Status: AC
  Administered 2017-07-02: 40 meq via ORAL
  Filled 2017-07-02: qty 2

## 2017-07-02 MED ORDER — INSULIN ASPART 100 UNIT/ML ~~LOC~~ SOLN
0.0000 [IU] | Freq: Three times a day (TID) | SUBCUTANEOUS | Status: DC
  Administered 2017-07-02 – 2017-07-03 (×2): 5 [IU] via SUBCUTANEOUS
  Administered 2017-07-03 – 2017-07-04 (×4): 3 [IU] via SUBCUTANEOUS
  Administered 2017-07-04 – 2017-07-05 (×2): 2 [IU] via SUBCUTANEOUS
  Administered 2017-07-05: 3 [IU] via SUBCUTANEOUS
  Administered 2017-07-05 – 2017-07-06 (×3): 2 [IU] via SUBCUTANEOUS

## 2017-07-02 MED ORDER — METHOCARBAMOL 500 MG PO TABS
750.0000 mg | ORAL_TABLET | Freq: Four times a day (QID) | ORAL | Status: AC | PRN
  Administered 2017-07-02 – 2017-07-03 (×3): 750 mg via ORAL
  Filled 2017-07-02 (×3): qty 2

## 2017-07-02 MED ORDER — PROMETHAZINE HCL 25 MG/ML IJ SOLN
12.5000 mg | Freq: Four times a day (QID) | INTRAMUSCULAR | Status: DC | PRN
  Administered 2017-07-02 – 2017-07-04 (×3): 12.5 mg via INTRAVENOUS
  Filled 2017-07-02 (×3): qty 1

## 2017-07-02 MED ORDER — POTASSIUM CHLORIDE 10 MEQ/100ML IV SOLN
10.0000 meq | INTRAVENOUS | Status: AC
  Administered 2017-07-02 (×6): 10 meq via INTRAVENOUS
  Filled 2017-07-02 (×7): qty 100

## 2017-07-02 MED ORDER — TAMSULOSIN HCL 0.4 MG PO CAPS
0.4000 mg | ORAL_CAPSULE | Freq: Every day | ORAL | Status: DC
  Administered 2017-07-02 – 2017-07-06 (×5): 0.4 mg via ORAL
  Filled 2017-07-02 (×5): qty 1

## 2017-07-02 MED ORDER — INSULIN ASPART 100 UNIT/ML ~~LOC~~ SOLN
0.0000 [IU] | Freq: Every day | SUBCUTANEOUS | Status: DC
  Administered 2017-07-03 – 2017-07-04 (×2): 2 [IU] via SUBCUTANEOUS

## 2017-07-02 MED ORDER — INSULIN GLARGINE 100 UNIT/ML ~~LOC~~ SOLN
10.0000 [IU] | Freq: Once | SUBCUTANEOUS | Status: AC
  Administered 2017-07-02: 10 [IU] via SUBCUTANEOUS
  Filled 2017-07-02: qty 0.1

## 2017-07-02 NOTE — Progress Notes (Addendum)
Pt c/o headache, MD notified, new orders received.  [id number]: MD paged, bladder scan reveals greater than 1000ml, pt only able to urinate 100ml while standing at side of bed with staff assistance, new orders received.  1048: pt c/o epigastric pain, GI cocktail administered, pt report relief.  1813: MD called regarding pt cramps at legs, new orders received

## 2017-07-02 NOTE — Progress Notes (Signed)
PROGRESS NOTE    Jacob Schneider  ZOX:[id number] DOB: 1976/03/10 DOA: 07/01/2017 PCP: Patient, No Pcp Per   Chief Complaint  Patient presents with  . Chest Pain  . Alcohol Intoxication     Brief Narrative:  41 y.o. male with medical history significant of etoh abuse, dm, noncompliant with meds comes in with epigastric abdominal pain. Admitted for DKA and alcohol withdrawal.  Assessment & Plan   DKA/uncontrolled Diabetes mellitus, type II -presented with blood glucose 415, anion gap 21, and lactic acid 5.7 -patient was not been taking his home medications of metformin and glimepiride -placed on insulin drip -CO2 23, anion gap closed- however patient continues to have nausea, vomiting -will attempt to transition to lantus and ISS, with continued CBG monitoring -will order hemoglobin A1c (last was 10.9 in 2016) -continue IVF  Alcohol abuse and withdrawal -continue CIWA -admits to drinking 4-5 beers per day -continue thiamin, multivitamin, folic acid -alcohol level <10 on admission -continue to monitor closely  Abdominal pain, Nausea/Vomiting -likely secondary to DKA -Continue supportive care with antiemetics and pain control -placed on clear liquid diet  Acute kidney injury -likely due to dehydration from vomiting as well as DKA -creatinine on admission, 1.78 -placed on IVF -Creatinine improved to 0.95  Hypokalemia -will obtain magnesium level -likely due to DKA/insulin -will replace and continue to monitor BMP  Hyponatremia -due to dehydration vs hyperglycemia -continue IVF and monitor BMP  Acute urinary retention -will start flomax and obtain renal US -place foley catheter as bladder scan >1071ml  -continue to monitor I/O  DVT Prophylaxis  lovenox  Code Status: Full  Family Communication: None at bedside  Disposition Plan: Admitted, continue to monitor in stepdown  Consultants None  Procedures  None  Antibiotics   Anti-infectives (From  admission, onward)   None      Subjective:   Jacob Schneider seen and examined today. Continues to complain of nausea and vomiting. States that it had improved mildly. Has some epigastric pain. Denies current chest pain, shortness of breath, diarrhea or constipation, dizziness or headache.  Objective:   Vitals:   07/02/17 0400 07/02/17 0435 07/02/17 0511 07/02/17 0734  BP: (!) 140/92     Pulse:  (!) 103  98  Resp: (!) 22   (!) 24  Temp:      TempSrc:      SpO2: 97%   90%  Weight:      Height:    (1.702 m)     Intake/Output Summary (Last 24 hours) at 07/02/2017 1109 Last data filed at 07/02/2017 1004 Gross per 24 hour  Intake 5311.31 ml  Output 3475 ml  Net 1836.31 ml   Filed Weights   07/01/17 2210 07/02/17 0237  Weight: 75.1 kg (165 lb 9.1 oz) 75.1 kg (165 lb 9.1 oz)    Exam  General: Well developed, well nourished, NAD, appears stated age  HEENT: NCAT, mucous membranes moist.   Cardiovascular: S1 S2 auscultated, no rubs, murmurs or gallops. Regular rate and rhythm.  Respiratory: Clear to auscultation bilaterally with equal chest rise  Abdomen: Soft, mildly TTP, nondistended, + bowel sounds  Extremities: warm dry without cyanosis clubbing or edema  Neuro: AAOx3, nonfocal  Skin: Without rashes exudates or nodules  Psych: Normal affect and demeanor with intact judgement and insight   Data Reviewed: I have personally reviewed following labs and imaging studies  CBC: Recent Labs  Lab 07/01/17 1612 07/01/17 2118 07/02/17 0501  WBC 15.8* 13.2* 9.9  NEUTROABS 14.6*  --   --  HGB 14.8 13.1 13.0  HCT 43.6 39.3 38.6*  MCV 77.2* 76.6* 77.8*  PLT 253 218 218   Basic Metabolic Panel: Recent Labs  Lab 07/01/17 1612 07/01/17 2059 07/02/17 0501  NA 132* 134* 130*  K 3.7 3.4* 2.2*  CL 91* 99* 95*  CO2 20* 18* 23  GLUCOSE 414* 294* 159*  BUN 5* <5* <5*  CREATININE 1.78* 1.15 0.95  CALCIUM 8.8* 7.7* 7.8*   GFR: Estimated Creatinine Clearance:  95.7 mL/min (by C-G formula based on SCr of 0.95 mg/dL). Liver Function Tests: Recent Labs  Lab 07/01/17 1612  AST 32  ALT 17  ALKPHOS 95  BILITOT 1.0  PROT 7.5  ALBUMIN 4.2   Recent Labs  Lab 07/01/17 1612  LIPASE 22   No results for input(s): AMMONIA in the last 168 hours. Coagulation Profile: No results for input(s): INR, PROTIME in the last 168 hours. Cardiac Enzymes: Recent Labs  Lab 07/01/17 2036 07/02/17 0501  TROPONINI <0.03 <0.03   BNP (last 3 results) No results for input(s): PROBNP in the last 8760 hours. HbA1C: No results for input(s): HGBA1C in the last 72 hours. CBG: Recent Labs  Lab 07/02/17 0542 07/02/17 0646 07/02/17 0806 07/02/17 0947 07/02/17 1052  GLUCAP 174* 151* 150* 160* 133*   Lipid Profile: No results for input(s): CHOL, HDL, LDLCALC, TRIG, CHOLHDL, LDLDIRECT in the last 72 hours. Thyroid Function Tests: No results for input(s): TSH, T4TOTAL, FREET4, T3FREE, THYROIDAB in the last 72 hours. Anemia Panel: No results for input(s): VITAMINB12, FOLATE, FERRITIN, TIBC, IRON, RETICCTPCT in the last 72 hours. Urine analysis:    Component Value Date/Time   COLORURINE STRAW (A) 07/01/2017 1705   APPEARANCEUR CLEAR 07/01/2017 1705   LABSPEC 1.028 07/01/2017 1705   PHURINE 5.0 07/01/2017 1705   GLUCOSEU >=500 (A) 07/01/2017 1705   HGBUR NEGATIVE 07/01/2017 1705   BILIRUBINUR NEGATIVE 07/01/2017 1705   KETONESUR 80 (A) 07/01/2017 1705   PROTEINUR NEGATIVE 07/01/2017 1705   UROBILINOGEN 1.0 03/30/2015 1730   NITRITE NEGATIVE 07/01/2017 1705   LEUKOCYTESUR NEGATIVE 07/01/2017 1705   Sepsis Labs: (procalcitonin:4,lacticidven:4)  )No results found for this or any previous visit (from the past 240 hour(s)).    Radiology Studies: Ct Angio Chest/abd/pel For Dissection W And/or Wo Contrast  Result Date: 07/01/2017 CLINICAL DATA:  Chest pain EXAM: CT ANGIOGRAPHY CHEST, ABDOMEN AND PELVIS TECHNIQUE: Multidetector CT imaging through  the chest, abdomen and pelvis was performed using the standard protocol during bolus administration of intravenous contrast. Multiplanar reconstructed images and MIPs were obtained and reviewed to evaluate the vascular anatomy. CONTRAST:  ISOVUE-370 IOPAMIDOL (ISOVUE-370) INJECTION 76% COMPARISON:  None. FINDINGS: CTA CHEST FINDINGS Cardiovascular: Thoracic aorta is well visualized without evidence of aneurysmal dilatation or dissection. No cardiac enlargement is seen. No significant coronary calcifications are noted. The pulmonary artery shows no large central embolus. Mediastinum/Nodes: Thoracic inlet is within normal limits. No significant hilar or mediastinal adenopathy is noted. Significant wall thickening is noted throughout the mid and distal esophagus likely related to reflux. Clinical correlation is recommended. Lungs/Pleura: Lungs are clear. No pleural effusion or pneumothorax. Musculoskeletal: No chest wall abnormality. No acute or significant osseous findings. Review of the MIP images confirms the above findings. CTA ABDOMEN AND PELVIS FINDINGS VASCULAR Aorta: Normal caliber aorta without aneurysm, dissection, vasculitis or significant stenosis. Celiac: Patent without evidence of aneurysm, dissection, vasculitis or significant stenosis. SMA: Patent without evidence of aneurysm, dissection, vasculitis or significant stenosis. Renals: Both renal arteries are patent without evidence of aneurysm,  dissection, vasculitis, fibromuscular dysplasia or significant stenosis. Dual renal arteries are noted on the left. IMA: Patent without evidence of aneurysm, dissection, vasculitis or significant stenosis. Iliacs: Patent without evidence of aneurysm, dissection, vasculitis or significant stenosis. Veins: Within normal limits. Review of the MIP images confirms the above findings. NON-VASCULAR Hepatobiliary: Diffuse decreased attenuation of the liver is noted likely related fatty infiltration. No focal mass is  seen. The gallbladder is within normal limits. Pancreas: Unremarkable. No pancreatic ductal dilatation or surrounding inflammatory changes. Spleen: Normal in size without focal abnormality. Adrenals/Urinary Tract: Adrenal glands are unremarkable. Kidneys are normal, without renal calculi, focal lesion, or hydronephrosis. Bladder is unremarkable. Stomach/Bowel: Stomach is within normal limits. Appendix appears normal. No evidence of bowel wall thickening, distention, or inflammatory changes. Lymphatic: No significant lymphadenopathy is noted. Reproductive: Prostate is unremarkable. Other: No abdominal wall hernia or abnormality. No abdominopelvic ascites. Musculoskeletal: No acute or significant osseous findings. Review of the MIP images confirms the above findings. IMPRESSION: No evidence of aneurysmal dilatation or dissection. No large central pulmonary embolus is noted. Diffuse wall thickening in the mid and distal esophagus which may be related to reflux. Clinical correlation is recommended. The need for further workup can be determined on a clinical basis. Fatty liver No other focal abnormality is noted. Electronically Signed   By: Alcide CleverMark  Lukens M.D.   On: 07/01/2017 20:05     Scheduled Meds: . enoxaparin (LOVENOX) injection  40 mg Subcutaneous Q24H  . folic acid  1 mg Oral Daily  . insulin aspart  0-5 Units Subcutaneous QHS  . insulin aspart  0-9 Units Subcutaneous TID WC  . LORazepam  0-4 mg Intravenous Q6H   Or  . LORazepam  0-4 mg Oral Q6H  . [START ON 07/04/2017] LORazepam  0-4 mg Intravenous Q12H   Or  . [START ON 07/04/2017] LORazepam  0-4 mg Oral Q12H  . pantoprazole  40 mg Oral Daily  . tamsulosin  0.4 mg Oral Daily  . thiamine  100 mg Oral Daily   Or  . thiamine  100 mg Intravenous Daily   Continuous Infusions: . sodium chloride Stopped (07/02/17 0057)  . dextrose 5 % and 0.45% NaCl 100 mL/hr at 07/02/17 0405  . insulin (NOVOLIN-R) infusion 1.5 Units/hr (07/02/17 1052)  . potassium  chloride 10 mEq (07/02/17 1030)     LOS: 1 day   Time Spent in minutes   45 minutes  Tanga Gloor D.O. on 07/02/2017 at 11:09 AM  Between 7am to 7pm - Pager - [phone]  After 7pm go to [email or web address] - password [email or web address]  And look for the night coverage person covering for me after hours  [facility] Office  [phone]

## 2017-07-03 ENCOUNTER — Other Ambulatory Visit: Payer: Self-pay

## 2017-07-03 ENCOUNTER — Encounter (HOSPITAL_COMMUNITY): Payer: Self-pay | Admitting: *Deleted

## 2017-07-03 LAB — BASIC METABOLIC PANEL
Anion gap: 9 (ref 5–15)
BUN: <5 mg/dL — ABNORMAL LOW (ref 6–20)
CALCIUM: 8.7 mg/dL — AB (ref 8.9–10.3)
CO2: 23 mmol/L (ref 22–32)
CREATININE: 0.7 mg/dL (ref 0.61–1.24)
Chloride: 99 mmol/L — ABNORMAL LOW (ref 101–111)
GFR calc non Af Amer: >60 mL/min (ref >60)
Glucose, Bld: 276 mg/dL — ABNORMAL HIGH (ref 65–99)
Potassium: 3.7 mmol/L (ref 3.5–5.1)
Sodium: 131 mmol/L — ABNORMAL LOW (ref 135–145)

## 2017-07-03 LAB — GLUCOSE, CAPILLARY
GLUCOSE-CAPILLARY: 237 mg/dL — AB (ref 65–99)
GLUCOSE-CAPILLARY: 196 mg/dL — AB (ref 65–99)
GLUCOSE-CAPILLARY: 205 mg/dL — AB (ref 65–99)
Glucose-Capillary: 262 mg/dL — ABNORMAL HIGH (ref 65–99)

## 2017-07-03 LAB — MRSA PCR SCREENING: MRSA BY PCR: NEGATIVE

## 2017-07-03 MED ORDER — INSULIN STARTER KIT- SYRINGES (SPANISH)
1.0000 | Freq: Once | Status: AC
  Administered 2017-07-03: 1
  Filled 2017-07-03: qty 1

## 2017-07-03 MED ORDER — LIVING WELL WITH DIABETES BOOK - IN SPANISH
Freq: Once | Status: AC
Start: 2017-07-03 — End: 2017-07-03
  Administered 2017-07-03: 18:00:00
  Filled 2017-07-03: qty 1

## 2017-07-03 MED ORDER — INSULIN GLARGINE 100 UNIT/ML ~~LOC~~ SOLN
12.0000 [IU] | Freq: Every day | SUBCUTANEOUS | Status: DC
  Administered 2017-07-03 – 2017-07-04 (×2): 12 [IU] via SUBCUTANEOUS
  Filled 2017-07-03 (×2): qty 0.12

## 2017-07-03 MED ORDER — INFLUENZA VAC SPLIT QUAD 0.5 ML IM SUSY
0.5000 mL | PREFILLED_SYRINGE | INTRAMUSCULAR | Status: AC
  Administered 2017-07-06: 0.5 mL via INTRAMUSCULAR
  Filled 2017-07-03: qty 0.5

## 2017-07-03 MED ORDER — CHLORHEXIDINE GLUCONATE 0.12 % MT SOLN
15.0000 mL | Freq: Two times a day (BID) | OROMUCOSAL | Status: DC
  Administered 2017-07-03 – 2017-07-06 (×7): 15 mL via OROMUCOSAL
  Filled 2017-07-03 (×5): qty 15

## 2017-07-03 MED ORDER — ORAL CARE MOUTH RINSE
15.0000 mL | Freq: Two times a day (BID) | OROMUCOSAL | Status: DC
  Administered 2017-07-04 – 2017-07-06 (×3): 15 mL via OROMUCOSAL

## 2017-07-03 MED ORDER — POLYETHYLENE GLYCOL 3350 17 G PO PACK
17.0000 g | PACK | Freq: Every day | ORAL | Status: DC
  Administered 2017-07-03 – 2017-07-06 (×3): 17 g via ORAL
  Filled 2017-07-03 (×4): qty 1

## 2017-07-03 MED ORDER — KETOROLAC TROMETHAMINE 15 MG/ML IJ SOLN
15.0000 mg | Freq: Once | INTRAMUSCULAR | Status: AC
  Administered 2017-07-03: 15 mg via INTRAVENOUS
  Filled 2017-07-03: qty 1

## 2017-07-03 MED ORDER — HYDROCODONE-ACETAMINOPHEN 5-325 MG PO TABS
2.0000 | ORAL_TABLET | Freq: Once | ORAL | Status: AC
  Administered 2017-07-03: 2 via ORAL
  Filled 2017-07-03: qty 2

## 2017-07-03 NOTE — Progress Notes (Signed)
Shared AD materials with patient (spanish version) Told patient to have chaplain called if he has questions or assistance. Phebe CollaDonna S Makynli Stills, Chaplain   07/03/17 1400  Clinical Encounter Type  Visited With Patient  Visit Type Initial;Other (Comment) (shared AD forms in Spanish with patient)  Referral From Physician  Consult/Referral To Chaplain

## 2017-07-03 NOTE — Progress Notes (Signed)
Patient rested most of the day ain bed. Had lg bowel movement with small amount of emesis after breakfast with questionable red tinged emesis.  GI cocktail given for relief for stomach pain after eating x 2.  Diabetic information in Spanish given to patient.  Patient agrees to attend AA on discharge.

## 2017-07-03 NOTE — Progress Notes (Signed)
Inpatient Diabetes Program Recommendations  AACE/ADA: New Consensus Statement on Inpatient Glycemic Control (2015)  Target Ranges:  Prepandial:   less than 140 mg/dL      Peak postprandial:   less than 180 mg/dL (1-2 hours)      Critically ill patients:  140 - 180 mg/dL   Lab Results  Component Value Date   GLUCAP 237 (H) 07/03/2017   HGBA1C 11.7 (H) 07/02/2017    Review of Glycemic Control  Diabetes history: DM2 Outpatient Diabetes medications: Reports not taking Metformin 1 gm bid + Amaryl 2 mg qd Current orders for Inpatient glycemic control: Lantus 12 units + Novolog sensitive correction + hs 0-5 units  Inpatient Diabetes Program Recommendations:   -Increase Lantus slightly to 15 units (0.2 units/kg x 73.6 kg) -Novolog 4 units tid meal coverage if eats 50%  Will speak with pt. Regarding elevated A1c when feeling better.Ordered Living Well with Diabetes (Spanish version) Spanish patient education videos. Insulin starter kit (syringes) to prepare if discharged on insulin. Nurses, please begin insulin administration teaching and allow patient to give injections. May need transition to Novolin 70/30 insulin mix from [facility] if discharged on insulin due to no insurance listed. Consult to case mgt regarding No PCP and no insurance.  Thank you, Nani Gasser. Erika Slaby, RN, MSN, CDE  Diabetes Coordinator Inpatient Glycemic Control Team Team Pager [phone] (8am-5pm) 07/03/2017 12:46 PM

## 2017-07-03 NOTE — Progress Notes (Signed)
PROGRESS NOTE    Jacob Schneider  ZOX:[id number] DOB: 10/11/1975 DOA: 07/01/2017 PCP: Patient, No Pcp Per   Chief Complaint  Patient presents with  . Chest Pain  . Alcohol Intoxication    Brief Narrative:  41 y.o. male with medical history significant of etoh abuse, dm, noncompliant with meds comes in with epigastric abdominal pain. Admitted for DKA and alcohol withdrawal.  Assessment & Plan   DKA/uncontrolled Diabetes mellitus, type II -presented with blood glucose 415, anion gap 21, and lactic acid 5.7 -patient was not been taking his home medications of metformin and glimepiride -CO2 23, anion gap closed- however patient continues to have nausea, vomiting -transitioned off of insulin drip and placed on lantus with sliding scale  -Hemoglobin A1c 11.7 -continue IVF -will consult diabetes coordinator  Alcohol abuse and withdrawal -continue CIWA -admits to drinking 4-5 beers per day -continue thiamin, multivitamin, folic acid -alcohol level <10 on admission -continue to monitor closely  Abdominal pain, Nausea/Vomiting -likely secondary to DKA -Continue supportive care with PPI, antiemetics, GI cocktail, and pain control -placed on clear liquid diet  Acute kidney injury -likely due to dehydration from vomiting as well as DKA -creatinine on admission, 1.78 -placed on IVF -Creatinine improved to 0.70  Hypokalemia -Resolved, magnesium level 1.9 -likely due to DKA/insulin -Continue to monitor BMP  Hyponatremia -due to dehydration vs hyperglycemia -continue IVF and monitor BMP  Acute urinary retention -Renal US: no evidence of hydronephrosis  -place foley catheter as bladder scan >103300ml  -started on flomax -will try voiding trial in 24 hours -continue to monitor I/O  DVT Prophylaxis  lovenox  Code Status: Full  Family Communication: None at bedside  Disposition Plan: Admitted, continue to monitor in stepdown  Consultants None  Procedures   None  Antibiotics   Anti-infectives (From admission, onward)   None      Subjective:   Jacob Schneider seen and examined today. Continues to complain of nausea and vomiting, and abdominal pain. Does not feel like eating, but wants to continue liquids. Complains of epigastric pain. Denies chest pain, shortness of breath, diarrhea or constipation, dizziness or headache.  Objective:   Vitals:   07/02/17 2300 07/03/17 0400 07/03/17 0417 07/03/17 0734  BP: (!) 139/100 (!) 158/117 (!) 158/117 (!) 139/121  Pulse: (!) 112 93  96  Resp: 18 17  15   Temp: (!) 97.5 F (36.4 C)  98.2 F (36.8 C) 98.2 F (36.8 C)  TempSrc: Oral  Oral Oral  SpO2: 98% 94%  96%  Weight:   73.6 kg (162 lb 4.1 oz)   Height:        Intake/Output Summary (Last 24 hours) at 07/03/2017 [id number] Last data filed at 07/03/2017 0700 Gross per 24 hour  Intake 5030.58 ml  Output 8450 ml  Net -3419.42 ml   Filed Weights   07/01/17 2210 07/02/17 0237 07/03/17 0417  Weight: 75.1 kg (165 lb 9.1 oz) 75.1 kg (165 lb 9.1 oz) 73.6 kg (162 lb 4.1 oz)    Exam  General: Well developed, well nourished, NAD, appears stated age  HEENT: NCAT, mucous membranes moist.   Cardiovascular: S1 S2 auscultated, RRR, no murmurs  Respiratory: Clear to auscultation bilaterally with equal chest rise, no rhonchi or wheezing   Abdomen: Soft, mildly TTP, nondistended, + bowel sounds  Extremities: warm dry without cyanosis clubbing or edema  Neuro: AAOx3, nonfocal  Psych: Appropriate mood and affect   Data Reviewed: I have personally reviewed following labs and imaging studies  CBC:  Recent Labs  Lab 07/01/17 1612 07/01/17 2118 07/02/17 0501  WBC 15.8* 13.2* 9.9  NEUTROABS 14.6*  --   --   HGB 14.8 13.1 13.0  HCT 43.6 39.3 38.6*  MCV 77.2* 76.6* 77.8*  PLT 253 218 218   Basic Metabolic Panel: Recent Labs  Lab 07/01/17 1612 07/01/17 2059 07/02/17 0501 07/02/17 1843 07/03/17 0444  NA 132* 134* 130* 131* 131*  K 3.7  3.4* 2.2* 3.5 3.7  CL 91* 99* 95* 98* 99*  CO2 20* 18* 23 22 23   GLUCOSE 414* 294* 159* 301* 276*  BUN 5* <5* <5* <5* <5*  CREATININE 1.78* 1.15 0.95 0.91 0.70  CALCIUM 8.8* 7.7* 7.8* 8.5* 8.7*  MG  --   --  2.0 1.9  --    GFR: Estimated Creatinine Clearance: 113.6 mL/min (by C-G formula based on SCr of 0.7 mg/dL). Liver Function Tests: Recent Labs  Lab 07/01/17 1612  AST 32  ALT 17  ALKPHOS 95  BILITOT 1.0  PROT 7.5  ALBUMIN 4.2   Recent Labs  Lab 07/01/17 1612  LIPASE 22   No results for input(s): AMMONIA in the last 168 hours. Coagulation Profile: No results for input(s): INR, PROTIME in the last 168 hours. Cardiac Enzymes: Recent Labs  Lab 07/01/17 2036 07/02/17 0501 07/02/17 1053  TROPONINI <0.03 <0.03 <0.03   BNP (last 3 results) No results for input(s): PROBNP in the last 8760 hours. HbA1C: Recent Labs    07/02/17 0501  HGBA1C 11.7*   CBG: Recent Labs  Lab 07/02/17 1052 07/02/17 1150 07/02/17 1714 07/02/17 2021 07/03/17 0739  GLUCAP 133* 165* 261* 191* 262*   Lipid Profile: No results for input(s): CHOL, HDL, LDLCALC, TRIG, CHOLHDL, LDLDIRECT in the last 72 hours. Thyroid Function Tests: No results for input(s): TSH, T4TOTAL, FREET4, T3FREE, THYROIDAB in the last 72 hours. Anemia Panel: No results for input(s): VITAMINB12, FOLATE, FERRITIN, TIBC, IRON, RETICCTPCT in the last 72 hours. Urine analysis:    Component Value Date/Time   COLORURINE STRAW (A) 07/01/2017 1705   APPEARANCEUR CLEAR 07/01/2017 1705   LABSPEC 1.028 07/01/2017 1705   PHURINE 5.0 07/01/2017 1705   GLUCOSEU >=500 (A) 07/01/2017 1705   HGBUR NEGATIVE 07/01/2017 1705   BILIRUBINUR NEGATIVE 07/01/2017 1705   KETONESUR 80 (A) 07/01/2017 1705   PROTEINUR NEGATIVE 07/01/2017 1705   UROBILINOGEN 1.0 03/30/2015 1730   NITRITE NEGATIVE 07/01/2017 1705   LEUKOCYTESUR NEGATIVE 07/01/2017 1705   Sepsis Labs: @LABRCNTIP (procalcitonin:4,lacticidven:4)  )No results found for  this or any previous visit (from the past 240 hour(s)).    Radiology Studies: US Renal  Result Date: 07/03/2017 CLINICAL DATA:  Acute onset of urinary retention. Acute renal insufficiency. EXAM: RENAL / URINARY TRACT ULTRASOUND COMPLETE COMPARISON:  Abdominal ultrasound performed 03/30/2015 FINDINGS: Right Kidney: Length: 13.8 cm. Echogenicity within normal limits. No mass or hydronephrosis visualized. Left Kidney: Length: 13.1 cm. There is question of mildly increased parenchymal echogenicity. No mass or hydronephrosis visualized. Bladder: Decompressed, with a Foley catheter in place. IMPRESSION: 1. No evidence of hydronephrosis. 2. Question of mildly increased renal parenchymal echogenicity, which could reflect mild medical renal disease. Electronically Signed   By: Roanna Raider M.D.   On: 07/03/2017 01:29   Ct Angio Chest/abd/pel For Dissection W And/or Wo Contrast  Result Date: 07/01/2017 CLINICAL DATA:  Chest pain EXAM: CT ANGIOGRAPHY CHEST, ABDOMEN AND PELVIS TECHNIQUE: Multidetector CT imaging through the chest, abdomen and pelvis was performed using the standard protocol during bolus administration of intravenous contrast. Multiplanar reconstructed  images and MIPs were obtained and reviewed to evaluate the vascular anatomy. CONTRAST:  100mL ISOVUE-370 IOPAMIDOL (ISOVUE-370) INJECTION 76% COMPARISON:  None. FINDINGS: CTA CHEST FINDINGS Cardiovascular: Thoracic aorta is well visualized without evidence of aneurysmal dilatation or dissection. No cardiac enlargement is seen. No significant coronary calcifications are noted. The pulmonary artery shows no large central embolus. Mediastinum/Nodes: Thoracic inlet is within normal limits. No significant hilar or mediastinal adenopathy is noted. Significant wall thickening is noted throughout the mid and distal esophagus likely related to reflux. Clinical correlation is recommended. Lungs/Pleura: Lungs are clear. No pleural effusion or pneumothorax.  Musculoskeletal: No chest wall abnormality. No acute or significant osseous findings. Review of the MIP images confirms the above findings. CTA ABDOMEN AND PELVIS FINDINGS VASCULAR Aorta: Normal caliber aorta without aneurysm, dissection, vasculitis or significant stenosis. Celiac: Patent without evidence of aneurysm, dissection, vasculitis or significant stenosis. SMA: Patent without evidence of aneurysm, dissection, vasculitis or significant stenosis. Renals: Both renal arteries are patent without evidence of aneurysm, dissection, vasculitis, fibromuscular dysplasia or significant stenosis. Dual renal arteries are noted on the left. IMA: Patent without evidence of aneurysm, dissection, vasculitis or significant stenosis. Iliacs: Patent without evidence of aneurysm, dissection, vasculitis or significant stenosis. Veins: Within normal limits. Review of the MIP images confirms the above findings. NON-VASCULAR Hepatobiliary: Diffuse decreased attenuation of the liver is noted likely related fatty infiltration. No focal mass is seen. The gallbladder is within normal limits. Pancreas: Unremarkable. No pancreatic ductal dilatation or surrounding inflammatory changes. Spleen: Normal in size without focal abnormality. Adrenals/Urinary Tract: Adrenal glands are unremarkable. Kidneys are normal, without renal calculi, focal lesion, or hydronephrosis. Bladder is unremarkable. Stomach/Bowel: Stomach is within normal limits. Appendix appears normal. No evidence of bowel wall thickening, distention, or inflammatory changes. Lymphatic: No significant lymphadenopathy is noted. Reproductive: Prostate is unremarkable. Other: No abdominal wall hernia or abnormality. No abdominopelvic ascites. Musculoskeletal: No acute or significant osseous findings. Review of the MIP images confirms the above findings. IMPRESSION: No evidence of aneurysmal dilatation or dissection. No large central pulmonary embolus is noted. Diffuse wall thickening  in the mid and distal esophagus which may be related to reflux. Clinical correlation is recommended. The need for further workup can be determined on a clinical basis. Fatty liver No other focal abnormality is noted. Electronically Signed   By: Alcide CleverMark  Lukens M.D.   On: 07/01/2017 20:05     Scheduled Meds: . chlorhexidine  15 mL Mouth Rinse BID  . enoxaparin (LOVENOX) injection  40 mg Subcutaneous Q24H  . folic acid  1 mg Oral Daily  . [START ON 07/04/2017] Influenza vac split quadrivalent PF  0.5 mL Intramuscular Tomorrow-1000  . insulin aspart  0-5 Units Subcutaneous QHS  . insulin aspart  0-9 Units Subcutaneous TID WC  . insulin glargine  12 Units Subcutaneous Daily  . LORazepam  0-4 mg Intravenous Q6H   Or  . LORazepam  0-4 mg Oral Q6H  . [START ON 07/04/2017] LORazepam  0-4 mg Intravenous Q12H   Or  . [START ON 07/04/2017] LORazepam  0-4 mg Oral Q12H  . mouth rinse  15 mL Mouth Rinse q12n4p  . pantoprazole  40 mg Oral Daily  . polyethylene glycol  17 g Oral Daily  . tamsulosin  0.4 mg Oral Daily  . thiamine  100 mg Oral Daily   Or  . thiamine  100 mg Intravenous Daily   Continuous Infusions: . sodium chloride Stopped (07/02/17 0057)  . dextrose 5 % and 0.45% NaCl 100  mL/hr at 07/03/17 0245  . insulin (NOVOLIN-R) infusion Stopped (07/02/17 1153)     LOS: 2 days   Time Spent in minutes   45 minutes  Maryna Yeagle D.O. on 07/03/2017 at 8:22 AM  Between 7am to 7pm - Pager - [phone]  After 7pm go to [email or web address] - password [email or web address]  And look for the night coverage person covering for me after hours  [facility] Office  [phone]

## 2017-07-04 LAB — BASIC METABOLIC PANEL
ANION GAP: 9 (ref 5–15)
BUN: <5 mg/dL — ABNORMAL LOW (ref 6–20)
CALCIUM: 8.8 mg/dL — AB (ref 8.9–10.3)
CO2: 22 mmol/L (ref 22–32)
Chloride: 100 mmol/L — ABNORMAL LOW (ref 101–111)
Creatinine, Ser: 0.45 mg/dL — ABNORMAL LOW (ref 0.61–1.24)
GLUCOSE: 215 mg/dL — AB (ref 65–99)
POTASSIUM: 3.4 mmol/L — AB (ref 3.5–5.1)
SODIUM: 131 mmol/L — AB (ref 135–145)

## 2017-07-04 LAB — GLUCOSE, CAPILLARY
GLUCOSE-CAPILLARY: 202 mg/dL — AB (ref 65–99)
GLUCOSE-CAPILLARY: 211 mg/dL — AB (ref 65–99)
GLUCOSE-CAPILLARY: 158 mg/dL — AB (ref 65–99)
GLUCOSE-CAPILLARY: 218 mg/dL — AB (ref 65–99)

## 2017-07-04 LAB — MAGNESIUM: MAGNESIUM: 1.8 mg/dL (ref 1.7–2.4)

## 2017-07-04 MED ORDER — HYDRALAZINE HCL 20 MG/ML IJ SOLN
10.0000 mg | Freq: Four times a day (QID) | INTRAMUSCULAR | Status: DC | PRN
  Administered 2017-07-04 – 2017-07-05 (×3): 10 mg via INTRAVENOUS
  Filled 2017-07-04 (×3): qty 1

## 2017-07-04 MED ORDER — ALUM & MAG HYDROXIDE-SIMETH 200-200-20 MG/5ML PO SUSP: 15.0000 mL | ORAL | Status: DC | PRN

## 2017-07-04 MED ORDER — INSULIN GLARGINE 100 UNIT/ML ~~LOC~~ SOLN
15.0000 [IU] | Freq: Every day | SUBCUTANEOUS | Status: DC
  Administered 2017-07-05 – 2017-07-06 (×2): 15 [IU] via SUBCUTANEOUS
  Filled 2017-07-04 (×2): qty 0.15

## 2017-07-04 NOTE — Progress Notes (Signed)
Patient had a very restless night.  At beginning of shift patient complained of epigastric pain and pain in his feet.  Medication PRN was given, upon reassessment patient was curled into a fetal position crying inconsolably . Schorr, NP: notified and new orders for pain medication was given.  On Reassessment patient was asleep. Around 0000 patient had a episode of pink colored emesis. Patient was given antiemetic with some relive. Rest of the night patient was complaining of pain in his feet.  Will continue to [employer]

## 2017-07-04 NOTE — Progress Notes (Signed)
PROGRESS NOTE    Jacob Schneider  BJY:[id number] DOB: 09-26-1975 DOA: 07/01/2017 PCP: Patient, No Pcp Per   Chief Complaint  Patient presents with  . Chest Pain  . Alcohol Intoxication    Brief Narrative:  42 y.o. male with medical history significant of etoh abuse, dm, noncompliant with meds comes in with epigastric abdominal pain. Admitted for DKA and alcohol withdrawal.  Assessment & Plan   DKA/uncontrolled Diabetes mellitus, type II -presented with blood glucose 415, anion gap 21, and lactic acid 5.7 -patient was not been taking his home medications of metformin and glimepiride -CO2 23, anion gap closed- however patient continues to have nausea, vomiting -transitioned off of insulin drip and placed on lantus with sliding scale  -will increase lantus today to 15u -Hemoglobin A1c 11.7 -continue IVF -will consult diabetes coordinator  Alcohol abuse and withdrawal -continue CIWA -admits to drinking 4-5 beers per day -continue thiamin, multivitamin, folic acid -alcohol level <10 on admission -continue to monitor closely  Abdominal pain, Nausea/Vomiting -likely secondary to DKA -Continue supportive care with PPI, antiemetics, GI cocktail, and pain control -placed on clear liquid diet- will advance to full liquid   Acute kidney injury -likely due to dehydration from vomiting as well as DKA -creatinine on admission, 1.78 -placed on IVF -Creatinine improved to 0.70  Hypokalemia -Resolved, magnesium level 1.9 -likely due to DKA/insulin -BMP pending   Hyponatremia -due to dehydration vs hyperglycemia -continue IVF -BMP pending   Acute urinary retention -Renal US: no evidence of hydronephrosis  -place foley catheter as bladder scan >1020ml  -started on flomax -will try voiding trial today -continue to monitor I/O  DVT Prophylaxis  lovenox  Code Status: Full  Family Communication: None at bedside  Disposition Plan: Admitted. Continue to monitor    Consultants None  Procedures  None  Antibiotics   Anti-infectives (From admission, onward)   None      Subjective:   Jacob Schneider seen and examined today. Continues to have abdominal pain, states his abdomen is sore. Has some nausea. Able to tolerate liquids.  Denies chest pain, shortness of breath, diarrhea or constipation, dizziness or headache.  Objective:   Vitals:   07/04/17 0400 07/04/17 0600 07/04/17 0750 07/04/17 0900  BP: (!) 146/95  106/86 (!) 130/95  Pulse: 96  100 (!) 105  Resp: 17  17 (!) 21  Temp: 98.4 F (36.9 C)  98.3 F (36.8 C)   TempSrc: Oral  Oral   SpO2: 97%  97% 100%  Weight:  72.7 kg (160 lb 4.4 oz)    Height:        Intake/Output Summary (Last 24 hours) at 07/04/2017 1101 Last data filed at 07/04/2017 0900 Gross per 24 hour  Intake 5240 ml  Output 5125 ml  Net 115 ml   Filed Weights   07/02/17 0237 07/03/17 0417 07/04/17 0600  Weight: 75.1 kg (165 lb 9.1 oz) 73.6 kg (162 lb 4.1 oz) 72.7 kg (160 lb 4.4 oz)    Exam  General: Well developed, well nourished, NAD  HEENT: NCAT, mucous membranes moist.   Cardiovascular: S1 S2 auscultated, RRR, no murmurs  Respiratory: clear to auscultation   Abdomen: Soft, diffuse mild tenderness, nondistended, + bowel sounds  Extremities: warm dry without cyanosis clubbing or edema  Neuro: AAOx3, nonfocal  Psych: Appropriate mood and affect, pleasant    Data Reviewed: I have personally reviewed following labs and imaging studies  CBC: Recent Labs  Lab 07/01/17 1612 07/01/17 2118 07/02/17 0501  WBC  15.8* 13.2* 9.9  NEUTROABS 14.6*  --   --   HGB 14.8 13.1 13.0  HCT 43.6 39.3 38.6*  MCV 77.2* 76.6* 77.8*  PLT 253 218 218   Basic Metabolic Panel: Recent Labs  Lab 07/01/17 1612 07/01/17 2059 07/02/17 0501 07/02/17 1843 07/03/17 0444  NA 132* 134* 130* 131* 131*  K 3.7 3.4* 2.2* 3.5 3.7  CL 91* 99* 95* 98* 99*  CO2 20* 18* 23 22 23   GLUCOSE 414* 294* 159* 301* 276*  BUN 5* <5*  <5* <5* <5*  CREATININE 1.78* 1.15 0.95 0.91 0.70  CALCIUM 8.8* 7.7* 7.8* 8.5* 8.7*  MG  --   --  2.0 1.9  --    GFR: Estimated Creatinine Clearance: 113.6 mL/min (by C-G formula based on SCr of 0.7 mg/dL). Liver Function Tests: Recent Labs  Lab 07/01/17 1612  AST 32  ALT 17  ALKPHOS 95  BILITOT 1.0  PROT 7.5  ALBUMIN 4.2   Recent Labs  Lab 07/01/17 1612  LIPASE 22   No results for input(s): AMMONIA in the last 168 hours. Coagulation Profile: No results for input(s): INR, PROTIME in the last 168 hours. Cardiac Enzymes: Recent Labs  Lab 07/01/17 2036 07/02/17 0501 07/02/17 1053  TROPONINI <0.03 <0.03 <0.03   BNP (last 3 results) No results for input(s): PROBNP in the last 8760 hours. HbA1C: Recent Labs    07/02/17 0501  HGBA1C 11.7*   CBG: Recent Labs  Lab 07/03/17 0739 07/03/17 1140 07/03/17 1557 07/03/17 1955 07/04/17 0753  GLUCAP 262* 237* 196* 205* 202*   Lipid Profile: No results for input(s): CHOL, HDL, LDLCALC, TRIG, CHOLHDL, LDLDIRECT in the last 72 hours. Thyroid Function Tests: No results for input(s): TSH, T4TOTAL, FREET4, T3FREE, THYROIDAB in the last 72 hours. Anemia Panel: No results for input(s): VITAMINB12, FOLATE, FERRITIN, TIBC, IRON, RETICCTPCT in the last 72 hours. Urine analysis:    Component Value Date/Time   COLORURINE STRAW (A) 07/01/2017 1705   APPEARANCEUR CLEAR 07/01/2017 1705   LABSPEC 1.028 07/01/2017 1705   PHURINE 5.0 07/01/2017 1705   GLUCOSEU >=500 (A) 07/01/2017 1705   HGBUR NEGATIVE 07/01/2017 1705   BILIRUBINUR NEGATIVE 07/01/2017 1705   KETONESUR 80 (A) 07/01/2017 1705   PROTEINUR NEGATIVE 07/01/2017 1705   UROBILINOGEN 1.0 03/30/2015 1730   NITRITE NEGATIVE 07/01/2017 1705   LEUKOCYTESUR NEGATIVE 07/01/2017 1705   Sepsis Labs: @LABRCNTIP (procalcitonin:4,lacticidven:4)  ) Recent Results (from the past 240 hour(s))  MRSA PCR Screening     Status: None   Collection Time: 07/03/17 12:11 PM  Result Value  Ref Range Status   MRSA by PCR NEGATIVE NEGATIVE Final    Comment:        The GeneXpert MRSA Assay (FDA approved for NASAL specimens only), is one component of a comprehensive MRSA colonization surveillance program. It is not intended to diagnose MRSA infection nor to guide or monitor treatment for MRSA infections.       Radiology Studies: [place] Renal  Result Date: 07/03/2017 CLINICAL DATA:  Acute onset of urinary retention. Acute renal insufficiency. EXAM: RENAL / URINARY TRACT ULTRASOUND COMPLETE COMPARISON:  Abdominal ultrasound performed 03/30/2015 FINDINGS: Right Kidney: Length: 13.8 cm. Echogenicity within normal limits. No mass or hydronephrosis visualized. Left Kidney: Length: 13.1 cm. There is question of mildly increased parenchymal echogenicity. No mass or hydronephrosis visualized. Bladder: Decompressed, with a Foley catheter in place. IMPRESSION: 1. No evidence of hydronephrosis. 2. Question of mildly increased renal parenchymal echogenicity, which could reflect mild medical renal disease. Electronically  Signed   By: Roanna Raider M.D.   On: 07/03/2017 01:29     Scheduled Meds: . chlorhexidine  15 mL Mouth Rinse BID  . enoxaparin (LOVENOX) injection  40 mg Subcutaneous Q24H  . folic acid  1 mg Oral Daily  . Influenza vac split quadrivalent PF  0.5 mL Intramuscular Tomorrow-1000  . insulin aspart  0-5 Units Subcutaneous QHS  . insulin aspart  0-9 Units Subcutaneous TID WC  . insulin glargine  12 Units Subcutaneous Daily  . LORazepam  0-4 mg Intravenous Q12H   Or  . LORazepam  0-4 mg Oral Q12H  . mouth rinse  15 mL Mouth Rinse q12n4p  . pantoprazole  40 mg Oral Daily  . polyethylene glycol  17 g Oral Daily  . tamsulosin  0.4 mg Oral Daily  . thiamine  100 mg Oral Daily   Or  . thiamine  100 mg Intravenous Daily   Continuous Infusions: . sodium chloride Stopped (07/02/17 0057)  . dextrose 5 % and 0.45% NaCl 100 mL/hr at 07/04/17 0656  . insulin (NOVOLIN-R)  infusion Stopped (07/02/17 1153)     LOS: 3 days   Time Spent in minutes   45 minutes  Alisen Marsiglia D.O. on 07/04/2017 at 11:01 AM  Between 7am to 7pm - Pager - [phone]  After 7pm go to [email or web address] - password [email or web address]  And look for the night coverage person covering for me after hours  [facility] Office  [phone]

## 2017-07-05 LAB — BASIC METABOLIC PANEL
ANION GAP: 9 (ref 5–15)
BUN: <5 mg/dL — ABNORMAL LOW (ref 6–20)
CHLORIDE: 100 mmol/L — AB (ref 101–111)
CO2: 23 mmol/L (ref 22–32)
Calcium: 8.7 mg/dL — ABNORMAL LOW (ref 8.9–10.3)
Creatinine, Ser: 0.51 mg/dL — ABNORMAL LOW (ref 0.61–1.24)
GFR calc non Af Amer: >60 mL/min (ref >60)
Glucose, Bld: 214 mg/dL — ABNORMAL HIGH (ref 65–99)
Potassium: 3.1 mmol/L — ABNORMAL LOW (ref 3.5–5.1)
SODIUM: 132 mmol/L — AB (ref 135–145)

## 2017-07-05 LAB — GLUCOSE, CAPILLARY
GLUCOSE-CAPILLARY: 175 mg/dL — AB (ref 65–99)
GLUCOSE-CAPILLARY: 243 mg/dL — AB (ref 65–99)
Glucose-Capillary: 184 mg/dL — ABNORMAL HIGH (ref 65–99)
Glucose-Capillary: 165 mg/dL — ABNORMAL HIGH (ref 65–99)
Glucose-Capillary: 197 mg/dL — ABNORMAL HIGH (ref 65–99)

## 2017-07-05 LAB — CBC
HEMATOCRIT: 42.1 % (ref 39.0–52.0)
Hemoglobin: 14 g/dL (ref 13.0–17.0)
MCH: 26.3 pg (ref 26.0–34.0)
MCHC: 33.3 g/dL (ref 30.0–36.0)
MCV: 79 fL (ref 78.0–100.0)
Platelets: 159 10*3/uL (ref 150–400)
RBC: 5.33 MIL/uL (ref 4.22–5.81)
RDW: 13 % (ref 11.5–15.5)
WBC: 6 10*3/uL (ref 4.0–10.5)

## 2017-07-05 LAB — MAGNESIUM: MAGNESIUM: 1.9 mg/dL (ref 1.7–2.4)

## 2017-07-05 MED ORDER — POTASSIUM CHLORIDE CRYS ER 20 MEQ PO TBCR
40.0000 meq | EXTENDED_RELEASE_TABLET | Freq: Once | ORAL | Status: AC
  Administered 2017-07-05: 40 meq via ORAL
  Filled 2017-07-05: qty 2

## 2017-07-05 MED ORDER — HYDROCHLOROTHIAZIDE 25 MG PO TABS
25.0000 mg | ORAL_TABLET | Freq: Every day | ORAL | Status: DC
  Administered 2017-07-05 – 2017-07-06 (×2): 25 mg via ORAL
  Filled 2017-07-05 (×2): qty 1

## 2017-07-05 MED ORDER — CYCLOBENZAPRINE HCL 5 MG PO TABS
5.0000 mg | ORAL_TABLET | Freq: Three times a day (TID) | ORAL | Status: DC | PRN
  Administered 2017-07-05 (×2): 5 mg via ORAL
  Filled 2017-07-05 (×2): qty 1

## 2017-07-05 MED ORDER — LISINOPRIL 5 MG PO TABS
2.5000 mg | ORAL_TABLET | Freq: Every day | ORAL | Status: DC
  Administered 2017-07-05 – 2017-07-06 (×2): 2.5 mg via ORAL
  Filled 2017-07-05 (×2): qty 1

## 2017-07-05 MED ORDER — ALUM & MAG HYDROXIDE-SIMETH 200-200-20 MG/5ML PO SUSP: 15.0000 mL | ORAL | Status: DC | PRN

## 2017-07-05 NOTE — Progress Notes (Signed)
Called to give report to [phone]  Was not able to give report.

## 2017-07-05 NOTE — Progress Notes (Addendum)
Inpatient Diabetes Program Recommendations  AACE/ADA: New Consensus Statement on Inpatient Glycemic Control (2015)  Target Ranges:  Prepandial:   less than 140 mg/dL      Peak postprandial:   less than 180 mg/dL (1-2 hours)      Critically ill patients:  140 - 180 mg/dL   Lab Results  Component Value Date   GLUCAP 175 (H) 07/05/2017   HGBA1C 11.7 (H) 07/02/2017    Review of Glycemic ControlResults for Jacob Schneider, [name] (MRN [id number]) as of 07/05/2017 14:57  Ref. Range 07/04/2017 12:30 07/04/2017 17:10 07/04/2017 20:16 07/05/2017 07:39 07/05/2017 13:15  Glucose-Capillary Latest Ref Range: 65 - 99 mg/dL 409211 (H) 811158 (H) 914218 (H) 184 (H) 175 (H)    Diabetes history: Type 2 DM Outpatient Diabetes medications: Amaryl 2 mg with breakfast, Metformin 500 mg bid Current orders for Inpatient glycemic control:  Novolog sensitive tid with meals and HS, Lantus 15 units daily  Inpatient Diabetes Program Recommendations:    Spoke with patient using [employer] interpreter.  Discussed DM management.  He states that he was diagnosed with DM this year and placed on insulin for 6 months.  The last month he has been taking "pills" for DM instead of insulin.  We discussed elevated A1C and the likely need for him to resume insulin .  He states that someone told him that insulin has "long term" side effects if you take it too long.  I told patient that there are no long-term effects of taking insulin and we discussed the potential for low blood sugars and dangers.  Patient is familiar with insulin administration and sites using insulin syringe.  Patient initially said that he had never had a low blood sugar, however after I described the signs and symptoms he states that he does have that "sometimes" if his blood sugar is 80.  He states that he has candy that he keeps with him in case he feels low.  Patient had meter when he lived in [place], however no longer has one.  Told him about "[employer]-reli[employer]on" meter.  We also discussed  normal blood sugar values and I gave him "spanish" handout on DM survival skills including hypoglycemia, hyperglycemia, monitoring, diet, insulin administration, etc. Patient does report burning and pain in his feet and states that someone said this could be from DM.  We discussed diabetic neuropathy and the importance of foot care.  I also asked him to talk to his MD regarding his feet/foot pain. Will follow up with patient on 07/06/16.   Note that patient does not have insurance for insulin.  Therefore may need affordable insulin regimen such as Novolin 70/30 (reli-on brand) which [facility] 24.88[facility] per vial.  May consider 70/30 10 units bid with meals instead of Novolog/Lantus due to cost.  Case management consult pending. Patient will need PCP for management of DM.   Thanks, Beryl MeagerJenny Taeler Winning, RN, BC-ADM Inpatient Diabetes Coordinator Pager [phone] (8a-5p)

## 2017-07-05 NOTE — Progress Notes (Addendum)
PROGRESS NOTE    SHAIDEN ALDOUS  WUJ:[id number] DOB: 1976-02-29 DOA: 07/01/2017 PCP: Patient, No Pcp Per   Chief Complaint  Patient presents with  . Chest Pain  . Alcohol Intoxication    Brief Narrative:  42 y.o. male with medical history significant of etoh abuse, dm, noncompliant with meds comes in with epigastric abdominal pain. Admitted for DKA and alcohol withdrawal. DKA resolved.   Assessment & Plan   DKA/uncontrolled Diabetes mellitus, type II -presented with blood glucose 415, anion gap 21, and lactic acid 5.7 -patient was not been taking his home medications of metformin and glimepiride -CO2 23, anion gap closed- however patient continues to have nausea, vomiting -transitioned off of insulin drip and placed on lantus with sliding scale -Hemoglobin A1c 11.7 -continue IVF -Diabetes coordinator following  Alcohol abuse and withdrawal -currently stable, no signs or symptoms of withdrawal -continue CIWA -admits to drinking 4-5 beers per day -continue thiamin, multivitamin, folic acid -alcohol level <10 on admission -continue to monitor closely  Abdominal pain, Nausea/Vomiting -likely secondary to DKA -Continue supportive care with PPI, antiemetics, GI cocktail, and pain control -tolerated full liquids, will transition to soft diet -suspect patient may also have a degree of gastroparesis, will continue to monitor closely  Acute kidney injury -likely due to dehydration from vomiting as well as DKA -creatinine on admission, 1.78 -placed on IVF -Creatinine improved to 0.51  Essential hypertension -will restart lisinopril/HCTZ -continue IV hydralazine PRN  Hypokalemia -Continue to replace, currently 3.1 -magnesium level pending  -likely due to DKA/insulin -Continue to monitor BMP  Hyponatremia -due to dehydration vs hyperglycemia -continue IVF -continue to monitor BMP  Acute urinary retention -Renal US: no evidence of hydronephrosis  -place foley catheter  as bladder scan >1062ml  -started on flomax -resolved, foley catheter removed, passed voiding trial  -continue to monitor I/O   Foot cramps -potassium 3.1, replacing -magnesium pending -added flexeril to aid with spams/cramps  DVT Prophylaxis  lovenox  Code Status: Full  Family Communication: None at bedside  Disposition Plan: Admitted. Continue to monitor, will transfer to medical floor and continue to monitor. Case management consulted for PCP/medication assitance  Consultants None  Procedures  None  Antibiotics   Anti-infectives (From admission, onward)   None      Subjective:   [name] seen and examined today. Feels abdominal soreness has improved. No longer having nausea/vomiting. Complains of foot cramping and pain.  Denies chest pain, shortness of breath, diarrhea or constipation, dizziness or headache.  Objective:   Vitals:   07/05/17 0600 07/05/17 0700 07/05/17 0741 07/05/17 0900  BP: (!) 146/109 (!) 122/94 (!) 145/111 124/81  Pulse: 95 90 92 (!) 111  Resp: 16 14 (!) 22 (!) 24  Temp:   97.6 F (36.4 C)   TempSrc:   Oral   SpO2: 97% 98% 99% 98%  Weight:      Height:        Intake/Output Summary (Last 24 hours) at 07/05/2017 1000 Last data filed at 07/05/2017 0415 Gross per 24 hour  Intake 2160 ml  Output 2000 ml  Net 160 ml   Filed Weights   07/03/17 0417 07/04/17 0600 07/05/17 0500  Weight: 73.6 kg (162 lb 4.1 oz) 72.7 kg (160 lb 4.4 oz) 76 kg (167 lb 8.8 oz)    Exam  General: Well developed, well nourished, NAD  HEENT: NCAT, mucous membranes moist.   Cardiovascular: S1 S2 auscultated, tachycardic, no murmurs  Respiratory: clear to auscultation, no wheezing or rhonchi  Abdomen: Soft, diffuse mild tenderness, nondistended, + bowel sounds  Extremities: warm dry without cyanosis clubbing or edema  Neuro: AAOx3, nonfocal  Psych: Appropriate mood and affect, pleasant    Data Reviewed: I have personally reviewed following labs and  imaging studies  CBC: Recent Labs  Lab 07/01/17 1612 07/01/17 2118 07/02/17 0501 07/05/17 0603  WBC 15.8* 13.2* 9.9 6.0  NEUTROABS 14.6*  --   --   --   HGB 14.8 13.1 13.0 14.0  HCT 43.6 39.3 38.6* 42.1  MCV 77.2* 76.6* 77.8* 79.0  PLT 253 218 218 159   Basic Metabolic Panel: Recent Labs  Lab 07/02/17 0501 07/02/17 1843 07/03/17 0444 07/04/17 1119 07/05/17 0603  NA 130* 131* 131* 131* 132*  K 2.2* 3.5 3.7 3.4* 3.1*  CL 95* 98* 99* 100* 100*  CO2 23 22 23 22 23   GLUCOSE 159* 301* 276* 215* 214*  BUN <5* <5* <5* <5* <5*  CREATININE 0.95 0.91 0.70 0.45* 0.51*  CALCIUM 7.8* 8.5* 8.7* 8.8* 8.7*  MG 2.0 1.9  --  1.8  --    GFR: Estimated Creatinine Clearance: 113.6 mL/min (A) (by C-G formula based on SCr of 0.51 mg/dL (L)). Liver Function Tests: Recent Labs  Lab 07/01/17 1612  AST 32  ALT 17  ALKPHOS 95  BILITOT 1.0  PROT 7.5  ALBUMIN 4.2   Recent Labs  Lab 07/01/17 1612  LIPASE 22   No results for input(s): AMMONIA in the last 168 hours. Coagulation Profile: No results for input(s): INR, PROTIME in the last 168 hours. Cardiac Enzymes: Recent Labs  Lab 07/01/17 2036 07/02/17 0501 07/02/17 1053  TROPONINI <0.03 <0.03 <0.03   BNP (last 3 results) No results for input(s): PROBNP in the last 8760 hours. HbA1C: No results for input(s): HGBA1C in the last 72 hours. CBG: Recent Labs  Lab 07/04/17 0753 07/04/17 1230 07/04/17 1710 07/04/17 2016 07/05/17 0739  GLUCAP 202* 211* 158* 218* 184*   Lipid Profile: No results for input(s): CHOL, HDL, LDLCALC, TRIG, CHOLHDL, LDLDIRECT in the last 72 hours. Thyroid Function Tests: No results for input(s): TSH, T4TOTAL, FREET4, T3FREE, THYROIDAB in the last 72 hours. Anemia Panel: No results for input(s): VITAMINB12, FOLATE, FERRITIN, TIBC, IRON, RETICCTPCT in the last 72 hours. Urine analysis:    Component Value Date/Time   COLORURINE STRAW (A) 07/01/2017 1705   APPEARANCEUR CLEAR 07/01/2017 1705    LABSPEC 1.028 07/01/2017 1705   PHURINE 5.0 07/01/2017 1705   GLUCOSEU >=500 (A) 07/01/2017 1705   HGBUR NEGATIVE 07/01/2017 1705   BILIRUBINUR NEGATIVE 07/01/2017 1705   KETONESUR 80 (A) 07/01/2017 1705   PROTEINUR NEGATIVE 07/01/2017 1705   UROBILINOGEN 1.0 03/30/2015 1730   NITRITE NEGATIVE 07/01/2017 1705   LEUKOCYTESUR NEGATIVE 07/01/2017 1705   Sepsis Labs: @LABRCNTIP (procalcitonin:4,lacticidven:4)  ) Recent Results (from the past 240 hour(s))  MRSA PCR Screening     Status: None   Collection Time: 07/03/17 12:11 PM  Result Value Ref Range Status   MRSA by PCR NEGATIVE NEGATIVE Final    Comment:        The GeneXpert MRSA Assay (FDA approved for NASAL specimens only), is one component of a comprehensive MRSA colonization surveillance program. It is not intended to diagnose MRSA infection nor to guide or monitor treatment for MRSA infections.       Radiology Studies: No results found.   Scheduled Meds: . chlorhexidine  15 mL Mouth Rinse BID  . enoxaparin (LOVENOX) injection  40 mg Subcutaneous Q24H  . folic  acid  1 mg Oral Daily  . Influenza vac split quadrivalent PF  0.5 mL Intramuscular Tomorrow-1000  . insulin aspart  0-5 Units Subcutaneous QHS  . insulin aspart  0-9 Units Subcutaneous TID WC  . insulin glargine  15 Units Subcutaneous Daily  . LORazepam  0-4 mg Intravenous Q12H   Or  . LORazepam  0-4 mg Oral Q12H  . mouth rinse  15 mL Mouth Rinse q12n4p  . pantoprazole  40 mg Oral Daily  . polyethylene glycol  17 g Oral Daily  . tamsulosin  0.4 mg Oral Daily  . thiamine  100 mg Oral Daily   Or  . thiamine  100 mg Intravenous Daily   Continuous Infusions: . sodium chloride Stopped (07/02/17 0057)  . dextrose 5 % and 0.45% NaCl 100 mL/hr at 07/05/17 0700  . insulin (NOVOLIN-R) infusion Stopped (07/02/17 1153)     LOS: 4 days   Time Spent in minutes   45 minutes  Brayten Komar D.O. on 07/05/2017 at 10:00 AM  Between 7am to 7pm - Pager -  [phone]  After 7pm go to [email or web address] - password [email or web address]  And look for the night coverage person covering for me after hours  [facility] Office  [phone]

## 2017-07-06 DIAGNOSIS — F1490 Cocaine use, unspecified, uncomplicated: Secondary | ICD-10-CM

## 2017-07-06 DIAGNOSIS — K2920 Alcoholic gastritis without bleeding: Secondary | ICD-10-CM

## 2017-07-06 DIAGNOSIS — F10231 Alcohol dependence with withdrawal delirium: Principal | ICD-10-CM

## 2017-07-06 DIAGNOSIS — R079 Chest pain, unspecified: Secondary | ICD-10-CM

## 2017-07-06 DIAGNOSIS — N179 Acute kidney failure, unspecified: Secondary | ICD-10-CM

## 2017-07-06 LAB — BASIC METABOLIC PANEL
Anion gap: 9 (ref 5–15)
BUN: 5 mg/dL — AB (ref 6–20)
CO2: 25 mmol/L (ref 22–32)
CREATININE: 0.52 mg/dL — AB (ref 0.61–1.24)
Calcium: 9.1 mg/dL (ref 8.9–10.3)
Chloride: 100 mmol/L — ABNORMAL LOW (ref 101–111)
GFR calc Af Amer: >60 mL/min (ref >60)
Glucose, Bld: 179 mg/dL — ABNORMAL HIGH (ref 65–99)
Potassium: 3.7 mmol/L (ref 3.5–5.1)
SODIUM: 134 mmol/L — AB (ref 135–145)

## 2017-07-06 LAB — CBC
HCT: 43.4 % (ref 39.0–52.0)
Hemoglobin: 14.4 g/dL (ref 13.0–17.0)
MCH: 26.5 pg (ref 26.0–34.0)
MCHC: 33.2 g/dL (ref 30.0–36.0)
MCV: 79.8 fL (ref 78.0–100.0)
PLATELETS: 164 10*3/uL (ref 150–400)
RBC: 5.44 MIL/uL (ref 4.22–5.81)
RDW: 13.2 % (ref 11.5–15.5)
WBC: 5.8 10*3/uL (ref 4.0–10.5)

## 2017-07-06 LAB — GLUCOSE, CAPILLARY
GLUCOSE-CAPILLARY: 172 mg/dL — AB (ref 65–99)
Glucose-Capillary: 195 mg/dL — ABNORMAL HIGH (ref 65–99)

## 2017-07-06 MED ORDER — HYDROCHLOROTHIAZIDE 25 MG PO TABS: 25.0000 mg | ORAL_TABLET | Freq: Every day | ORAL | 0 refills | Status: DC

## 2017-07-06 MED ORDER — TAMSULOSIN HCL 0.4 MG PO CAPS: 0.4000 mg | ORAL_CAPSULE | Freq: Every day | ORAL | 0 refills | Status: DC

## 2017-07-06 MED ORDER — TRAMADOL HCL 50 MG PO TABS: 50.0000 mg | ORAL_TABLET | Freq: Three times a day (TID) | ORAL | 0 refills | Status: DC | PRN

## 2017-07-06 MED ORDER — BLOOD GLUCOSE MONITOR KIT: PACK | 0 refills | Status: DC

## 2017-07-06 MED ORDER — INSULIN NPH ISOPHANE & REGULAR (70-30) 100 UNIT/ML ~~LOC~~ SUSP: 10.0000 [IU] | Freq: Two times a day (BID) | SUBCUTANEOUS | 3 refills | Status: DC

## 2017-07-06 MED ORDER — INSULIN SYRINGES (DISPOSABLE) U-100 0.5 ML MISC: 1 refills | Status: DC

## 2017-07-06 MED ORDER — CYCLOBENZAPRINE HCL 5 MG PO TABS: 5.0000 mg | ORAL_TABLET | Freq: Three times a day (TID) | ORAL | 0 refills | Status: DC | PRN

## 2017-07-06 MED ORDER — LISINOPRIL 2.5 MG PO TABS: 2.5000 mg | ORAL_TABLET | Freq: Every day | ORAL | 1 refills | Status: DC

## 2017-07-06 NOTE — Care Management Note (Signed)
Case Management Note  Patient Details  Name: Jacob Schneider MRN: [id number] Date of Birth: 04/11/1976  Subjective/Objective:                    Action/Plan: MATCH letter and follow up appointment explained by interpreter [name]   and given to patient .  Patient voiced understanding.  Expected Discharge Date:  07/06/17               Expected Discharge Plan:  Home/Self Care  In-House Referral:     Discharge planning Services  CM Consult, MATCH Program, [facility], Medication Assistance, Follow-up appt scheduled  Post Acute Care Choice:  NA Choice offered to:  Patient  DME Arranged:    DME Agency:     HH Arranged:    HH Agency:     Status of Service:  Completed, signed off  If discussed at [employer] Meetings, dates discussed:    Additional Comments:  Kingsley PlanWile, [name] Marie, RN 07/06/2017, 12:31 PM

## 2017-07-06 NOTE — Progress Notes (Signed)
Rusty AusAlfredo A Carrozza to be D/C'd Home per MD order.  Discussed with the patient and all questions fully answered via spanish translator  VSS, Skin clean, dry and intact without evidence of skin break down, no evidence of skin tears noted. IV catheter discontinued intact. Site without signs and symptoms of complications. Dressing and pressure applied.  An After Visit Summary was printed and given to the patient. Patient received prescription.  D/c education completed with patient/family including follow up instructions, medication list, d/c activities limitations if indicated, with other d/c instructions as indicated by MD - patient able to verbalize understanding, all questions fully answered.   Patient instructed to return to ED, call 911, or call MD for any changes in condition.   Patient escorted via WC, and D/C home via private auto.  Caren HazyKamila A Dia Jefferys 07/06/2017 11:56 AM

## 2017-07-06 NOTE — Discharge Summary (Addendum)
Physician Discharge Summary   Patient ID: Jacob Schneider MRN: [id number] DOB/AGE: [birth year] 42 y.o.  Admit date: 07/01/2017 Discharge date: 07/06/2017  Primary Care Physician:  Patient, No Pcp Per  Discharge Diagnoses:    . DM (diabetes mellitus) type 2, uncontrolled . DKA (diabetic ketoacidoses) ([place]) . AKI (acute kidney injury) ([place]) Alcohol abuse and withdrawal Nausea, vomiting abdominal pain Essential hypertension Acute urinary retention Foot cramps  Consults:  Diabetic coordinator   Recommendations for Outpatient Follow-up:  1. Case management assisted with Match letter for meds,  follow-up appointment 2. Please repeat CBC/BMET at next visit, hemoglobin A1c   DIET: Carb modified diet    Allergies:  No Known Allergies   DISCHARGE MEDICATIONS: Allergies as of 07/06/2017   No Known Allergies     Medication List    STOP taking these medications   folic acid 1 MG tablet Commonly known as:  FOLVITE   glimepiride 2 MG tablet Commonly known as:  AMARYL   LORazepam 1 MG tablet Commonly known as:  ATIVAN   magnesium oxide 400 (241.3 Mg) MG tablet Commonly known as:  MAG-OX   metFORMIN 500 MG tablet Commonly known as:  GLUCOPHAGE   naproxen 250 MG tablet Commonly known as:  NAPROSYN   pantoprazole 40 MG tablet Commonly known as:  PROTONIX   thiamine 100 MG tablet     TAKE these medications   blood glucose meter kit and supplies Kit Dispense based on patient and insurance preference. Use up to four times daily as directed. (FOR ICD-9 250.00, 250.01).   cyclobenzaprine 5 MG tablet Commonly known as:  FLEXERIL Take 1 tablet (5 mg total) by mouth 3 (three) times daily as needed for muscle spasms.   hydrochlorothiazide 25 MG tablet Commonly known as:  HYDRODIURIL Take 1 tablet (25 mg total) by mouth daily.   insulin NPH-regular Human (70-30) 100 UNIT/ML injection Commonly known as:  NOVOLIN 70/30 Inject 10 Units into the skin 2 (two) times daily  with a meal.   Insulin Syringes (Disposable) U-100 0.5 ML Misc Take insulin as directed   lisinopril 2.5 MG tablet Commonly known as:  PRINIVIL,ZESTRIL Take 1 tablet (2.5 mg total) by mouth daily.   tamsulosin 0.4 MG Caps capsule Commonly known as:  FLOMAX Take 1 capsule (0.4 mg total) by mouth daily. Start taking on:  07/07/2017   traMADol 50 MG tablet Commonly known as:  ULTRAM Take 1 tablet (50 mg total) by mouth every 8 (eight) hours as needed for moderate pain. What changed:  when to take this        Brief H and P: For complete details please refer to admission H and P, but in brief 42 y.o.malewith medical history significant ofetoh abuse, dm, noncompliant with meds comes in with epigastric abdominal pain. Admitted for DKA and alcohol withdrawal. DKA resolved.   Hospital Course:   DKA with uncontrolled diabetes, type II Presented with blood glucose of 415, anion gap of 21, lactic acid 5.7.-  -Patient was not taking his home medications including metformin and Amaryl hemoglobin A1c 11.7.  -  Patient was initially placed on aggressive IV fluid hydration, insulin drip.  Subsequently transitioned to subcutaneous insulin. -Diabetic coordinator was consulted recommended NPH insulin 70/30, 10 units twice a day due to lower cost.  Patient was recommended to be compliant with his treatment plan.     Alcohol withdrawal syndrome, with delirium ([place]) -Currently stable, no symptoms of withdrawal Patient was placed on- CIWA with ativan, thiamine, folate -Patient  strongly counseled for alcohol cessation  Nausea, vomiting, abdominal pain -Resolved, likely due to DKA, tolerating diet, may have some degree of gastroparesis    AKI (acute kidney injury) ([place]) Likely due to dehydration from vomiting and DKA.  Creatinine on admission 1.78-  -Improved to 0.5 at the time of discharge.  Essential hypertension Currently stable, continue lisinopril/HCTZ  Hyponatremia Due to dehydration  and pseudohyponatremia secondary to hyperglycemia, improved   Acute urinary retention Resolved, renal ultrasound showed no evidence of hydronephrosis Started on Flomax Foley catheter removed, passed voiding trial  Foot cramps possibly due to hypokalemia Improving, continue Flexeril, pain control  Day of Discharge Feels a lot better today, feet pain improving  BP 113/72 (BP Location: Left Arm)   Pulse 90   Temp 98.1 F (36.7 C) (Oral)   Resp 18   Ht 5' 7"  (1.702 m)   Wt 76.5 kg (168 lb 10.4 oz)   SpO2 98%   BMI 26.41 kg/m   Physical Exam: General: Alert and awake oriented x3 not in any acute distress. HEENT: anicteric sclera, pupils reactive to light and accommodation CVS: S1-S2 clear no murmur rubs or gallops Chest: clear to auscultation bilaterally, no wheezing rales or rhonchi Abdomen: soft nontender, nondistended, normal bowel sounds Extremities: no cyanosis, clubbing or edema noted bilaterally Neuro: Cranial nerves II-XII intact, no focal neurological deficits   The results of significant diagnostics from this hospitalization (including imaging, microbiology, ancillary and laboratory) are listed below for reference.    LAB RESULTS: Basic Metabolic Panel: Recent Labs  Lab 07/05/17 0603 07/05/17 0650 07/06/17 0451  NA 132*  --  134*  K 3.1*  --  3.7  CL 100*  --  100*  CO2 23  --  25  GLUCOSE 214*  --  179*  BUN <5*  --  5*  CREATININE 0.51*  --  0.52*  CALCIUM 8.7*  --  9.1  MG  --  1.9  --    Liver Function Tests: Recent Labs  Lab 07/01/17 1612  AST 32  ALT 17  ALKPHOS 95  BILITOT 1.0  PROT 7.5  ALBUMIN 4.2   Recent Labs  Lab 07/01/17 1612  LIPASE 22   No results for input(s): AMMONIA in the last 168 hours. CBC: Recent Labs  Lab 07/01/17 1612  07/05/17 0603 07/06/17 0451  WBC 15.8*   < > 6.0 5.8  NEUTROABS 14.6*  --   --   --   HGB 14.8   < > 14.0 14.4  HCT 43.6   < > 42.1 43.4  MCV 77.2*   < > 79.0 79.8  PLT 253   < > 159 164   <  > = values in this interval not displayed.   Cardiac Enzymes: Recent Labs  Lab 07/02/17 0501 07/02/17 1053  TROPONINI <0.03 <0.03   BNP: Invalid input(s): POCBNP CBG: Recent Labs  Lab 07/06/17 0749 07/06/17 1146  GLUCAP 195* 172*    Significant Diagnostic Studies:  US Renal  Result Date: 2017-07-14 CLINICAL DATA:  Acute onset of urinary retention. Acute renal insufficiency. EXAM: RENAL / URINARY TRACT ULTRASOUND COMPLETE COMPARISON:  Abdominal ultrasound performed 03/30/2015 FINDINGS: Right Kidney: Length: 13.8 cm. Echogenicity within normal limits. No mass or hydronephrosis visualized. Left Kidney: Length: 13.1 cm. There is question of mildly increased parenchymal echogenicity. No mass or hydronephrosis visualized. Bladder: Decompressed, with a Foley catheter in place. IMPRESSION: 1. No evidence of hydronephrosis. 2. Question of mildly increased renal parenchymal echogenicity, which could reflect mild  medical renal disease. Electronically Signed   By: Garald Balding M.D.   On: 07/03/2017 01:29   Ct Angio Chest/abd/pel For Dissection W And/or Wo Contrast  Result Date: 07/01/2017 CLINICAL DATA:  Chest pain EXAM: CT ANGIOGRAPHY CHEST, ABDOMEN AND PELVIS TECHNIQUE: Multidetector CT imaging through the chest, abdomen and pelvis was performed using the standard protocol during bolus administration of intravenous contrast. Multiplanar reconstructed images and MIPs were obtained and reviewed to evaluate the vascular anatomy. CONTRAST:  143m ISOVUE-370 IOPAMIDOL (ISOVUE-370) INJECTION 76% COMPARISON:  None. FINDINGS: CTA CHEST FINDINGS Cardiovascular: Thoracic aorta is well visualized without evidence of aneurysmal dilatation or dissection. No cardiac enlargement is seen. No significant coronary calcifications are noted. The pulmonary artery shows no large central embolus. Mediastinum/Nodes: Thoracic inlet is within normal limits. No significant hilar or mediastinal adenopathy is noted.  Significant wall thickening is noted throughout the mid and distal esophagus likely related to reflux. Clinical correlation is recommended. Lungs/Pleura: Lungs are clear. No pleural effusion or pneumothorax. Musculoskeletal: No chest wall abnormality. No acute or significant osseous findings. Review of the MIP images confirms the above findings. CTA ABDOMEN AND PELVIS FINDINGS VASCULAR Aorta: Normal caliber aorta without aneurysm, dissection, vasculitis or significant stenosis. Celiac: Patent without evidence of aneurysm, dissection, vasculitis or significant stenosis. SMA: Patent without evidence of aneurysm, dissection, vasculitis or significant stenosis. Renals: Both renal arteries are patent without evidence of aneurysm, dissection, vasculitis, fibromuscular dysplasia or significant stenosis. Dual renal arteries are noted on the left. IMA: Patent without evidence of aneurysm, dissection, vasculitis or significant stenosis. Iliacs: Patent without evidence of aneurysm, dissection, vasculitis or significant stenosis. Veins: Within normal limits. Review of the MIP images confirms the above findings. NON-VASCULAR Hepatobiliary: Diffuse decreased attenuation of the liver is noted likely related fatty infiltration. No focal mass is seen. The gallbladder is within normal limits. Pancreas: Unremarkable. No pancreatic ductal dilatation or surrounding inflammatory changes. Spleen: Normal in size without focal abnormality. Adrenals/Urinary Tract: Adrenal glands are unremarkable. Kidneys are normal, without renal calculi, focal lesion, or hydronephrosis. Bladder is unremarkable. Stomach/Bowel: Stomach is within normal limits. Appendix appears normal. No evidence of bowel wall thickening, distention, or inflammatory changes. Lymphatic: No significant lymphadenopathy is noted. Reproductive: Prostate is unremarkable. Other: No abdominal wall hernia or abnormality. No abdominopelvic ascites. Musculoskeletal: No acute or  significant osseous findings. Review of the MIP images confirms the above findings. IMPRESSION: No evidence of aneurysmal dilatation or dissection. No large central pulmonary embolus is noted. Diffuse wall thickening in the mid and distal esophagus which may be related to reflux. Clinical correlation is recommended. The need for further workup can be determined on a clinical basis. Fatty liver No other focal abnormality is noted. Electronically Signed   By: MInez CatalinaM.D.   On: 07/01/2017 20:05    2D ECHO:   Disposition and Follow-up: Discharge Instructions    Diet Carb Modified   Complete by:  As directed    Discharge instructions   Complete by:  As directed    It is VERY IMPORTANT that you follow up with a PCP on a regular basis.  Check your blood glucoses before each meal and at bedtime and maintain a log of your readings.  Bring this log with you when you follow up with your PCP so that he or she can adjust your insulin at your follow up visit.   Increase activity slowly   Complete by:  As directed        DISPOSITION:home    DISCHARGE FOLLOW-UP  Follow-up Information    [place]. Go to.   Why:  Follow up appointment on July 14, 2017 at 0900.  Contact information: [place] [phone]           Time spent on Discharge: 1mns   Signed:   REstill CottaM.D. [facility] 07/06/2017, 2:28 PM Pager: [phone]

## 2017-07-06 NOTE — Progress Notes (Addendum)
Patient is a transfer from [facility] [address].  He arrived on unit alert and oriented and in no acute distress. He speaks very little english and interpreter video was used to communicate.  Vital sign, skin assessment, assessment and telemetry placement completed.  Pt is in bed resting.  Will continue to monitor.

## 2017-07-06 NOTE — Progress Notes (Signed)
Spanish speaking translator used to communicate with patient their needs, medication, and orders. Pt understands and will continue to monitor.

## 2017-07-14 ENCOUNTER — Ambulatory Visit: Payer: Self-pay | Admitting: Family Medicine

## 2017-07-30 ENCOUNTER — Emergency Department (HOSPITAL_COMMUNITY): Payer: Self-pay

## 2017-07-30 ENCOUNTER — Encounter (HOSPITAL_COMMUNITY): Payer: Self-pay

## 2017-07-30 ENCOUNTER — Inpatient Hospital Stay (HOSPITAL_COMMUNITY)
Admission: EM | Admit: 2017-07-30 | Discharge: 2017-08-04 | DRG: 638 | Disposition: A | Discharge status: Home / Self Care | Payer: Self-pay | Attending: Family Medicine | Admitting: Family Medicine

## 2017-07-30 DIAGNOSIS — F10939 Alcohol use, unspecified with withdrawal, unspecified: Secondary | ICD-10-CM

## 2017-07-30 DIAGNOSIS — E119 Type 2 diabetes mellitus without complications: Secondary | ICD-10-CM | POA: Diagnosis present

## 2017-07-30 DIAGNOSIS — Z91148 Patient's other noncompliance with medication regimen for other reason: Secondary | ICD-10-CM

## 2017-07-30 DIAGNOSIS — F1010 Alcohol abuse, uncomplicated: Secondary | ICD-10-CM

## 2017-07-30 DIAGNOSIS — N179 Acute kidney failure, unspecified: Secondary | ICD-10-CM | POA: Diagnosis present

## 2017-07-30 DIAGNOSIS — F10239 Alcohol dependence with withdrawal, unspecified: Secondary | ICD-10-CM | POA: Diagnosis present

## 2017-07-30 DIAGNOSIS — Z9114 Patient's other noncompliance with medication regimen: Secondary | ICD-10-CM

## 2017-07-30 DIAGNOSIS — E876 Hypokalemia: Secondary | ICD-10-CM | POA: Diagnosis present

## 2017-07-30 DIAGNOSIS — R079 Chest pain, unspecified: Secondary | ICD-10-CM

## 2017-07-30 DIAGNOSIS — F10931 Alcohol use, unspecified with withdrawal delirium: Secondary | ICD-10-CM | POA: Diagnosis present

## 2017-07-30 DIAGNOSIS — K1120 Sialoadenitis, unspecified: Secondary | ICD-10-CM | POA: Diagnosis present

## 2017-07-30 DIAGNOSIS — E1110 Type 2 diabetes mellitus with ketoacidosis without coma: Secondary | ICD-10-CM | POA: Diagnosis present

## 2017-07-30 DIAGNOSIS — Z833 Family history of diabetes mellitus: Secondary | ICD-10-CM

## 2017-07-30 DIAGNOSIS — R000 Tachycardia, unspecified: Secondary | ICD-10-CM

## 2017-07-30 DIAGNOSIS — F10231 Alcohol dependence with withdrawal delirium: Secondary | ICD-10-CM | POA: Diagnosis present

## 2017-07-30 DIAGNOSIS — Z9119 Patient's noncompliance with other medical treatment and regimen: Secondary | ICD-10-CM

## 2017-07-30 DIAGNOSIS — Z794 Long term (current) use of insulin: Secondary | ICD-10-CM

## 2017-07-30 HISTORY — DX: Low back pain: M54.5

## 2017-07-30 HISTORY — DX: Reserved for concepts with insufficient information to code with codable children: IMO0002

## 2017-07-30 HISTORY — DX: Type 2 diabetes mellitus with ketoacidosis without coma: E11.10

## 2017-07-30 HISTORY — DX: Type 2 diabetes mellitus with hyperglycemia: E11.65

## 2017-07-30 HISTORY — DX: Essential (primary) hypertension: I10

## 2017-07-30 HISTORY — DX: Personal history of peptic ulcer disease: Z87.11

## 2017-07-30 HISTORY — DX: Anxiety disorder, unspecified: F41.9

## 2017-07-30 HISTORY — DX: Personal history of other diseases of the digestive system: Z87.19

## 2017-07-30 HISTORY — DX: Other chronic pain: G89.29

## 2017-07-30 HISTORY — DX: Low back pain, unspecified: M54.50

## 2017-07-30 LAB — CBC
HEMATOCRIT: 46.3 % (ref 39.0–52.0)
Hemoglobin: 16.2 g/dL (ref 13.0–17.0)
MCH: 27.2 pg (ref 26.0–34.0)
MCHC: 35 g/dL (ref 30.0–36.0)
MCV: 77.8 fL — AB (ref 78.0–100.0)
Platelets: 366 10*3/uL (ref 150–400)
RBC: 5.95 MIL/uL — AB (ref 4.22–5.81)
RDW: 13.4 % (ref 11.5–15.5)
WBC: 13.9 10*3/uL — ABNORMAL HIGH (ref 4.0–10.5)

## 2017-07-30 LAB — I-STAT TROPONIN, ED: Troponin i, poc: 0.01 ng/mL (ref 0.00–0.08)

## 2017-07-30 LAB — URINALYSIS, ROUTINE W REFLEX MICROSCOPIC
BACTERIA UA: NONE SEEN
BILIRUBIN URINE: NEGATIVE
HGB URINE DIPSTICK: NEGATIVE
Ketones, ur: 80 mg/dL — AB
LEUKOCYTES UA: NEGATIVE
NITRITE: NEGATIVE
Protein, ur: 30 mg/dL — AB
Specific Gravity, Urine: 1.024 (ref 1.005–1.030)
Squamous Epithelial / LPF: NONE SEEN
WBC, UA: NONE SEEN WBC/hpf (ref 0–5)
pH: 5 (ref 5.0–8.0)

## 2017-07-30 LAB — HEPATIC FUNCTION PANEL
ALBUMIN: 4.6 g/dL (ref 3.5–5.0)
ALK PHOS: 87 U/L (ref 38–126)
ALT: 10 U/L — ABNORMAL LOW (ref 17–63)
AST: 13 U/L — ABNORMAL LOW (ref 15–41)
Bilirubin, Direct: <0.1 mg/dL — ABNORMAL LOW (ref 0.1–0.5)
TOTAL PROTEIN: 8.6 g/dL — AB (ref 6.5–8.1)
Total Bilirubin: 1.4 mg/dL — ABNORMAL HIGH (ref 0.3–1.2)

## 2017-07-30 LAB — RAPID URINE DRUG SCREEN, HOSP PERFORMED
AMPHETAMINES: NOT DETECTED
BARBITURATES: NOT DETECTED
Benzodiazepines: NOT DETECTED
Cocaine: NOT DETECTED
OPIATES: NOT DETECTED
Tetrahydrocannabinol: NOT DETECTED

## 2017-07-30 LAB — BASIC METABOLIC PANEL
ANION GAP: 25 — AB (ref 5–15)
BUN: 11 mg/dL (ref 6–20)
CALCIUM: 9.6 mg/dL (ref 8.9–10.3)
CHLORIDE: 96 mmol/L — AB (ref 101–111)
CO2: 11 mmol/L — AB (ref 22–32)
Creatinine, Ser: 1.85 mg/dL — ABNORMAL HIGH (ref 0.61–1.24)
GFR calc non Af Amer: 44 mL/min — ABNORMAL LOW (ref >60)
GFR, EST AFRICAN AMERICAN: 51 mL/min — AB (ref >60)
GLUCOSE: 442 mg/dL — AB (ref 65–99)
POTASSIUM: 3.9 mmol/L (ref 3.5–5.1)
Sodium: 132 mmol/L — ABNORMAL LOW (ref 135–145)

## 2017-07-30 LAB — LIPASE, BLOOD: LIPASE: 26 U/L (ref 11–51)

## 2017-07-30 LAB — ETHANOL

## 2017-07-30 LAB — I-STAT CG4 LACTIC ACID, ED: Lactic Acid, Venous: 2.52 mmol/L (ref 0.5–1.9)

## 2017-07-30 MED ORDER — LORAZEPAM 1 MG PO TABS: 0.0000 mg | ORAL_TABLET | Freq: Four times a day (QID) | ORAL | Status: DC

## 2017-07-30 MED ORDER — SODIUM CHLORIDE 0.9 % IV BOLUS (SEPSIS)
1000.0000 mL | Freq: Once | INTRAVENOUS | Status: AC
  Administered 2017-07-30: 1000 mL via INTRAVENOUS

## 2017-07-30 MED ORDER — THIAMINE HCL 100 MG/ML IJ SOLN
100.0000 mg | Freq: Every day | INTRAMUSCULAR | Status: DC
  Administered 2017-07-31: 100 mg via INTRAVENOUS
  Filled 2017-07-30: qty 2

## 2017-07-30 MED ORDER — DEXTROSE-NACL 5-0.45 % IV SOLN: INTRAVENOUS | Status: DC

## 2017-07-30 MED ORDER — VITAMIN B-1 100 MG PO TABS
100.0000 mg | ORAL_TABLET | Freq: Every day | ORAL | Status: DC
  Administered 2017-08-01 – 2017-08-04 (×4): 100 mg via ORAL
  Filled 2017-07-30 (×4): qty 1

## 2017-07-30 MED ORDER — LORAZEPAM 2 MG/ML IJ SOLN
0.0000 mg | Freq: Four times a day (QID) | INTRAMUSCULAR | Status: DC
  Administered 2017-07-30: 2 mg via INTRAVENOUS
  Administered 2017-07-31: 1 mg via INTRAVENOUS
  Filled 2017-07-30 (×3): qty 1

## 2017-07-30 MED ORDER — IOPAMIDOL (ISOVUE-370) INJECTION 76%
INTRAVENOUS | Status: AC
  Administered 2017-07-30: 80 mL
  Filled 2017-07-30: qty 100

## 2017-07-30 MED ORDER — SODIUM CHLORIDE 0.9 % IV SOLN
INTRAVENOUS | Status: AC
  Administered 2017-07-31: 3 [IU]/h via INTRAVENOUS
  Filled 2017-07-30: qty 1

## 2017-07-30 MED ORDER — LORAZEPAM 2 MG/ML IJ SOLN: 0.0000 mg | Freq: Two times a day (BID) | INTRAMUSCULAR | Status: AC

## 2017-07-30 MED ORDER — LORAZEPAM 1 MG PO TABS: 0.0000 mg | ORAL_TABLET | Freq: Two times a day (BID) | ORAL | Status: DC

## 2017-07-30 NOTE — ED Notes (Signed)
Patient transported to CT 

## 2017-07-30 NOTE — ED Provider Notes (Signed)
[facility] EMERGENCY DEPARTMENT Provider Note   CSN: [id number] Arrival date & time: 07/30/17  2123     History   Chief Complaint Chief Complaint  Patient presents with  . Chest Pain    HPI Jacob Schneider is a 42 y.o. male with a hx of AKI, EtOH abuse with h/o withdrawal delirium, IDDM presents to the Emergency Department complaining of gradual, persistent, progressively worsening left sided CP onset 2 days ago.  Pt reports his pain worsened today and he now has associated "numbness" in his arms and legs. He states the pain is constant and rated at a 10/10.  Associated symptoms include nausea, abdominal pain.  Movement and palpation makes it better and nothing makes it worse.  Pt denies fever, chills, headache, neck pain, hematemesis, melena or hematochezia.   Per EMS, pt was given ASA enroute.  EMS also reports dry heaving without emesis.  Pt reports seizures if he stops drinking with his last seizure 1 month ago.  Pt reports no insulin for 2 mos and he has not checked his sugar in the last 4 weeks.  Pt is a very poor historian.    The history is provided by the patient and medical records. The history is limited by a language barrier. A language interpreter was used.    Past Medical History:  Diagnosis Date  . Diabetes mellitus without complication [facility])     Patient Active Problem List   Diagnosis Date Noted  . Alcohol withdrawal syndrome, with delirium ([place]) 07/01/2017  . DKA (diabetic ketoacidoses) ([place]) 07/01/2017  . AKI (acute kidney injury) ([place]) 07/01/2017  . Alcoholic gastritis   . Costochondritis 04/06/2015  . DM (diabetes mellitus) type 2, uncontrolled 04/01/2015  . Hypokalemia 04/01/2015  . Hypomagnesemia 04/01/2015  . Alcohol withdrawal delirium, acute, hyperactive ([place]) 03/30/2015    Past Surgical History:  Procedure Laterality Date  . BACK SURGERY  2004       Home Medications    Prior to Admission medications   Medication Sig  Start Date End Date Taking? Authorizing Provider  blood glucose meter kit and supplies KIT Dispense based on patient and insurance preference. Use up to four times daily as directed. (FOR ICD-9 250.00, 250.01). 07/06/17   Rai, Ripudeep K, MD  cyclobenzaprine (FLEXERIL) 5 MG tablet Take 1 tablet (5 mg total) by mouth 3 (three) times daily as needed for muscle spasms. Patient not taking: Reported on 07/31/2017 07/06/17   Rai, Vernelle Emerald, MD  hydrochlorothiazide (HYDRODIURIL) 25 MG tablet Take 1 tablet (25 mg total) by mouth daily. Patient not taking: Reported on 07/31/2017 07/06/17   Rai, Vernelle Emerald, MD  insulin NPH-regular Human (NOVOLIN 70/30) (70-30) 100 UNIT/ML injection Inject 10 Units into the skin 2 (two) times daily with a meal. Patient not taking: Reported on 07/31/2017 07/06/17   Rai, Vernelle Emerald, MD  Insulin Syringes, Disposable, U-100 0.5 ML MISC Take insulin as directed 07/06/17   Rai, Ripudeep K, MD  lisinopril (PRINIVIL,ZESTRIL) 2.5 MG tablet Take 1 tablet (2.5 mg total) by mouth daily. Patient not taking: Reported on 07/31/2017 07/06/17   Rai, Vernelle Emerald, MD  tamsulosin (FLOMAX) 0.4 MG CAPS capsule Take 1 capsule (0.4 mg total) by mouth daily. Patient not taking: Reported on 07/31/2017 07/07/17   Rai, Vernelle Emerald, MD  traMADol (ULTRAM) 50 MG tablet Take 1 tablet (50 mg total) by mouth every 8 (eight) hours as needed for moderate pain. Patient not taking: Reported on 07/30/2017 07/06/17   Rai, Ripudeep  K, MD    Family History Family History  Problem Relation Age of Onset  . Diabetes Father     Social History Social History   Tobacco Use  . Smoking status: Light Tobacco Smoker  . Smokeless tobacco: Never Used  Substance Use Topics  . Alcohol use: Yes  . Drug use: No     Allergies   Patient has no known allergies.   Review of Systems Review of Systems  Constitutional: Positive for fatigue. Negative for appetite change, diaphoresis, fever and unexpected weight change.  HENT: Negative for  mouth sores.   Eyes: Negative for visual disturbance.  Respiratory: Positive for shortness of breath. Negative for cough, chest tightness and wheezing.   Cardiovascular: Positive for chest pain.  Gastrointestinal: Positive for abdominal pain, nausea and vomiting. Negative for constipation and diarrhea.  Endocrine: Positive for polydipsia and polyuria. Negative for polyphagia.  Genitourinary: Negative for dysuria, frequency, hematuria and urgency.  Musculoskeletal: Negative for back pain and neck stiffness.  Skin: Negative for rash.  Allergic/Immunologic: Negative for immunocompromised state.  Neurological: Positive for weakness (all extremities) and numbness. Negative for syncope, light-headedness and headaches.  Hematological: Does not bruise/bleed easily.  Psychiatric/Behavioral: Negative for sleep disturbance. The patient is not nervous/anxious.      Physical Exam Updated Vital Signs BP (!) 146/98 (BP Location: Left Arm)   Pulse (!) 135   Temp 98.3 F (36.8 C) (Oral)   Resp (!) 22   SpO2 100%   Physical Exam  Constitutional: He appears well-developed and well-nourished. No distress.  Awake, alert, ill appearing  HENT:  Head: Normocephalic and atraumatic.  Mouth/Throat: Oropharynx is clear and moist. No oropharyngeal exudate.  Eyes: Conjunctivae are normal. No scleral icterus.  Neck: Normal range of motion. Neck supple.  Cardiovascular: Regular rhythm and intact distal pulses. Tachycardia present.  Pulses:      Radial pulses are 2+ on the right side, and 2+ on the left side.       Dorsalis pedis pulses are 2+ on the right side, and 2+ on the left side.  Pulmonary/Chest: Effort normal and breath sounds normal. Tachypnea noted. No respiratory distress. He has no wheezes.  Equal chest expansion  Abdominal: Soft. Bowel sounds are normal. He exhibits no mass. There is generalized tenderness. There is no rigidity, no rebound, no guarding, no tenderness at McBurney's point and  negative Murphy's sign.  Musculoskeletal: Normal range of motion. He exhibits no edema.  Pt reports he is unable to move either lower extremity.  Unable to test strength as he is unable/unwilling to move his BLE. Sensation in BLE intact to sharp touch with a needle and does withdraw from pain in the BLE.    Neurological: He is alert.  Speech is clear and goal oriented 4/5 strength in the BUE. Sensation intact to normal and sharp touch in the BUE  Skin: Skin is warm and dry. He is not diaphoretic.  Psychiatric: He has a normal mood and affect. He is not actively hallucinating.  Nursing note and vitals reviewed.    ED Treatments / Results  Labs (all labs ordered are listed, but only abnormal results are displayed) Labs Reviewed  BASIC METABOLIC PANEL - Abnormal; Notable for the following components:      Result Value   Sodium 132 (*)    Chloride 96 (*)    CO2 11 (*)    Glucose, Bld 442 (*)    Creatinine, Ser 1.85 (*)    GFR calc non Af Wyvonnia Lora  44 (*)    GFR calc Af Amer 51 (*)    Anion gap 25 (*)    All other components within normal limits  CBC - Abnormal; Notable for the following components:   WBC 13.9 (*)    RBC 5.95 (*)    MCV 77.8 (*)    All other components within normal limits  URINALYSIS, ROUTINE W REFLEX MICROSCOPIC - Abnormal; Notable for the following components:   Color, Urine STRAW (*)    Glucose, UA >=500 (*)    Ketones, ur 80 (*)    Protein, ur 30 (*)    All other components within normal limits  HEPATIC FUNCTION PANEL - Abnormal; Notable for the following components:   Total Protein 8.6 (*)    AST 13 (*)    ALT 10 (*)    Total Bilirubin 1.4 (*)    Bilirubin, Direct <0.1 (*)    All other components within normal limits  I-STAT CG4 LACTIC ACID, ED - Abnormal; Notable for the following components:   Lactic Acid, Venous 2.52 (*)    All other components within normal limits  CBG MONITORING, ED - Abnormal; Notable for the following components:    Glucose-Capillary 357 (*)    All other components within normal limits  CBG MONITORING, ED - Abnormal; Notable for the following components:   Glucose-Capillary 247 (*)    All other components within normal limits  RAPID URINE DRUG SCREEN, HOSP PERFORMED  ETHANOL  LIPASE, BLOOD  AMMONIA  BASIC METABOLIC PANEL  BASIC METABOLIC PANEL  BASIC METABOLIC PANEL  BASIC METABOLIC PANEL  MAGNESIUM  I-STAT TROPONIN, ED  I-STAT TROPONIN, ED  I-STAT TROPONIN, ED    EKG  EKG Interpretation  Date/Time:  Sunday July 30 2017 21:30:26 EST Ventricular Rate:  134 PR Interval:  124 QRS Duration: 76 QT Interval:  314 QTC Calculation: 468 R Axis:   27 Text Interpretation:  Sinus tachycardia Cannot rule out Inferior infarct , age undetermined Abnormal ECG Normal sinus rhythm Confirmed by Zenovia Jarred [phone]) on 07/30/2017 10:08:39 PM       Radiology Dg Chest 2 View  Result Date: 07/30/2017 CLINICAL DATA:  Severe chest pain, fever, and cough for 1 week. EXAM: CHEST  2 VIEW COMPARISON:  03/31/2015 FINDINGS: The heart size and mediastinal contours are within normal limits. Both lungs are clear. The visualized skeletal structures are unremarkable. IMPRESSION: No active cardiopulmonary disease. Electronically Signed   By: Earle Gell M.D.   On: 07/30/2017 22:42   Ct Angio Chest/abd/pel For Dissection W And/or Wo Contrast  Result Date: 07/31/2017 CLINICAL DATA:  Left-sided chest pain starting today. EXAM: CT ANGIOGRAPHY CHEST, ABDOMEN AND PELVIS TECHNIQUE: Multidetector CT imaging through the chest, abdomen and pelvis was performed using the standard protocol during bolus administration of intravenous contrast. Multiplanar reconstructed images and MIPs were obtained and reviewed to evaluate the vascular anatomy. CONTRAST:  13m ISOVUE-370 IOPAMIDOL (ISOVUE-370) INJECTION 76% COMPARISON:  CT angiogram dated 07/01/2017. FINDINGS: CTA CHEST FINDINGS Cardiovascular: No thoracic aortic aneurysm or  dissection. Heart size is normal. No pericardial effusion. No pulmonary embolism identified within the main, lobar or segmental pulmonary arteries bilaterally. Mediastinum/Nodes: No mass or enlarged lymph nodes within the mediastinum or perihilar regions. Esophagus appears normal. Trachea and central bronchi are unremarkable. Lungs/Pleura: Lungs are clear.  No pleural effusion or pneumothorax. Musculoskeletal: No chest wall abnormality. No acute or significant osseous findings. Review of the MIP images confirms the above findings. CTA ABDOMEN AND PELVIS FINDINGS VASCULAR Aorta: Normal  caliber aorta without aneurysm, dissection, vasculitis or significant stenosis. Celiac: Patent without evidence of aneurysm, dissection, vasculitis or significant stenosis. SMA: Patent without evidence of aneurysm, dissection, vasculitis or significant stenosis. Renals: Both renal arteries are patent without evidence of aneurysm, dissection, vasculitis, fibromuscular dysplasia or significant stenosis. IMA: Patent without evidence of aneurysm, dissection, vasculitis or significant stenosis. Inflow: Patent without evidence of aneurysm, dissection, vasculitis or significant stenosis. Veins: No obvious venous abnormality within the limitations of this arterial phase study. Review of the MIP images confirms the above findings. NON-VASCULAR Hepatobiliary: No focal liver abnormality is seen. No gallstones, gallbladder wall thickening, or biliary dilatation. Pancreas: Unremarkable. No pancreatic ductal dilatation or surrounding inflammatory changes. Spleen: Normal in size without focal abnormality. Adrenals/Urinary Tract: Adrenal glands appear normal. Kidneys appear normal without mass, stone or hydronephrosis. No ureteral or bladder calculi identified. Stomach/Bowel: Bowel is normal in caliber. No bowel wall thickening or evidence of bowel wall inflammation seen. Appendix is normal. Lymphatic: No significant vascular findings are present. No  enlarged abdominal or pelvic lymph nodes. Reproductive: Prostate is unremarkable. Other: No free fluid or abscess collection. No free intraperitoneal air. Musculoskeletal: No acute or significant osseous findings. Review of the MIP images confirms the above findings. IMPRESSION: Normal CT angiogram of the chest, abdomen and pelvis. No aortic aneurysm or dissection. Electronically Signed   By: Franki Cabot M.D.   On: 07/31/2017 00:12    Procedures .Critical Care Performed by: Abigail Butts, PA-C Authorized by: Abigail Butts, PA-C   Critical care provider statement:    Critical care time (minutes):  45   Critical care time was exclusive of:  Separately billable procedures and treating other patients and teaching time   Critical care was necessary to treat or prevent imminent or life-threatening deterioration of the following conditions:  Endocrine crisis and metabolic crisis   Critical care was time spent personally by me on the following activities:  Development of treatment plan with patient or surrogate, discussions with consultants, evaluation of patient's response to treatment, examination of patient, obtaining history from patient or surrogate, ordering and performing treatments and interventions, ordering and review of laboratory studies, ordering and review of radiographic studies, pulse oximetry, re-evaluation of patient's condition and review of old charts   I assumed direction of critical care for this patient from another provider in my specialty: no      (including critical care time)  Medications Ordered in ED Medications  LORazepam (ATIVAN) injection 0-4 mg (2 mg Intravenous Given 07/30/17 2309)    Or  LORazepam (ATIVAN) tablet 0-4 mg ( Oral See Alternative 07/30/17 2309)  LORazepam (ATIVAN) injection 0-4 mg (not administered)    Or  LORazepam (ATIVAN) tablet 0-4 mg (not administered)  thiamine (VITAMIN B-1) tablet 100 mg (not administered)    Or  thiamine (B-1)  injection 100 mg (not administered)  insulin regular (NOVOLIN R,HUMULIN R) 100 Units in sodium chloride 0.9 % 100 mL (1 Units/mL) infusion (1.9 Units/hr Intravenous Rate/Dose Change 07/31/17 0208)  0.9 %  sodium chloride infusion ( Intravenous New Bag/Given 07/31/17 0225)  dextrose 5 %-0.45 % sodium chloride infusion (not administered)  enoxaparin (LOVENOX) injection 40 mg (not administered)  potassium chloride 10 mEq in 100 mL IVPB (not administered)  LORazepam (ATIVAN) injection 2 mg (not administered)  sodium chloride 0.9 % bolus 1,000 mL (0 mLs Intravenous Stopped 07/31/17 0104)  iopamidol (ISOVUE-370) 76 % injection (80 mLs  Contrast Given 07/30/17 2334)  sodium chloride 0.9 % bolus 1,000 mL (0 mLs Intravenous  Stopped 07/31/17 0209)  LORazepam (ATIVAN) injection 2 mg (2 mg Intravenous Given 07/31/17 0134)     Initial Impression / Assessment and Plan / ED Course  I have reviewed the triage vital signs and the nursing notes.  Pertinent labs & imaging results that were available during my care of the patient were reviewed by me and considered in my medical decision making (see chart for details).  Clinical Course as of Jul 31 237  Sun Jul 30, 2017  2244 No PCP  [HM]  Mon Jul 31, 2017  0030 CIWA 20.  Pt given 55m of ativan.  Pt denies hallucinations  [HM]  0201 Discussed with Dr. GAlcario Droughtwho will admit.   [HM]  0202 Pt remains agitated and tachycardic.  Additional 235mAtivan given.  [HM]  025374atient is much more alert at this time.  He remains tachycardic.  We will continue to give fluids, insulin.  Will give additional Ativan.  He is answering questions appropriately.  [HM]    Clinical Course User Index [HM] Willis Kuipers, HaJarrett SohoPA-C     Patient presents with numerous complaints and is a poor historian.  Difficult history taking.  He complains of 2 days of chest pain, now with numbness and weakness in his bilateral upper and lower extremities.  Concern for dissection.  CT scan without  evidence of pulmonary embolism, aortic dissection, AAA or acute abdominal pathology.  I personally reviewed the images.  Chest x-ray without evidence of pneumothorax or pneumonia.  Patient is afebrile.    Patient without insulin for several weeks.  Is an elevated lactic acid, elevated glucose at 442 large anion gap of 25 and ketones in his urine.  Evidence of DKA.  He has a nonspecific leukocytosis.  He is not anemic.  Patient given fluids and glucose stabilizer initiated.  Patient also with acute kidney injury with creatinine of 1.85 up from baseline.    Patient is an alcoholic.  No alcohol today and only 2 beers yesterday.  He is agitated and tachycardic.  This is not improved with fluids.  Suspect alcohol withdrawal. CIWA 20 and patient given Ativan.  Patient denies seizures no seizure activity here in the emergency department.  Numerous reassessments.  Patient's mental status has improved significantly and he is less lethargic.  He is now able to move his legs and answers questions more readily.  He remains tachycardic.  Additional Ativan given.  Discussed with [facility] hospitalist who will admit for further evaluation and treatment.  Final Clinical Impressions(s) / ED Diagnoses   Final diagnoses:  Central chest pain  Diabetic ketoacidosis without coma associated with type 2 diabetes mellitus (HCC)  Tachycardia  ETOH abuse  [facility]   ED Discharge Orders    None       MuAgapito Games1/28/19 0239    MaMacarthur CritchleyMD 08/01/17 1447

## 2017-07-30 NOTE — ED Triage Notes (Addendum)
Per [facility], pt speaks spanish and will need interpreter. Pt reports left sided CP rated 10/10 that started yesterday. Pt was given 324 of ASA in truck, but swallowed instead of chewed. EMS reports dry heaving in truck, no vomiting witnessed. Per Corlis LeakMackuen, MD pt reported bilateral leg pain. This RN attempted interpreter use which worked for 5 minutes and then pt stopped answering pt and is moaning/grimacing. Pt attempted to calm pt and instructed him to take slow deep breaths. Mackuen, MD able to obtain hx of DM and NKA.

## 2017-07-30 NOTE — ED Notes (Signed)
ED Provider at bedside. 

## 2017-07-30 NOTE — ED Notes (Signed)
Mackuen, MD at bedside 

## 2017-07-30 NOTE — ED Notes (Signed)
Patient transported to X-ray 

## 2017-07-30 NOTE — ED Notes (Signed)
Pt now complaining of bilateral feet numbness, EDP at bedside and aware.

## 2017-07-30 NOTE — ED Notes (Signed)
Pt reports CP woke him up from his sleep yesterday morning. Pt reports bilateral foot pain x1 hour, but does not hurt when laying down. Pt reports N/V, denies SOB. Hx: HTN and DM

## 2017-07-31 ENCOUNTER — Other Ambulatory Visit: Payer: Self-pay

## 2017-07-31 ENCOUNTER — Encounter (HOSPITAL_COMMUNITY): Payer: Self-pay | Admitting: General Practice

## 2017-07-31 DIAGNOSIS — F10231 Alcohol dependence with withdrawal delirium: Secondary | ICD-10-CM

## 2017-07-31 DIAGNOSIS — N179 Acute kidney failure, unspecified: Secondary | ICD-10-CM

## 2017-07-31 DIAGNOSIS — E1310 Other specified diabetes mellitus with ketoacidosis without coma: Secondary | ICD-10-CM

## 2017-07-31 DIAGNOSIS — F10239 Alcohol dependence with withdrawal, unspecified: Secondary | ICD-10-CM

## 2017-07-31 DIAGNOSIS — F1010 Alcohol abuse, uncomplicated: Secondary | ICD-10-CM

## 2017-07-31 DIAGNOSIS — E1110 Type 2 diabetes mellitus with ketoacidosis without coma: Principal | ICD-10-CM

## 2017-07-31 LAB — CBG MONITORING, ED
GLUCOSE-CAPILLARY: 145 mg/dL — AB (ref 65–99)
GLUCOSE-CAPILLARY: 148 mg/dL — AB (ref 65–99)
GLUCOSE-CAPILLARY: 357 mg/dL — AB (ref 65–99)
Glucose-Capillary: 247 mg/dL — ABNORMAL HIGH (ref 65–99)
Glucose-Capillary: 209 mg/dL — ABNORMAL HIGH (ref 65–99)
Glucose-Capillary: 180 mg/dL — ABNORMAL HIGH (ref 65–99)
Glucose-Capillary: 163 mg/dL — ABNORMAL HIGH (ref 65–99)

## 2017-07-31 LAB — BASIC METABOLIC PANEL
Anion gap: 16 — ABNORMAL HIGH (ref 5–15)
Anion gap: 12 (ref 5–15)
Anion gap: 15 (ref 5–15)
Anion gap: 9 (ref 5–15)
BUN: 12 mg/dL (ref 6–20)
BUN: 12 mg/dL (ref 6–20)
BUN: 12 mg/dL (ref 6–20)
BUN: 10 mg/dL (ref 6–20)
CALCIUM: 8.3 mg/dL — AB (ref 8.9–10.3)
CHLORIDE: 108 mmol/L (ref 101–111)
CHLORIDE: 109 mmol/L (ref 101–111)
CHLORIDE: 105 mmol/L (ref 101–111)
CO2: 14 mmol/L — ABNORMAL LOW (ref 22–32)
CO2: 16 mmol/L — ABNORMAL LOW (ref 22–32)
CO2: 16 mmol/L — ABNORMAL LOW (ref 22–32)
CO2: 18 mmol/L — ABNORMAL LOW (ref 22–32)
CREATININE: 1.42 mg/dL — AB (ref 0.61–1.24)
CREATININE: 1.25 mg/dL — AB (ref 0.61–1.24)
CREATININE: 1.15 mg/dL (ref 0.61–1.24)
CREATININE: 0.96 mg/dL (ref 0.61–1.24)
Calcium: 8.5 mg/dL — ABNORMAL LOW (ref 8.9–10.3)
Calcium: 8.5 mg/dL — ABNORMAL LOW (ref 8.9–10.3)
Calcium: 8.5 mg/dL — ABNORMAL LOW (ref 8.9–10.3)
Chloride: 109 mmol/L (ref 101–111)
GFR calc Af Amer: >60 mL/min (ref >60)
GFR calc Af Amer: >60 mL/min (ref >60)
GFR calc Af Amer: >60 mL/min (ref >60)
GFR calc non Af Amer: >60 mL/min (ref >60)
GFR calc non Af Amer: >60 mL/min (ref >60)
GFR calc non Af Amer: >60 mL/min (ref >60)
GLUCOSE: 230 mg/dL — AB (ref 65–99)
GLUCOSE: 145 mg/dL — AB (ref 65–99)
GLUCOSE: 165 mg/dL — AB (ref 65–99)
Glucose, Bld: 168 mg/dL — ABNORMAL HIGH (ref 65–99)
POTASSIUM: 3.7 mmol/L (ref 3.5–5.1)
POTASSIUM: 4 mmol/L (ref 3.5–5.1)
POTASSIUM: 3.5 mmol/L (ref 3.5–5.1)
Potassium: 3 mmol/L — ABNORMAL LOW (ref 3.5–5.1)
SODIUM: 136 mmol/L (ref 135–145)
Sodium: 138 mmol/L (ref 135–145)
Sodium: 137 mmol/L (ref 135–145)
Sodium: 136 mmol/L (ref 135–145)

## 2017-07-31 LAB — I-STAT TROPONIN, ED: Troponin i, poc: 0.05 ng/mL (ref 0.00–0.08)

## 2017-07-31 LAB — GLUCOSE, CAPILLARY
GLUCOSE-CAPILLARY: 184 mg/dL — AB (ref 65–99)
GLUCOSE-CAPILLARY: 150 mg/dL — AB (ref 65–99)
GLUCOSE-CAPILLARY: 152 mg/dL — AB (ref 65–99)
GLUCOSE-CAPILLARY: 149 mg/dL — AB (ref 65–99)
GLUCOSE-CAPILLARY: 221 mg/dL — AB (ref 65–99)
Glucose-Capillary: 143 mg/dL — ABNORMAL HIGH (ref 65–99)
Glucose-Capillary: 144 mg/dL — ABNORMAL HIGH (ref 65–99)
Glucose-Capillary: 158 mg/dL — ABNORMAL HIGH (ref 65–99)
Glucose-Capillary: 151 mg/dL — ABNORMAL HIGH (ref 65–99)
Glucose-Capillary: 159 mg/dL — ABNORMAL HIGH (ref 65–99)
Glucose-Capillary: 171 mg/dL — ABNORMAL HIGH (ref 65–99)
Glucose-Capillary: 260 mg/dL — ABNORMAL HIGH (ref 65–99)

## 2017-07-31 LAB — MAGNESIUM: Magnesium: 1.8 mg/dL (ref 1.7–2.4)

## 2017-07-31 LAB — AMMONIA: Ammonia: 16 umol/L (ref 9–35)

## 2017-07-31 MED ORDER — POTASSIUM CHLORIDE 10 MEQ/100ML IV SOLN
10.0000 meq | INTRAVENOUS | Status: DC
  Administered 2017-07-31: 10 meq via INTRAVENOUS
  Filled 2017-07-31: qty 100

## 2017-07-31 MED ORDER — ACETAMINOPHEN 325 MG PO TABS
650.0000 mg | ORAL_TABLET | Freq: Four times a day (QID) | ORAL | Status: DC | PRN
  Administered 2017-07-31 – 2017-08-01 (×2): 650 mg via ORAL
  Filled 2017-07-31 (×2): qty 2

## 2017-07-31 MED ORDER — INSULIN ASPART 100 UNIT/ML ~~LOC~~ SOLN
6.0000 [IU] | Freq: Once | SUBCUTANEOUS | Status: AC
  Administered 2017-07-31: 6 [IU] via SUBCUTANEOUS

## 2017-07-31 MED ORDER — POTASSIUM CHLORIDE CRYS ER 20 MEQ PO TBCR
40.0000 meq | EXTENDED_RELEASE_TABLET | Freq: Once | ORAL | Status: AC
  Administered 2017-07-31: 40 meq via ORAL
  Filled 2017-07-31: qty 2

## 2017-07-31 MED ORDER — SODIUM CHLORIDE 0.9 % IV BOLUS (SEPSIS)
1000.0000 mL | Freq: Once | INTRAVENOUS | Status: AC
  Administered 2017-07-31: 1000 mL via INTRAVENOUS

## 2017-07-31 MED ORDER — ENSURE ENLIVE PO LIQD
237.0000 mL | Freq: Two times a day (BID) | ORAL | Status: DC
  Administered 2017-08-01 – 2017-08-03 (×5): 237 mL via ORAL

## 2017-07-31 MED ORDER — BUTALBITAL-APAP-CAFFEINE 50-325-40 MG PO TABS
1.0000 | ORAL_TABLET | Freq: Four times a day (QID) | ORAL | Status: DC | PRN
  Administered 2017-07-31: 1 via ORAL
  Administered 2017-07-31: 2 via ORAL
  Filled 2017-07-31: qty 1
  Filled 2017-07-31: qty 2

## 2017-07-31 MED ORDER — DEXTROSE-NACL 5-0.45 % IV SOLN
INTRAVENOUS | Status: AC
  Administered 2017-07-31: 03:00:00 via INTRAVENOUS

## 2017-07-31 MED ORDER — LORAZEPAM 2 MG/ML IJ SOLN
2.0000 mg | Freq: Once | INTRAMUSCULAR | Status: AC
  Administered 2017-07-31: 2 mg via INTRAVENOUS
  Filled 2017-07-31: qty 1

## 2017-07-31 MED ORDER — LORAZEPAM 2 MG/ML IJ SOLN
2.0000 mg | Freq: Once | INTRAMUSCULAR | Status: AC
Start: 2017-07-31 — End: 2017-07-31
  Administered 2017-07-31: 2 mg via INTRAVENOUS
  Filled 2017-07-31: qty 1

## 2017-07-31 MED ORDER — SODIUM CHLORIDE 0.9 % IV SOLN
INTRAVENOUS | Status: DC
  Administered 2017-07-31 (×2): via INTRAVENOUS

## 2017-07-31 MED ORDER — INSULIN ASPART 100 UNIT/ML ~~LOC~~ SOLN
0.0000 [IU] | Freq: Three times a day (TID) | SUBCUTANEOUS | Status: DC
  Administered 2017-08-01: 2 [IU] via SUBCUTANEOUS
  Administered 2017-08-01: 7 [IU] via SUBCUTANEOUS

## 2017-07-31 MED ORDER — SODIUM CHLORIDE 0.9 % IV SOLN
INTRAVENOUS | Status: DC
  Administered 2017-07-31 – 2017-08-02 (×2): via INTRAVENOUS

## 2017-07-31 MED ORDER — INSULIN ASPART 100 UNIT/ML ~~LOC~~ SOLN
0.0000 [IU] | Freq: Every day | SUBCUTANEOUS | Status: DC
  Administered 2017-07-31: 3 [IU] via SUBCUTANEOUS

## 2017-07-31 MED ORDER — INSULIN GLARGINE 100 UNIT/ML ~~LOC~~ SOLN
10.0000 [IU] | Freq: Every day | SUBCUTANEOUS | Status: DC
  Administered 2017-07-31: 10 [IU] via SUBCUTANEOUS
  Filled 2017-07-31: qty 0.1

## 2017-07-31 MED ORDER — ENOXAPARIN SODIUM 40 MG/0.4ML ~~LOC~~ SOLN
40.0000 mg | SUBCUTANEOUS | Status: DC
  Administered 2017-07-31 – 2017-08-04 (×5): 40 mg via SUBCUTANEOUS
  Filled 2017-07-31 (×4): qty 0.4

## 2017-07-31 NOTE — Progress Notes (Signed)
On call night provider notified regarding Insulin gtt, current insulin orders, and current CBG of 260. Orders received for total of 9Units Novalog (SQ)  to be given at this as insulin gtt is being turned off. Will administer and continue CBG as AC&HS.

## 2017-07-31 NOTE — Progress Notes (Signed)
Pt. Co of headache, MD made aware and Tylenol ordered. Headache unrelieved by Tylenol MD ordered Fioricet. Ice pack also given.

## 2017-07-31 NOTE — H&P (Signed)
History and Physical    Jacob Schneider TFT:[id number] DOB: [birth year] DOA: 07/30/2017  PCP: Patient, No Pcp Per  Patient coming from: Home  I have personally briefly reviewed patient's old medical records in [place]  Chief Complaint: CP  HPI: Jacob Schneider is a 42 y.o. male with medical history significant of IDDM, EtOH abuse with h/o withdrawal delirium.  Patient presents to the ED with c/o persistent, gradually worsening, L sided, CP.  Onset 1 week ago.  Constant, 10/10.  Associated nausea, abd pain, dry heaves.  Nothing makes better or worse.  Admits to not taking his insulin every day.  Last checked sugar at home about 4 weeks ago.  ED Course: Patient in DKA with AG 25, bicarb 11.  Also EtOH level of 0.  CIWA and DKA treatment started.   Review of Systems: As per HPI otherwise 10 point review of systems negative.   Past Medical History:  Diagnosis Date  . Diabetes mellitus without complication [facility])     Past Surgical History:  Procedure Laterality Date  . BACK SURGERY  2004     reports that he has been smoking.  he has never used smokeless tobacco. He reports that he drinks alcohol. He reports that he does not use drugs.  No Known Allergies  Family History  Problem Relation Age of Onset  . Diabetes Father      Prior to Admission medications   Medication Sig Start Date End Date Taking? Authorizing Provider  blood glucose meter kit and supplies KIT Dispense based on patient and insurance preference. Use up to four times daily as directed. (FOR ICD-9 250.00, 250.01). 07/06/17   Rai, Ripudeep K, MD  cyclobenzaprine (FLEXERIL) 5 MG tablet Take 1 tablet (5 mg total) by mouth 3 (three) times daily as needed for muscle spasms. Patient not taking: Reported on 07/31/2017 07/06/17   Rai, Vernelle Emerald, MD  hydrochlorothiazide (HYDRODIURIL) 25 MG tablet Take 1 tablet (25 mg total) by mouth daily. Patient not taking: Reported on 07/31/2017 07/06/17   Rai, Vernelle Emerald, MD    insulin NPH-regular Human (NOVOLIN 70/30) (70-30) 100 UNIT/ML injection Inject 10 Units into the skin 2 (two) times daily with a meal. Patient not taking: Reported on 07/31/2017 07/06/17   Rai, Vernelle Emerald, MD  Insulin Syringes, Disposable, U-100 0.5 ML MISC Take insulin as directed 07/06/17   Rai, Ripudeep K, MD  lisinopril (PRINIVIL,ZESTRIL) 2.5 MG tablet Take 1 tablet (2.5 mg total) by mouth daily. Patient not taking: Reported on 07/31/2017 07/06/17   Rai, Vernelle Emerald, MD  tamsulosin (FLOMAX) 0.4 MG CAPS capsule Take 1 capsule (0.4 mg total) by mouth daily. Patient not taking: Reported on 07/31/2017 07/07/17   Rai, Vernelle Emerald, MD  traMADol (ULTRAM) 50 MG tablet Take 1 tablet (50 mg total) by mouth every 8 (eight) hours as needed for moderate pain. Patient not taking: Reported on 07/30/2017 07/06/17   Mendel Corning, MD    Physical Exam: Vitals:   07/31/17 0100 07/31/17 0115 07/31/17 0130 07/31/17 0200  BP: 137/82 132/87 (!) 135/91   Pulse: (!) 136 (!) 133    Resp: (!) 29 19 (!) 27   Temp:      TempSrc:      SpO2: 99% 100%    Weight:    76.5 kg (168 lb 10.4 oz)  Height:    5' 7"  (1.702 m)    Constitutional: NAD, calm, comfortable, can smell ketones Eyes: PERRL, lids and conjunctivae normal ENMT: Mucous  membranes are moist. Posterior pharynx clear of any exudate or lesions.Normal dentition.  Neck: normal, supple, no masses, no thyromegaly Respiratory: clear to auscultation bilaterally, no wheezing, no crackles. Normal respiratory effort. No accessory muscle use.  Cardiovascular: Regular rate and rhythm, no murmurs / rubs / gallops. No extremity edema. 2+ pedal pulses. No carotid bruits.  Abdomen: no tenderness, no masses palpated. No hepatosplenomegaly. Bowel sounds positive.  Musculoskeletal: no clubbing / cyanosis. No joint deformity upper and lower extremities. Good ROM, no contractures. Normal muscle tone.  Skin: no rashes, lesions, ulcers. No induration Neurologic: CN 2-12 grossly intact.  Sensation intact, DTR normal. Strength 5/5 in all 4.  Psychiatric: Normal judgment and insight. Alert and oriented x 3. Normal mood.    Labs on Admission: I have personally reviewed following labs and imaging studies  CBC: Recent Labs  Lab 07/30/17 2214  WBC 13.9*  HGB 16.2  HCT 46.3  MCV 77.8*  PLT 353   Basic Metabolic Panel: Recent Labs  Lab 07/30/17 2214  NA 132*  K 3.9  CL 96*  CO2 11*  GLUCOSE 442*  BUN 11  CREATININE 1.85*  CALCIUM 9.6   GFR: Estimated Creatinine Clearance: 49.1 mL/min (A) (by C-G formula based on SCr of 1.85 mg/dL (H)). Liver Function Tests: Recent Labs  Lab 07/30/17 2214  AST 13*  ALT 10*  ALKPHOS 87  BILITOT 1.4*  PROT 8.6*  ALBUMIN 4.6   Recent Labs  Lab 07/30/17 2214  LIPASE 26   No results for input(s): AMMONIA in the last 168 hours. Coagulation Profile: No results for input(s): INR, PROTIME in the last 168 hours. Cardiac Enzymes: No results for input(s): CKTOTAL, CKMB, CKMBINDEX, TROPONINI in the last 168 hours. BNP (last 3 results) No results for input(s): PROBNP in the last 8760 hours. HbA1C: No results for input(s): HGBA1C in the last 72 hours. CBG: Recent Labs  Lab 07/31/17 0049 07/31/17 0205  GLUCAP 357* 247*   Lipid Profile: No results for input(s): CHOL, HDL, LDLCALC, TRIG, CHOLHDL, LDLDIRECT in the last 72 hours. Thyroid Function Tests: No results for input(s): TSH, T4TOTAL, FREET4, T3FREE, THYROIDAB in the last 72 hours. Anemia Panel: No results for input(s): VITAMINB12, FOLATE, FERRITIN, TIBC, IRON, RETICCTPCT in the last 72 hours. Urine analysis:    Component Value Date/Time   COLORURINE STRAW (A) 07/30/2017 2315   APPEARANCEUR CLEAR 07/30/2017 2315   LABSPEC 1.024 07/30/2017 2315   PHURINE 5.0 07/30/2017 2315   GLUCOSEU >=500 (A) 07/30/2017 2315   HGBUR NEGATIVE 07/30/2017 2315   BILIRUBINUR NEGATIVE 07/30/2017 2315   KETONESUR 80 (A) 07/30/2017 2315   PROTEINUR 30 (A) 07/30/2017 2315    UROBILINOGEN 1.0 03/30/2015 1730   NITRITE NEGATIVE 07/30/2017 2315   LEUKOCYTESUR NEGATIVE 07/30/2017 2315    Radiological Exams on Admission: Dg Chest 2 View  Result Date: 07/30/2017 CLINICAL DATA:  Severe chest pain, fever, and cough for 1 week. EXAM: CHEST  2 VIEW COMPARISON:  03/31/2015 FINDINGS: The heart size and mediastinal contours are within normal limits. Both lungs are clear. The visualized skeletal structures are unremarkable. IMPRESSION: No active cardiopulmonary disease. Electronically Signed   By: Earle Gell M.D.   On: 07/30/2017 22:42   Ct Angio Chest/abd/pel For Dissection W And/or Wo Contrast  Result Date: 07/31/2017 CLINICAL DATA:  Left-sided chest pain starting today. EXAM: CT ANGIOGRAPHY CHEST, ABDOMEN AND PELVIS TECHNIQUE: Multidetector CT imaging through the chest, abdomen and pelvis was performed using the standard protocol during bolus administration of intravenous contrast. Multiplanar reconstructed  images and MIPs were obtained and reviewed to evaluate the vascular anatomy. CONTRAST:  68m ISOVUE-370 IOPAMIDOL (ISOVUE-370) INJECTION 76% COMPARISON:  CT angiogram dated 07/01/2017. FINDINGS: CTA CHEST FINDINGS Cardiovascular: No thoracic aortic aneurysm or dissection. Heart size is normal. No pericardial effusion. No pulmonary embolism identified within the main, lobar or segmental pulmonary arteries bilaterally. Mediastinum/Nodes: No mass or enlarged lymph nodes within the mediastinum or perihilar regions. Esophagus appears normal. Trachea and central bronchi are unremarkable. Lungs/Pleura: Lungs are clear.  No pleural effusion or pneumothorax. Musculoskeletal: No chest wall abnormality. No acute or significant osseous findings. Review of the MIP images confirms the above findings. CTA ABDOMEN AND PELVIS FINDINGS VASCULAR Aorta: Normal caliber aorta without aneurysm, dissection, vasculitis or significant stenosis. Celiac: Patent without evidence of aneurysm, dissection,  vasculitis or significant stenosis. SMA: Patent without evidence of aneurysm, dissection, vasculitis or significant stenosis. Renals: Both renal arteries are patent without evidence of aneurysm, dissection, vasculitis, fibromuscular dysplasia or significant stenosis. IMA: Patent without evidence of aneurysm, dissection, vasculitis or significant stenosis. Inflow: Patent without evidence of aneurysm, dissection, vasculitis or significant stenosis. Veins: No obvious venous abnormality within the limitations of this arterial phase study. Review of the MIP images confirms the above findings. NON-VASCULAR Hepatobiliary: No focal liver abnormality is seen. No gallstones, gallbladder wall thickening, or biliary dilatation. Pancreas: Unremarkable. No pancreatic ductal dilatation or surrounding inflammatory changes. Spleen: Normal in size without focal abnormality. Adrenals/Urinary Tract: Adrenal glands appear normal. Kidneys appear normal without mass, stone or hydronephrosis. No ureteral or bladder calculi identified. Stomach/Bowel: Bowel is normal in caliber. No bowel wall thickening or evidence of bowel wall inflammation seen. Appendix is normal. Lymphatic: No significant vascular findings are present. No enlarged abdominal or pelvic lymph nodes. Reproductive: Prostate is unremarkable. Other: No free fluid or abscess collection. No free intraperitoneal air. Musculoskeletal: No acute or significant osseous findings. Review of the MIP images confirms the above findings. IMPRESSION: Normal CT angiogram of the chest, abdomen and pelvis. No aortic aneurysm or dissection. Electronically Signed   By: SFranki CabotM.D.   On: 07/31/2017 00:12    EKG: Independently reviewed.  Assessment/Plan Principal Problem:   DKA (diabetic ketoacidoses) (HCC) Active Problems:   DM (diabetes mellitus) type 2, uncontrolled   Alcohol withdrawal syndrome, with delirium ([place]   AKI (acute kidney injury) ([place]    1. DKA - 1. DKA  pathway 2. IVF: 2L bolus then 125 cc/hr NS then 75 cc/hr D5half 3. BMP Q4H 4. Insulin gtt 5. Diab coordinator consult in AM 2. EtOH withdrawal - 1. CIWA 3. AKI - 1. Likely pre-renal due to #1 above 2. Trend creat 3. IVF 4. Holding lisinopril, hctz  DVT prophylaxis: Lovenox Code Status: Full Family Communication: No family in room Disposition Plan: Home after admit Consults called: None Admission status: Admit to inpatient - inpatient status for treatment of DKA   GEtta QuillDO [facility] Pager [phone] If 7AM-7PM, please contact day team taking care of patient [email or web address] Password [email or web address]  07/31/2017, 2:09 AM

## 2017-07-31 NOTE — Progress Notes (Addendum)
Pt admitted to the unit at 0830. Pt mental status is Oriented. Pt oriented to room, staff, and call bell. Skin is intact. Full assessment charted in [facility]. Call bell within reach. Visitor guidelines reviewed w/ pt and/or family.

## 2017-07-31 NOTE — ED Notes (Signed)
Diabetes coordinator paged 

## 2017-07-31 NOTE — Progress Notes (Signed)
[name] TEAM 1 - Stepdown/ICU TEAM  Jacob Schneider  WUJ:[id number] DOB: 08-28-1975 DOA: 07/30/2017 PCP: Patient, No Pcp Per    Brief Narrative:  42 y.o. male with a history of DM and EtOH abuse who presented to the ED with L sided chest pain with nausea, abd pain, and dry heaves.   He admitted to not regularly taking his insulin or checking his CBG.    In the ED he was found to be in DKA with AG 25, bicarb 11.    Subjective: Pt is seen for a f/u visit.    Assessment & Plan:  DM - DKA  EtOH abuse w/ acute withdrawal   Acute kidney injury   DVT prophylaxis: lovenox  Code Status: FULL CODE Family Communication: no family present at time of exam  Disposition Plan:   Consultants:  none  Procedures: none  Antimicrobials:  none   Objective: Blood pressure 122/80, pulse (!) 104, temperature 98 F (36.7 C), temperature source Oral, resp. rate 20, height 5\' 7"  (1.702 m), weight 73.5 kg (162 lb 0.6 oz), SpO2 100 %.  Intake/Output Summary (Last 24 hours) at 07/31/2017 1811 Last data filed at 07/31/2017 1810 Gross per 24 hour  Intake 4654.14 ml  Output 1800 ml  Net 2854.14 ml   Filed Weights   07/31/17 0200 07/31/17 0827  Weight: 76.5 kg (168 lb 10.4 oz) 73.5 kg (162 lb 0.6 oz)    Examination: Pt was seen for a f/u visit.    CBC: Recent Labs  Lab 07/30/17 2214  WBC 13.9*  HGB 16.2  HCT 46.3  MCV 77.8*  PLT 366   Basic Metabolic Panel: Recent Labs  Lab 07/30/17 2214 07/31/17 0247 07/31/17 0636 07/31/17 0908 07/31/17 1651  NA 132* 138 137 136 136  K 3.9 3.7 4.0 3.5 3.0*  CL 96* 108 109 105 109  CO2 11* 14* 16* 16* 18*  GLUCOSE 442* 230* 145* 165* 168*  BUN 11 12 12 12 10   CREATININE 1.85* 1.42* 1.25* 1.15 0.96  CALCIUM 9.6 8.5* 8.5* 8.5* 8.3*  MG  --  1.8  --   --   --    GFR: Estimated Creatinine Clearance: 94.7 mL/min (by C-G formula based on SCr of 0.96 mg/dL).  Liver Function Tests: Recent Labs  Lab 07/30/17 2214  AST 13*  ALT 10*    ALKPHOS 87  BILITOT 1.4*  PROT 8.6*  ALBUMIN 4.6   Recent Labs  Lab 07/30/17 2214  LIPASE 26   Recent Labs  Lab 07/31/17 0247  AMMONIA 16    HbA1C: Hgb A1c MFr Bld  Date/Time Value Ref Range Status  07/02/2017 05:01 AM 11.7 (H) 4.8 - 5.6 % Final    Comment:    (NOTE) Pre diabetes:          5.7%-6.4% Diabetes:              >6.4% Glycemic control for   <7.0% adults with diabetes   04/01/2015 06:15 AM 10.9 (H) 4.8 - 5.6 % Final    Comment:    (NOTE)         Pre-diabetes: 5.7 - 6.4         Diabetes: >6.4         Glycemic control for adults with diabetes: <7.0     CBG: Recent Labs  Lab 07/31/17 1257 07/31/17 1407 07/31/17 1510 07/31/17 1609 07/31/17 1707  GLUCAP 158* 151* 159* 171* 152*    Scheduled Meds: . enoxaparin (LOVENOX) injection  40 mg Subcutaneous Q24H  . feeding supplement (ENSURE ENLIVE)  237 mL Oral BID BM  . LORazepam  0-4 mg Intravenous Q6H   Or  . LORazepam  0-4 mg Oral Q6H  . [START ON 08/02/2017] LORazepam  0-4 mg Intravenous Q12H   Or  . [START ON 08/02/2017] LORazepam  0-4 mg Oral Q12H  . thiamine  100 mg Oral Daily   Or  . thiamine  100 mg Intravenous Daily   Continuous Infusions: . sodium chloride 125 mL/hr at 07/31/17 1259  . dextrose 5 % and 0.45% NaCl 75 mL/hr at 07/31/17 0318  . insulin (NOVOLIN-R) infusion 1.7 Units/hr (07/31/17 0636)     LOS: 0 days   Time spent: No Charge  Lonia BloodJeffrey T. Lynsee Wands, MD [facility] Office  [phone] Pager - Text Page per Amion as per below:  On-Call/Text Page:      [email or web address]      password [email or web address]  If 7PM-7AM, please contact night-coverage [email or web address] Password [email or web address] 07/31/2017, 6:11 PM

## 2017-07-31 NOTE — Progress Notes (Addendum)
Inpatient Diabetes Program Recommendations  AACE/ADA: New Consensus Statement on Inpatient Glycemic Control (2015)  Target Ranges:  Prepandial:   less than 140 mg/dL      Peak postprandial:   less than 180 mg/dL (1-2 hours)      Critically ill patients:  140 - 180 mg/dL   Lab Results  Component Value Date   GLUCAP 144 (H) 07/31/2017   HGBA1C 11.7 (H) 07/02/2017   Review of Glycemic Control  Diabetes history: DM 2 Outpatient Diabetes medications: 70/30 10 units BID Current orders for Inpatient glycemic control: IV insulin gtt  Inpatient Diabetes Program Recommendations:    CO2 still 16 this am. Will wait until next BMET to see if acidosis is cleared before transitioning off IV insulin. Tried to speak with patient this am but was lethargic and could not keep eyes open. Will re attempt later. Note patient is spanish speaking, not checking glucose in weeks and not taking insulin.  Note patient was seen by DM coordinator during last visit in December as well regarding A1c and glucose control. Please refer to note on 07/05/2017. Patient reported believing insulin had long term effects... Was placed on 70/30 10 units BID during that admission.  Thanks,  Christena DeemShannon Amro Winebarger RN, MSN, [facility] CenterCCN Inpatient Diabetes Coordinator Team Pager [phone] (8a-5p)

## 2017-08-01 ENCOUNTER — Inpatient Hospital Stay (HOSPITAL_COMMUNITY): Payer: Self-pay

## 2017-08-01 DIAGNOSIS — K1121 Acute sialoadenitis: Secondary | ICD-10-CM

## 2017-08-01 LAB — COMPREHENSIVE METABOLIC PANEL WITH GFR
ALT: 10 U/L — ABNORMAL LOW (ref 17–63)
AST: 14 U/L — ABNORMAL LOW (ref 15–41)
Albumin: 3.6 g/dL (ref 3.5–5.0)
Alkaline Phosphatase: 65 U/L (ref 38–126)
Anion gap: 11 (ref 5–15)
BUN: <5 mg/dL — ABNORMAL LOW (ref 6–20)
CO2: 20 mmol/L — ABNORMAL LOW (ref 22–32)
Calcium: 8.6 mg/dL — ABNORMAL LOW (ref 8.9–10.3)
Chloride: 107 mmol/L (ref 101–111)
Creatinine, Ser: 0.78 mg/dL (ref 0.61–1.24)
GFR calc Af Amer: >60 mL/min
GFR calc non Af Amer: >60 mL/min
Glucose, Bld: 165 mg/dL — ABNORMAL HIGH (ref 65–99)
Potassium: 3.3 mmol/L — ABNORMAL LOW (ref 3.5–5.1)
Sodium: 138 mmol/L (ref 135–145)
Total Bilirubin: 0.7 mg/dL (ref 0.3–1.2)
Total Protein: 6.5 g/dL (ref 6.5–8.1)

## 2017-08-01 LAB — GLUCOSE, CAPILLARY
GLUCOSE-CAPILLARY: 159 mg/dL — AB (ref 65–99)
GLUCOSE-CAPILLARY: 183 mg/dL — AB (ref 65–99)
GLUCOSE-CAPILLARY: 313 mg/dL — AB (ref 65–99)
Glucose-Capillary: 254 mg/dL — ABNORMAL HIGH (ref 65–99)
Glucose-Capillary: 128 mg/dL — ABNORMAL HIGH (ref 65–99)

## 2017-08-01 LAB — TROPONIN I
Troponin I: <0.03 ng/mL (ref <0.03)
Troponin I: <0.03 ng/mL (ref <0.03)
Troponin I: <0.03 ng/mL (ref <0.03)

## 2017-08-01 LAB — CBC
HCT: 40.4 % (ref 39.0–52.0)
Hemoglobin: 13.4 g/dL (ref 13.0–17.0)
MCH: 26 pg (ref 26.0–34.0)
MCHC: 33.2 g/dL (ref 30.0–36.0)
MCV: 78.3 fL (ref 78.0–100.0)
Platelets: 278 10*3/uL (ref 150–400)
RBC: 5.16 MIL/uL (ref 4.22–5.81)
RDW: 13.7 % (ref 11.5–15.5)
WBC: 6.8 10*3/uL (ref 4.0–10.5)

## 2017-08-01 MED ORDER — OXYCODONE HCL 5 MG/5ML PO SOLN
5.0000 mg | ORAL | Status: DC | PRN
  Administered 2017-08-01: 10 mg via ORAL
  Administered 2017-08-01: 5 mg via ORAL
  Administered 2017-08-02 – 2017-08-04 (×9): 10 mg via ORAL
  Filled 2017-08-01 (×6): qty 10
  Filled 2017-08-01: qty 5
  Filled 2017-08-01 (×4): qty 10

## 2017-08-01 MED ORDER — MORPHINE SULFATE (PF) 2 MG/ML IV SOLN: 2.0000 mg | Freq: Once | INTRAVENOUS | Status: DC

## 2017-08-01 MED ORDER — POTASSIUM CHLORIDE CRYS ER 20 MEQ PO TBCR
40.0000 meq | EXTENDED_RELEASE_TABLET | Freq: Once | ORAL | Status: AC
  Administered 2017-08-01: 40 meq via ORAL
  Filled 2017-08-01: qty 2

## 2017-08-01 MED ORDER — NITROGLYCERIN 0.4 MG SL SUBL
0.4000 mg | SUBLINGUAL_TABLET | SUBLINGUAL | Status: DC | PRN
  Administered 2017-08-01: 0.4 mg via SUBLINGUAL

## 2017-08-01 MED ORDER — NITROGLYCERIN 0.4 MG SL SUBL
SUBLINGUAL_TABLET | SUBLINGUAL | Status: AC
  Administered 2017-08-01: 03:00:00
  Filled 2017-08-01: qty 1

## 2017-08-01 MED ORDER — VANCOMYCIN HCL IN DEXTROSE 750-5 MG/150ML-% IV SOLN
750.0000 mg | Freq: Three times a day (TID) | INTRAVENOUS | Status: DC
  Administered 2017-08-01 – 2017-08-04 (×10): 750 mg via INTRAVENOUS
  Filled 2017-08-01 (×11): qty 150

## 2017-08-01 MED ORDER — LORAZEPAM 2 MG/ML IJ SOLN
0.0000 mg | INTRAMUSCULAR | Status: DC
  Administered 2017-08-01 (×2): 2 mg via INTRAVENOUS
  Administered 2017-08-01: 1 mg via INTRAVENOUS
  Administered 2017-08-01: 2 mg via INTRAVENOUS
  Filled 2017-08-01 (×4): qty 1

## 2017-08-01 MED ORDER — TRAMADOL HCL 50 MG PO TABS
50.0000 mg | ORAL_TABLET | Freq: Four times a day (QID) | ORAL | Status: DC | PRN
  Administered 2017-08-04: 50 mg via ORAL
  Filled 2017-08-01: qty 1

## 2017-08-01 MED ORDER — INSULIN ASPART PROT & ASPART (70-30 MIX) 100 UNIT/ML ~~LOC~~ SUSP
10.0000 [IU] | Freq: Two times a day (BID) | SUBCUTANEOUS | Status: DC
Start: 2017-08-01 — End: 2017-08-04
  Administered 2017-08-01 – 2017-08-04 (×6): 10 [IU] via SUBCUTANEOUS
  Filled 2017-08-01: qty 10

## 2017-08-01 MED ORDER — INSULIN ASPART 100 UNIT/ML ~~LOC~~ SOLN: 0.0000 [IU] | Freq: Every day | SUBCUTANEOUS | Status: DC

## 2017-08-01 MED ORDER — CLONIDINE HCL 0.1 MG PO TABS
0.1000 mg | ORAL_TABLET | Freq: Three times a day (TID) | ORAL | Status: DC
  Administered 2017-08-01 – 2017-08-03 (×6): 0.1 mg via ORAL
  Filled 2017-08-01 (×6): qty 1

## 2017-08-01 MED ORDER — INSULIN ASPART 100 UNIT/ML ~~LOC~~ SOLN
0.0000 [IU] | Freq: Three times a day (TID) | SUBCUTANEOUS | Status: DC
  Administered 2017-08-01: 8 [IU] via SUBCUTANEOUS
  Administered 2017-08-02: 5 [IU] via SUBCUTANEOUS
  Administered 2017-08-02 – 2017-08-03 (×2): 3 [IU] via SUBCUTANEOUS
  Administered 2017-08-03 (×2): 5 [IU] via SUBCUTANEOUS
  Administered 2017-08-04: 3 [IU] via SUBCUTANEOUS
  Administered 2017-08-04: 2 [IU] via SUBCUTANEOUS

## 2017-08-01 MED ORDER — PIPERACILLIN-TAZOBACTAM 3.375 G IVPB 30 MIN
3.3750 g | Freq: Four times a day (QID) | INTRAVENOUS | Status: DC
  Administered 2017-08-01 – 2017-08-02 (×4): 3.375 g via INTRAVENOUS
  Filled 2017-08-01 (×5): qty 50

## 2017-08-01 NOTE — Progress Notes (Signed)
Pt c/o CP and SOB; pt also visibly agitated. CIWA performed, as charted. EKG/CBG obtained. SL nitro given x 2 doses. Orders received for Tro[place], CXR, and more frequent CIWA. Medications administered per MAR. Will continue to monitor.

## 2017-08-01 NOTE — Progress Notes (Signed)
[place] TEAM 1 - Stepdown/ICU TEAM  Jacob Schneider  [id number] DOB: 09/12/1975 DOA: 07/30/2017 PCP: Patient, No Pcp Per    Brief Narrative:  42 y.o. male with a history of DM and EtOH abuse who presented to the ED with L sided chest pain with nausea, abd pain, and dry heaves.   He admitted to not regularly taking his insulin or checking his CBG.    In the ED he was found to be in DKA with AG 25, bicarb 11.    Subjective: CIWA has been elevated today / pt has been more agitated.  He has been off the insulin gtt since yesterday evening.  Had some c/o chest pain last night, w/ acute evaluation unrevealing.  Today he c/o B parotid area swelling w/ painful swallowing and pain in to his jaw and ears B. His parotid glands are very swollen, tense, and exquisitely tender to touch B.  This is a marked change since yesterday.      Assessment & Plan:  DM - DKA Off insulin gtt, but CBG not well controlled - adjust tx and follow  B Parotitis  Hydrate - tx w/ Zosyn and Vanc for now as this is hospital acquired - warm compresses   EtOH abuse w/ acute withdrawal  Cont CIWA protocol - add clonidine   Acute kidney injury  Resolved w/ volume expansion - most c/w pre-renal azotemia related to DKA  Hypokalemia Due to DKA - supplement and follow    DVT prophylaxis: lovenox  Code Status: FULL CODE Family Communication: no family present at time of exam  Disposition Plan: tele bed   Consultants:  none  Procedures: none  Antimicrobials:  none   Objective: Blood pressure (!) 153/106, pulse (!) 110, temperature 98.1 F (36.7 C), resp. rate (!) 8, height 5\' 7"  (1.702 m), weight 73.5 kg (162 lb 0.6 oz), SpO2 92 %.  Intake/Output Summary (Last 24 hours) at 08/01/2017 1340 Last data filed at 08/01/2017 1154 Gross per 24 hour  Intake 3891.14 ml  Output 3400 ml  Net 491.14 ml   Filed Weights   07/31/17 0200 07/31/17 0827  Weight: 76.5 kg (168 lb 10.4 oz) 73.5 kg (162 lb 0.6 oz)     Examination: General: No acute respiratory distress HEENT:  B parotid glands indurated, tense, warm, very tender to palpation  Lungs: Clear to auscultation bilaterally without wheezes or crackles Cardiovascular: tachycardic, regular, no M or rub  Abdomen: Nontender, nondistended, soft, bowel sounds positive, no rebound, no ascites, no appreciable mass Extremities: No significant cyanosis, clubbing, or edema bilateral lower extremities   CBC: Recent Labs  Lab 07/30/17 2214 08/01/17 0428  WBC 13.9* 6.8  HGB 16.2 13.4  HCT 46.3 40.4  MCV 77.8* 78.3  PLT 366 278   Basic Metabolic Panel: Recent Labs  Lab 07/31/17 0247 07/31/17 0636 07/31/17 0908 07/31/17 1651 08/01/17 0428  NA 138 137 136 136 138  K 3.7 4.0 3.5 3.0* 3.3*  CL 108 109 105 109 107  CO2 14* 16* 16* 18* 20*  GLUCOSE 230* 145* 165* 168* 165*  BUN 12 12 12 10  <5*  CREATININE 1.42* 1.25* 1.15 0.96 0.78  CALCIUM 8.5* 8.5* 8.5* 8.3* 8.6*  MG 1.8  --   --   --   --    GFR: Estimated Creatinine Clearance: 113.6 mL/min (by C-G formula based on SCr of 0.78 mg/dL).  Liver Function Tests: Recent Labs  Lab 07/30/17 2214 08/01/17 0428  AST 13* 14*  ALT  10* 10*  ALKPHOS 87 65  BILITOT 1.4* 0.7  PROT 8.6* 6.5  ALBUMIN 4.6 3.6   Recent Labs  Lab 07/30/17 2214  LIPASE 26   Recent Labs  Lab 07/31/17 0247  AMMONIA 16    HbA1C: Hgb A1c MFr Bld  Date/Time Value Ref Range Status  07/02/2017 05:01 AM 11.7 (H) 4.8 - 5.6 % Final    Comment:    (NOTE) Pre diabetes:          5.7%-6.4% Diabetes:              >6.4% Glycemic control for   <7.0% adults with diabetes   04/01/2015 06:15 AM 10.9 (H) 4.8 - 5.6 % Final    Comment:    (NOTE)         Pre-diabetes: 5.7 - 6.4         Diabetes: >6.4         Glycemic control for adults with diabetes: <7.0     CBG: Recent Labs  Lab 07/31/17 2016 07/31/17 2106 08/01/17 0233 08/01/17 0745 08/01/17 1204  GLUCAP 221* 260* 159* 183* 313*    Scheduled  Meds: . enoxaparin (LOVENOX) injection  40 mg Subcutaneous Q24H  . feeding supplement (ENSURE ENLIVE)  237 mL Oral BID BM  . insulin aspart  0-5 Units Subcutaneous QHS  . insulin aspart  0-9 Units Subcutaneous TID WC  . insulin glargine  10 Units Subcutaneous QHS  . [START ON 08/02/2017] LORazepam  0-4 mg Intravenous Q12H  . LORazepam  0-4 mg Intravenous Q4H  .  morphine injection  2 mg Intravenous Once  . thiamine  100 mg Oral Daily     LOS: 1 day   [name], MD [facility] Office  [phone] Pager - Text Page per Amion as per below:  On-Call/Text Page:      [email or web address]      password [email or web address]  If 7PM-7AM, please contact night-coverage [email or web address] Password [email or web address] 08/01/2017, 1:40 PM

## 2017-08-01 NOTE — Progress Notes (Signed)
Initial Nutrition Assessment  DOCUMENTATION CODES:   Not applicable  INTERVENTION:  Glucerna Shake po TID, each supplement provides 220 kcal and 10 grams of protein  D/C Ensure  NUTRITION DIAGNOSIS:   Inadequate oral intake related to acute illness, nausea, vomiting as evidenced by per patient/family report  GOAL:   Patient will meet greater than or equal to 90% of their needs  MONITOR:   PO intake, I & O's, Labs, Weight trends, Supplement acceptance  REASON FOR ASSESSMENT:   Malnutrition Screening Tool    ASSESSMENT:   42 y.o. male with a history of DM and EtOH abuse who presented to the ED with L sided chest pain with nausea, abd pain, and dry heaves.   He admitted to not regularly taking his insulin or checking his CBG.    Admitted with DKA. Was unable to provide any history today besides nodding yes or no. Continues to feel poorly. Nods yes to weight loss. Nods yes to eating ok prior to acute illness. Per chart, weight down 6 pounds in 25 days, insignificant for time frame. Meal completion 0-5% so far.  Labs reviewed:  CBGs 313, 189, 158 K+ 3.3  Medications reviewed and include:  Insulin, 40K+, Thiamine 100mg , NS at 5675mL/hr   NUTRITION - FOCUSED PHYSICAL EXAM:    Most Recent Value  Orbital Region  No depletion  Upper Arm Region  No depletion  Thoracic and Lumbar Region  No depletion  Buccal Region  No depletion  Temple Region  No depletion  Clavicle Bone Region  No depletion  Clavicle and Acromion Bone Region  No depletion  Scapular Bone Region  No depletion  Dorsal Hand  No depletion  Patellar Region  No depletion  Anterior Thigh Region  No depletion  Posterior Calf Region  No depletion  Edema (RD Assessment)  None  Hair  Reviewed  Eyes  Reviewed  Mouth  Reviewed  Skin  Reviewed  Nails  Reviewed       Diet Order:  Diet Carb Modified Fluid consistency: Thin; Room service appropriate? Yes  EDUCATION NEEDS:   Not appropriate for education at  this time  Skin:  Skin Assessment: Reviewed RN Assessment  Last BM:  PTA  Height:   Ht Readings from Last 1 Encounters:  07/31/17 5\' 7"  (1.702 m)    Weight:   Wt Readings from Last 1 Encounters:  07/31/17 162 lb 0.6 oz (73.5 kg)    Ideal Body Weight:  67.27 kg  BMI:  Body mass index is 25.38 kg/m.  Estimated Nutritional Needs:   Kcal:  [id number] calories (MSJ x1.2-1.3)  Protein:  88-110 grams (1.2-1.5g/kg)  Fluid:  1.9-2.1L  Dionne AnoWilliam M. Cordella Nyquist, MS, RD LDN Inpatient Clinical Dietitian Pager [phone]

## 2017-08-01 NOTE — Significant Event (Addendum)
Rapid Response Event Note During rounds pt reports chest pain  Overview: Time Called: 0235 Arrival Time: 0235 Event Type: Cardiac  Initial Focused Assessment: On arrival pt lying supine in bed, skin cool and clammy, mild tremors, alert and oriented x4. Interpreter called for translate. Pt reports pain is worse with a deep breath. BP 147/112, HR 118, RR 30, 100% RA. Pain 10/10 with no change after nitro sl x2.   Interventions: Nitro SL x2 no relief EKG, no change from previous CIWA changed to q4 Ativan 2mg  IVP Plan of Care (if not transferred): Continue to monitor, call as needed Event Summary: Name of Physician Notified: Bodenhiemer NP  at 0240    at    Outcome: Stayed in room and stabalized     [place] HeightsSHULAR, Jacob Schneider TracyPaige

## 2017-08-01 NOTE — Progress Notes (Signed)
B/P high but CIWA also scoring at 9 giving 1mg  ativan. Paged Dr. Stann MainlandWill continue to monitor.

## 2017-08-01 NOTE — Progress Notes (Signed)
Pharmacy Antibiotic Note  Jacob Schneider is a 42 y.o. male now with parotitis .  Pharmacy has been consulted for Vancomycin dosing.  Also on Zosyn  Plan: Vancomycin 750 mg iv Q 8 hours   Height: 5\' 7"  (170.2 cm) Weight: 162 lb 0.6 oz (73.5 kg) IBW/kg (Calculated) : 66.1  Temp (24hrs), Avg:98.2 F (36.8 C), Min:97.5 F (36.4 C), Max:99.1 F (37.3 C)  Recent Labs  Lab 07/30/17 2214 07/30/17 2222 07/31/17 0247 07/31/17 0636 07/31/17 0908 07/31/17 1651 08/01/17 0428  WBC 13.9*  --   --   --   --   --  6.8  CREATININE 1.85*  --  1.42* 1.25* 1.15 0.96 0.78  LATICACIDVEN  --  2.52*  --   --   --   --   --     Estimated Creatinine Clearance: 113.6 mL/min (by C-G formula based on SCr of 0.78 mg/dL).    No Known Allergies   Thank you Okey RegalLisa Kimesha Claxton, PharmD [phone]  08/01/2017 2:01 PM

## 2017-08-02 ENCOUNTER — Inpatient Hospital Stay (HOSPITAL_COMMUNITY): Payer: Self-pay

## 2017-08-02 DIAGNOSIS — K1120 Sialoadenitis, unspecified: Secondary | ICD-10-CM

## 2017-08-02 LAB — BASIC METABOLIC PANEL
ANION GAP: 12 (ref 5–15)
BUN: 5 mg/dL — AB (ref 6–20)
CALCIUM: 8.9 mg/dL (ref 8.9–10.3)
CO2: 23 mmol/L (ref 22–32)
CREATININE: 0.56 mg/dL — AB (ref 0.61–1.24)
Chloride: 101 mmol/L (ref 101–111)
GFR calc Af Amer: >60 mL/min (ref >60)
GLUCOSE: 187 mg/dL — AB (ref 65–99)
Potassium: 3.4 mmol/L — ABNORMAL LOW (ref 3.5–5.1)
Sodium: 136 mmol/L (ref 135–145)

## 2017-08-02 LAB — CBC
HCT: 39.1 % (ref 39.0–52.0)
HEMOGLOBIN: 13 g/dL (ref 13.0–17.0)
MCH: 26.1 pg (ref 26.0–34.0)
MCHC: 33.2 g/dL (ref 30.0–36.0)
MCV: 78.4 fL (ref 78.0–100.0)
Platelets: 243 10*3/uL (ref 150–400)
RBC: 4.99 MIL/uL (ref 4.22–5.81)
RDW: 13.7 % (ref 11.5–15.5)
WBC: 5.5 10*3/uL (ref 4.0–10.5)

## 2017-08-02 LAB — GLUCOSE, CAPILLARY
Glucose-Capillary: 191 mg/dL — ABNORMAL HIGH (ref 65–99)
Glucose-Capillary: 107 mg/dL — ABNORMAL HIGH (ref 65–99)
Glucose-Capillary: 202 mg/dL — ABNORMAL HIGH (ref 65–99)
Glucose-Capillary: 118 mg/dL — ABNORMAL HIGH (ref 65–99)

## 2017-08-02 MED ORDER — ADULT MULTIVITAMIN W/MINERALS CH
1.0000 | ORAL_TABLET | Freq: Every day | ORAL | Status: DC
  Administered 2017-08-02 – 2017-08-04 (×3): 1 via ORAL
  Filled 2017-08-02 (×3): qty 1

## 2017-08-02 MED ORDER — FOLIC ACID 1 MG PO TABS
1.0000 mg | ORAL_TABLET | Freq: Every day | ORAL | Status: DC
  Administered 2017-08-02 – 2017-08-04 (×3): 1 mg via ORAL
  Filled 2017-08-02 (×3): qty 1

## 2017-08-02 MED ORDER — POTASSIUM CHLORIDE CRYS ER 20 MEQ PO TBCR
60.0000 meq | EXTENDED_RELEASE_TABLET | Freq: Once | ORAL | Status: AC
  Administered 2017-08-02: 60 meq via ORAL
  Filled 2017-08-02: qty 3

## 2017-08-02 MED ORDER — IOPAMIDOL (ISOVUE-300) INJECTION 61%
INTRAVENOUS | Status: AC
  Administered 2017-08-02: 75 mL
  Filled 2017-08-02: qty 75

## 2017-08-02 MED ORDER — AMPICILLIN-SULBACTAM SODIUM 3 (2-1) G IJ SOLR
3.0000 g | Freq: Four times a day (QID) | INTRAMUSCULAR | Status: DC
  Administered 2017-08-02 – 2017-08-04 (×9): 3 g via INTRAVENOUS
  Filled 2017-08-02 (×11): qty 3

## 2017-08-02 NOTE — Progress Notes (Signed)
Inpatient Diabetes Program Recommendations  AACE/ADA: New Consensus Statement on Inpatient Glycemic Control (2015)  Target Ranges:  Prepandial:   less than 140 mg/dL      Peak postprandial:   less than 180 mg/dL (1-2 hours)      Critically ill patients:  140 - 180 mg/dL   Review of Glycemic Control  Diabetes history: DM 2 Outpatient Diabetes medications: None Current orders for Inpatient glycemic control: 70/30 10 units BID, Novolog MOderate 0-15 units tid + HS scale  Inpatient Diabetes Program Recommendations:    [facility] never got it due to the price. Patient was not working and could not afford the $25/vial over the counter insulin. Patient's A1c 11.7%. Spoke to patient at length about A1c and glucose goals. Spoke with patient about the need to take insulin to reduce complications.   Tried to ask questions related to his ETOH use. Patient reports he "only has that one drink that one day because he was tired of the pain." When I spoke through the interpreter that his withdrawal symptoms seems to be more than one drink, patient did not respond. Spoke with patient about ETOH effects on then Liver and Pancreas in relation to DM control.   Patient will need some sort of assistance to somehow get insulin for $25. Without insurance [facility] without having a sample through the clinic or through a match letter. CM to assess.  Thanks,  Christena DeemShannon Imane Burrough RN, MSN, [facility] Inpatient Diabetes Coordinator Team Pager [phone] (8a-5p)

## 2017-08-02 NOTE — Progress Notes (Signed)
Patient ID: Jacob Schneider, male   DOB: 1975/08/03, 42 y.o.   MRN: [id number]  PROGRESS NOTE    Jacob Schneider  WUJ:[id number] DOB: 1976/05/26 DOA: 07/30/2017 PCP: Patient, No Pcp Per   Brief Narrative:  42 year old male with history of diabetes mellitus and alcohol abuse presented on 07/30/2017 with left-sided chest pain with nausea, abdominal pain and dry heaves.  He was found to be in DKA and started on insulin drip.   Assessment & Plan:   Principal Problem:   DKA (diabetic ketoacidoses) (HCC) Active Problems:   DM (diabetes mellitus) type 2, uncontrolled   Alcohol withdrawal syndrome, with delirium (HCC)   AKI (acute kidney injury) (HCC)    DKA in a patient with diabetes mellitus type 2 and probable noncompliance - Off insulin drip -Continue 70/30 insulin.  Continue Accu-Cheks with coverage. -Hemoglobin A1c was 11.7 on 07/02/2017 -Consult diabetes coordinator  Probable bilateral parotitis  -Empirically started on vancomycin and Zosyn.  Patient still complains of bilateral jaw pain.  Will get CT maxillofacial with contrast.  Switch to Unasyn and Zosyn.  Follow cultures -Continue pain management  Alcohol abuse with alcohol withdrawal -Continue CIWA protocol and clonidine  -Continue thiamine.  Add folic acid and multivitamin  Acute kidney injury  - Resolved   Hypokalemia -Replace.  Repeat a.m. labs   DVT prophylaxis: Lovenox Code Status: Full Family Communication: None at bedside Disposition Plan: Depends on clinical outcome  Consultants: None  Procedures: None  Antimicrobials:  Anti-infectives (From admission, onward)   Start     Dose/Rate Route Frequency Ordered Stop   08/02/17 1000  Ampicillin-Sulbactam (UNASYN) 3 g in sodium chloride 0.9 % 100 mL IVPB     3 g 200 mL/hr over 30 Minutes Intravenous Every 6 hours 08/02/17 0913     08/01/17 1500  piperacillin-tazobactam (ZOSYN) IVPB 3.375 g  Status:  Discontinued     3.375 g 100 mL/hr over 30 Minutes  Intravenous Every 6 hours 08/01/17 1357 08/02/17 0913   08/01/17 1430  vancomycin (VANCOCIN) IVPB 750 mg/150 ml premix     750 mg 150 mL/hr over 60 Minutes Intravenous Every 8 hours 08/01/17 1403           Subjective: Patient seen and examined at bedside.  He still complains of bilateral jaw pain.  No overnight fever or vomiting.  Objective: Vitals:   08/01/17 1413 08/01/17 1629 08/02/17 0005 08/02/17 0509  BP:  (!) 149/111 (!) 126/92 133/89  Pulse:   96 90  Resp:   17 18  Temp: 98.8 F (37.1 C)  98.8 F (37.1 C) 99.1 F (37.3 C)  TempSrc: Oral  Oral Oral  SpO2:   98% 99%  Weight:      Height:        Intake/Output Summary (Last 24 hours) at 08/02/2017 1259 Last data filed at 08/02/2017 1141 Gross per 24 hour  Intake 2314.5 ml  Output 900 ml  Net 1414.5 ml   Filed Weights   07/31/17 0200 07/31/17 0827  Weight: 76.5 kg (168 lb 10.4 oz) 73.5 kg (162 lb 0.6 oz)    Examination:  General exam: Appears calm and comfortable  Respiratory system: Bilateral decreased breath sound at bases Cardiovascular system: S1 & S2 heard, rate controlled  gastrointestinal system: Abdomen is nondistended, soft and nontender. Normal bowel sounds heard. Extremities: No cyanosis, clubbing, edema    Data Reviewed: I have personally reviewed following labs and imaging studies  CBC: Recent Labs  Lab 07/30/17 2214 08/01/17 0428  08/02/17 0438  WBC 13.9* 6.8 5.5  HGB 16.2 13.4 13.0  HCT 46.3 40.4 39.1  MCV 77.8* 78.3 78.4  PLT 366 278 243   Basic Metabolic Panel: Recent Labs  Lab 07/31/17 0247 07/31/17 0636 07/31/17 0908 07/31/17 1651 08/01/17 0428 08/02/17 0438  NA 138 137 136 136 138 136  K 3.7 4.0 3.5 3.0* 3.3* 3.4*  CL 108 109 105 109 107 101  CO2 14* 16* 16* 18* 20* 23  GLUCOSE 230* 145* 165* 168* 165* 187*  BUN 12 12 12 10  <5* 5*  CREATININE 1.42* 1.25* 1.15 0.96 0.78 0.56*  CALCIUM 8.5* 8.5* 8.5* 8.3* 8.6* 8.9  MG 1.8  --   --   --   --   --    GFR: Estimated  Creatinine Clearance: 113.6 mL/min (A) (by C-G formula based on SCr of 0.56 mg/dL (L)). Liver Function Tests: Recent Labs  Lab 07/30/17 2214 08/01/17 0428  AST 13* 14*  ALT 10* 10*  ALKPHOS 87 65  BILITOT 1.4* 0.7  PROT 8.6* 6.5  ALBUMIN 4.6 3.6   Recent Labs  Lab 07/30/17 2214  LIPASE 26   Recent Labs  Lab 07/31/17 0247  AMMONIA 16   Coagulation Profile: No results for input(s): INR, PROTIME in the last 168 hours. Cardiac Enzymes: Recent Labs  Lab 08/01/17 0428 08/01/17 0800 08/01/17 1253  TROPONINI <0.03 <0.03 <0.03   BNP (last 3 results) No results for input(s): PROBNP in the last 8760 hours. HbA1C: No results for input(s): HGBA1C in the last 72 hours. CBG: Recent Labs  Lab 08/01/17 1204 08/01/17 1739 08/01/17 2238 08/02/17 0819 08/02/17 1236  GLUCAP 313* 254* 128* 191* 107*   Lipid Profile: No results for input(s): CHOL, HDL, LDLCALC, TRIG, CHOLHDL, LDLDIRECT in the last 72 hours. Thyroid Function Tests: No results for input(s): TSH, T4TOTAL, FREET4, T3FREE, THYROIDAB in the last 72 hours. Anemia Panel: No results for input(s): VITAMINB12, FOLATE, FERRITIN, TIBC, IRON, RETICCTPCT in the last 72 hours. Sepsis Labs: Recent Labs  Lab 07/30/17 2222  LATICACIDVEN 2.52*    No results found for this or any previous visit (from the past 240 hour(s)).       Radiology Studies: Dg Chest Port 1 View  Result Date: 08/01/2017 CLINICAL DATA:  Chest pain. EXAM: PORTABLE CHEST 1 VIEW COMPARISON:  Radiographs of July 30, 2017. FINDINGS: The heart size and mediastinal contours are within normal limits. Both lungs are clear. No pneumothorax or pleural effusion is noted. The visualized skeletal structures are unremarkable. IMPRESSION: No acute cardiopulmonary abnormality seen. Electronically Signed   By: Lupita RaiderJames  Green Jr, M.D.   On: 08/01/2017 08:46        Scheduled Meds: . cloNIDine  0.1 mg Oral TID  . enoxaparin (LOVENOX) injection  40 mg Subcutaneous  Q24H  . feeding supplement (ENSURE ENLIVE)  237 mL Oral BID BM  . insulin aspart  0-15 Units Subcutaneous TID WC  . insulin aspart  0-5 Units Subcutaneous QHS  . insulin aspart protamine- aspart  10 Units Subcutaneous BID WC  . LORazepam  0-4 mg Intravenous Q12H  . LORazepam  0-4 mg Intravenous Q4H  . thiamine  100 mg Oral Daily   Continuous Infusions: . sodium chloride 75 mL/hr at 08/02/17 0940  . ampicillin-sulbactam (UNASYN) IV 3 g (08/02/17 1120)  . vancomycin Stopped (08/02/17 [id number])     LOS: 2 days        Glade LloydKshitiz Duane Trias, MD [facility] Pager [phone]  If 7PM-7AM, please contact night-coverage  [email or web address] Password [facility] 08/02/2017, 12:59 PM

## 2017-08-03 LAB — CBC WITH DIFFERENTIAL/PLATELET
BASOS ABS: 0 10*3/uL (ref 0.0–0.1)
Basophils Relative: 0 %
EOS PCT: 3 %
Eosinophils Absolute: 0.2 10*3/uL (ref 0.0–0.7)
HEMATOCRIT: 40.5 % (ref 39.0–52.0)
Hemoglobin: 13.4 g/dL (ref 13.0–17.0)
LYMPHS PCT: 28 %
Lymphs Abs: 1.8 10*3/uL (ref 0.7–4.0)
MCH: 25.9 pg — ABNORMAL LOW (ref 26.0–34.0)
MCHC: 33.1 g/dL (ref 30.0–36.0)
MCV: 78.2 fL (ref 78.0–100.0)
Monocytes Absolute: 0.7 10*3/uL (ref 0.1–1.0)
Monocytes Relative: 11 %
NEUTROS ABS: 3.7 10*3/uL (ref 1.7–7.7)
NEUTROS PCT: 58 %
PLATELETS: 216 10*3/uL (ref 150–400)
RBC: 5.18 MIL/uL (ref 4.22–5.81)
RDW: 13.3 % (ref 11.5–15.5)
WBC: 6.4 10*3/uL (ref 4.0–10.5)

## 2017-08-03 LAB — GLUCOSE, CAPILLARY
GLUCOSE-CAPILLARY: 179 mg/dL — AB (ref 65–99)
GLUCOSE-CAPILLARY: 250 mg/dL — AB (ref 65–99)
GLUCOSE-CAPILLARY: 105 mg/dL — AB (ref 65–99)
Glucose-Capillary: 236 mg/dL — ABNORMAL HIGH (ref 65–99)

## 2017-08-03 LAB — COMPREHENSIVE METABOLIC PANEL
ALT: 9 U/L — AB (ref 17–63)
AST: 15 U/L (ref 15–41)
Albumin: 3.4 g/dL — ABNORMAL LOW (ref 3.5–5.0)
Alkaline Phosphatase: 56 U/L (ref 38–126)
Anion gap: 11 (ref 5–15)
BUN: 9 mg/dL (ref 6–20)
CHLORIDE: 100 mmol/L — AB (ref 101–111)
CO2: 26 mmol/L (ref 22–32)
Calcium: 9.6 mg/dL (ref 8.9–10.3)
Creatinine, Ser: 0.96 mg/dL (ref 0.61–1.24)
GFR calc Af Amer: >60 mL/min (ref >60)
Glucose, Bld: 186 mg/dL — ABNORMAL HIGH (ref 65–99)
Potassium: 3.5 mmol/L (ref 3.5–5.1)
Sodium: 137 mmol/L (ref 135–145)
Total Bilirubin: 0.8 mg/dL (ref 0.3–1.2)
Total Protein: 6.5 g/dL (ref 6.5–8.1)

## 2017-08-03 LAB — C-REACTIVE PROTEIN: CRP: 3.3 mg/dL — ABNORMAL HIGH (ref <1.0)

## 2017-08-03 LAB — MAGNESIUM: MAGNESIUM: 1.9 mg/dL (ref 1.7–2.4)

## 2017-08-03 NOTE — Progress Notes (Addendum)
Patient ID: Jacob Schneider, male   DOB: 01/31/1976, 42 y.o.   MRN: [id number]  PROGRESS NOTE    Jacob Schneider  [id number] DOB: 04/24/1976 DOA: 07/30/2017 PCP: Patient, No Pcp Per   Brief Narrative:  42 year old male with history of diabetes mellitus and alcohol abuse presented on 07/30/2017 with left-sided chest pain with nausea, abdominal pain and dry heaves.  He was found to be in DKA and started on insulin drip.   Assessment & Plan:   Principal Problem:   DKA (diabetic ketoacidoses) (HCC) Active Problems:   DM (diabetes mellitus) type 2, uncontrolled   Alcohol withdrawal syndrome, with delirium (HCC)   AKI (acute kidney injury) (HCC)    DKA in a patient with diabetes mellitus type 2 and probable noncompliance - Off insulin drip -Continue 70/30 insulin.  Continue Accu-Cheks with coverage. -Hemoglobin A1c was 11.7 on 07/02/2017 -Diabetes coordinator consult appreciated.  Care management consulted for help in obtaining insulin as an outpatient. -Continue carbohydrate modified diet.  Discontinue IV fluids  Probable bilateral parotitis  -Currently on vancomycin and Unasyn.  Patient still complains of bilateral jaw pain but improved since yesterday.  CT maxillofacial showed no drainable collections.  Follow cultures.  Will probably switch to oral antibiotics in the next 24 hours if patient continues to improve. -Continue pain management  Alcohol abuse with alcohol withdrawal -Continue CIWA protocol. DC clonidine as it might aggravate parotitis  -Continue thiamine, folic acid and multivitamin.  Acute kidney injury  - Resolved   Hypokalemia -Improved.  Repeat a.m. labs   DVT prophylaxis: Lovenox Code Status: Full Family Communication: None at bedside Disposition Plan: Home in the next 1-2 days if clinically improves  Consultants: None  Procedures: None  Antimicrobials:  Anti-infectives (From admission, onward)   Start     Dose/Rate Route Frequency Ordered Stop     08/02/17 1000  Ampicillin-Sulbactam (UNASYN) 3 g in sodium chloride 0.9 % 100 mL IVPB     3 g 200 mL/hr over 30 Minutes Intravenous Every 6 hours 08/02/17 0913     08/01/17 1500  piperacillin-tazobactam (ZOSYN) IVPB 3.375 g  Status:  Discontinued     3.375 g 100 mL/hr over 30 Minutes Intravenous Every 6 hours 08/01/17 1357 08/02/17 0913   08/01/17 1430  vancomycin (VANCOCIN) IVPB 750 mg/150 ml premix     750 mg 150 mL/hr over 60 Minutes Intravenous Every 8 hours 08/01/17 1403         Subjective: Patient seen and examined at bedside.  He still complains of bilateral jaw pain but is slightly better than yesterday.  No overnight fever or vomiting.  Objective: Vitals:   08/02/17 1338 08/02/17 2148 08/03/17 0511 08/03/17 0900  BP: (!) 146/95 125/89 (!) 133/93 (!) 143/112  Pulse: 99 93 84   Resp: 18 18 18    Temp: 98.5 F (36.9 C) 98.8 F (37.1 C) 99.4 F (37.4 C)   TempSrc: Oral Oral Oral   SpO2: 98% 97% 99%   Weight:      Height:        Intake/Output Summary (Last 24 hours) at 08/03/2017 1004 Last data filed at 08/03/2017 0815 Gross per 24 hour  Intake 3129.58 ml  Output 5525 ml  Net -2395.42 ml   Filed Weights   07/31/17 0200 07/31/17 0827  Weight: 76.5 kg (168 lb 10.4 oz) 73.5 kg (162 lb 0.6 oz)    Examination:  General exam: Appears calm and comfortable.  Bilateral jaw area slightly swollen and mildly tender, improved  since yesterday. Respiratory system: Bilateral decreased breath sound at bases Cardiovascular system: S1 & S2 heard, rate controlled  gastrointestinal system: Abdomen is nondistended, soft and nontender. Normal bowel sounds heard. Extremities: No cyanosis, clubbing, edema    Data Reviewed: I have personally reviewed following labs and imaging studies  CBC: Recent Labs  Lab 07/30/17 2214 08/01/17 0428 08/02/17 0438 08/03/17 0547  WBC 13.9* 6.8 5.5 6.4  NEUTROABS  --   --   --  3.7  HGB 16.2 13.4 13.0 13.4  HCT 46.3 40.4 39.1 40.5  MCV  77.8* 78.3 78.4 78.2  PLT 366 278 243 216   Basic Metabolic Panel: Recent Labs  Lab 07/31/17 0247  07/31/17 0908 07/31/17 1651 08/01/17 0428 08/02/17 0438 08/03/17 0547  NA 138   < > 136 136 138 136 137  K 3.7   < > 3.5 3.0* 3.3* 3.4* 3.5  CL 108   < > 105 109 107 101 100*  CO2 14*   < > 16* 18* 20* 23 26  GLUCOSE 230*   < > 165* 168* 165* 187* 186*  BUN 12   < > 12 10 <5* 5* 9  CREATININE 1.42*   < > 1.15 0.96 0.78 0.56* 0.96  CALCIUM 8.5*   < > 8.5* 8.3* 8.6* 8.9 9.6  MG 1.8  --   --   --   --   --  1.9   < > = values in this interval not displayed.   GFR: Estimated Creatinine Clearance: 94.7 mL/min (by C-G formula based on SCr of 0.96 mg/dL). Liver Function Tests: Recent Labs  Lab 07/30/17 2214 08/01/17 0428 08/03/17 0547  AST 13* 14* 15  ALT 10* 10* 9*  ALKPHOS 87 65 56  BILITOT 1.4* 0.7 0.8  PROT 8.6* 6.5 6.5  ALBUMIN 4.6 3.6 3.4*   Recent Labs  Lab 07/30/17 2214  LIPASE 26   Recent Labs  Lab 07/31/17 0247  AMMONIA 16   Coagulation Profile: No results for input(s): INR, PROTIME in the last 168 hours. Cardiac Enzymes: Recent Labs  Lab 08/01/17 0428 08/01/17 0800 08/01/17 1253  TROPONINI <0.03 <0.03 <0.03   BNP (last 3 results) No results for input(s): PROBNP in the last 8760 hours. HbA1C: No results for input(s): HGBA1C in the last 72 hours. CBG: Recent Labs  Lab 08/02/17 0819 08/02/17 1236 08/02/17 1706 08/02/17 2147 08/03/17 0750  GLUCAP 191* 107* 202* 118* 236*   Lipid Profile: No results for input(s): CHOL, HDL, LDLCALC, TRIG, CHOLHDL, LDLDIRECT in the last 72 hours. Thyroid Function Tests: No results for input(s): TSH, T4TOTAL, FREET4, T3FREE, THYROIDAB in the last 72 hours. Anemia Panel: No results for input(s): VITAMINB12, FOLATE, FERRITIN, TIBC, IRON, RETICCTPCT in the last 72 hours. Sepsis Labs: Recent Labs  Lab 07/30/17 2222  LATICACIDVEN 2.52*    No results found for this or any previous visit (from the past 240  hour(s)).       Radiology Studies: Ct Maxillofacial W Contrast  Result Date: 08/02/2017 CLINICAL DATA:  Initial evaluation for acute bilateral ear pain with swelling for 1 day, jaw pain. Suspected bilateral parotitis. EXAM: CT MAXILLOFACIAL WITH CONTRAST TECHNIQUE: Multidetector CT imaging of the maxillofacial structures was performed with intravenous contrast. Multiplanar CT image reconstructions were also generated. CONTRAST:  75mL ISOVUE-300 IOPAMIDOL (ISOVUE-300) INJECTION 61% COMPARISON:  None. FINDINGS: Osseous: No acute osseous abnormality about the face. Orbits: Globes orbital soft tissues within normal limits. Sinuses: Scattered mucoperiosteal thickening within the ethmoidal air cells, sphenoid sinuses,  and maxillary sinuses, chronic in appearance. No air-fluid levels to suggest acute sinusitis. Mastoid air cells and middle ear cavities are well pneumatized and clear. Soft tissues: Parotid glands are prominent bilaterally with fairly symmetric enhancement. Soft tissue swelling with hazy inflammatory stranding within the overlying subcutaneous fat of the carotid spaces, extending inferiorly towards the submandibular and submental region. Hazy stranding partially encompasses the submandibular glands bilaterally which are themselves felt to be within normal limits. Platysmas muscles are mildly thickened and irregular. Hazy inflammatory stranding extends posteriorly towards the posterior neck as well. Findings raise the possibility for acute bilateral parotitis. No obstructive calculi or sialolith identified. No discrete abscess or drainable fluid collection. No extension of inflammatory changes into the deeper spaces of the neck. No other acute inflammatory changes seen within the face or visualized neck. No adenopathy. Normal intravascular enhancement seen within the visualized neck. Oral cavity, oropharynx, nasopharynx, hypopharynx, and supraglottic larynx within normal limits. Limited intracranial:  Unremarkable. IMPRESSION: Symmetric prominence of the parotid glands bilaterally with associated soft tissue swelling and inflammatory stranding within the overlying soft tissues of the face, extending inferiorly into the submandibular and submental region. Findings raise the possibility for acute bilateral parotitis with associated cellulitis. No obstructive stones identified. No discrete abscess or drainable fluid collection. Electronically Signed   By: Rise Mu M.D.   On: 08/02/2017 13:36        Scheduled Meds: . cloNIDine  0.1 mg Oral TID  . enoxaparin (LOVENOX) injection  40 mg Subcutaneous Q24H  . feeding supplement (ENSURE ENLIVE)  237 mL Oral BID BM  . folic acid  1 mg Oral Daily  . insulin aspart  0-15 Units Subcutaneous TID WC  . insulin aspart  0-5 Units Subcutaneous QHS  . insulin aspart protamine- aspart  10 Units Subcutaneous BID WC  . LORazepam  0-4 mg Intravenous Q12H  . LORazepam  0-4 mg Intravenous Q4H  . multivitamin with minerals  1 tablet Oral Daily  . thiamine  100 mg Oral Daily   Continuous Infusions: . sodium chloride 50 mL/hr at 08/02/17 1325  . ampicillin-sulbactam (UNASYN) IV 3 g (08/03/17 0902)  . vancomycin Stopped (08/03/17 0730)     LOS: 3 days        Glade Lloyd, MD [facility] Pager [phone]  If 7PM-7AM, please contact night-coverage [email or web address] Password [email or web address] 08/03/2017, 10:04 AM

## 2017-08-03 NOTE — Progress Notes (Signed)
Pharmacy Antibiotic Note  Jacob Schneider is a 42 y.o. male now with parotitis .  Pharmacy has been consulted for Vancomycin dosing.  Streamlined from Zosyn to Unasyn  Plan: Continue Vancomycin 750 mg iv Q 8 hours Continue Unasyn 3 grams iv Q 6 hours  Po option to complete 1 week course - clindamycin 300 mg po TID   Height: 5\' 7"  (170.2 cm) Weight: 162 lb 0.6 oz (73.5 kg) IBW/kg (Calculated) : 66.1  Temp (24hrs), Avg:98.9 F (37.2 C), Min:98.5 F (36.9 C), Max:99.4 F (37.4 C)  Recent Labs  Lab 07/30/17 2214 07/30/17 2222  07/31/17 0908 07/31/17 1651 08/01/17 0428 08/02/17 0438 08/03/17 0547  WBC 13.9*  --   --   --   --  6.8 5.5 6.4  CREATININE 1.85*  --    < > 1.15 0.96 0.78 0.56* 0.96  LATICACIDVEN  --  2.52*  --   --   --   --   --   --    < > = values in this interval not displayed.    Estimated Creatinine Clearance: 94.7 mL/min (by C-G formula based on SCr of 0.96 mg/dL).    No Known Allergies   Thank you Okey RegalLisa Palmira Stickle, PharmD [phone]  08/03/2017 11:07 AM

## 2017-08-04 DIAGNOSIS — Z9114 Patient's other noncompliance with medication regimen: Secondary | ICD-10-CM

## 2017-08-04 LAB — BASIC METABOLIC PANEL
ANION GAP: 12 (ref 5–15)
BUN: 13 mg/dL (ref 6–20)
CHLORIDE: 100 mmol/L — AB (ref 101–111)
CO2: 26 mmol/L (ref 22–32)
Calcium: 9.6 mg/dL (ref 8.9–10.3)
Creatinine, Ser: 1.04 mg/dL (ref 0.61–1.24)
GFR calc Af Amer: >60 mL/min (ref >60)
GFR calc non Af Amer: >60 mL/min (ref >60)
GLUCOSE: 152 mg/dL — AB (ref 65–99)
POTASSIUM: 3.5 mmol/L (ref 3.5–5.1)
Sodium: 138 mmol/L (ref 135–145)

## 2017-08-04 LAB — CBC WITH DIFFERENTIAL/PLATELET
BASOS ABS: 0 10*3/uL (ref 0.0–0.1)
Basophils Relative: 0 %
Eosinophils Absolute: 0.2 10*3/uL (ref 0.0–0.7)
Eosinophils Relative: 2 %
HCT: 41.2 % (ref 39.0–52.0)
HEMOGLOBIN: 13.8 g/dL (ref 13.0–17.0)
LYMPHS ABS: 2.1 10*3/uL (ref 0.7–4.0)
LYMPHS PCT: 26 %
MCH: 26.4 pg (ref 26.0–34.0)
MCHC: 33.5 g/dL (ref 30.0–36.0)
MCV: 78.8 fL (ref 78.0–100.0)
Monocytes Absolute: 0.7 10*3/uL (ref 0.1–1.0)
Monocytes Relative: 9 %
NEUTROS ABS: 5.1 10*3/uL (ref 1.7–7.7)
NEUTROS PCT: 63 %
Platelets: 242 10*3/uL (ref 150–400)
RBC: 5.23 MIL/uL (ref 4.22–5.81)
RDW: 13.4 % (ref 11.5–15.5)
WBC: 8.1 10*3/uL (ref 4.0–10.5)

## 2017-08-04 LAB — GLUCOSE, CAPILLARY
GLUCOSE-CAPILLARY: 190 mg/dL — AB (ref 65–99)
GLUCOSE-CAPILLARY: 148 mg/dL — AB (ref 65–99)

## 2017-08-04 LAB — MAGNESIUM: Magnesium: 2 mg/dL (ref 1.7–2.4)

## 2017-08-04 LAB — C-REACTIVE PROTEIN: CRP: 4.5 mg/dL — ABNORMAL HIGH (ref <1.0)

## 2017-08-04 MED ORDER — BLOOD GLUCOSE MONITOR KIT: PACK | 0 refills | Status: DC

## 2017-08-04 MED ORDER — LISINOPRIL 2.5 MG PO TABS
2.5000 mg | ORAL_TABLET | Freq: Every day | ORAL | 1 refills | Status: DC
Start: 2017-08-04 — End: 2017-08-21

## 2017-08-04 MED ORDER — BISACODYL 10 MG RE SUPP
10.0000 mg | Freq: Once | RECTAL | Status: AC
  Administered 2017-08-04: 10 mg via RECTAL
  Filled 2017-08-04: qty 1

## 2017-08-04 MED ORDER — AMOXICILLIN-POT CLAVULANATE 875-125 MG PO TABS: 1.0000 | ORAL_TABLET | Freq: Two times a day (BID) | ORAL | 0 refills | Status: AC

## 2017-08-04 MED ORDER — INSULIN SYRINGES (DISPOSABLE) U-100 0.5 ML MISC: 1 refills | Status: DC

## 2017-08-04 MED ORDER — INSULIN NPH ISOPHANE & REGULAR (70-30) 100 UNIT/ML ~~LOC~~ SUSP: 10.0000 [IU] | Freq: Two times a day (BID) | SUBCUTANEOUS | 0 refills | Status: DC

## 2017-08-04 MED ORDER — GLUCERNA SHAKE PO LIQD
237.0000 mL | Freq: Three times a day (TID) | ORAL | Status: DC
  Administered 2017-08-04 (×2): 237 mL via ORAL

## 2017-08-04 MED ORDER — HYDROCHLOROTHIAZIDE 25 MG PO TABS: 25.0000 mg | ORAL_TABLET | Freq: Every day | ORAL | 0 refills | Status: DC

## 2017-08-04 NOTE — Progress Notes (Addendum)
Patient was discharged home by MD order; discharged instructions  review and give to patient and his brother with care notes and prescriptions; IV DIC; skin intact; patient refused to be escorted to the car by staff; he walked out with his brother.

## 2017-08-04 NOTE — Care Management Note (Signed)
Case Management Note  Patient Details  Name: Jacob Schneider MRN: [id number] Date of Birth: 03/22/1976  Subjective/Objective:   DKA, DM, ETOH, AKI                 Action/Plan: Discharge Planning: Spoke to pt via interpreter device. Pt states his previous MATCH letter expired and he did not get medications when he was dc. Explained to pt the importance of taking medications as prescribed. Reinstated MATCH letter (will provide in [place] and [place]). Gave permission to contact niece, Byrd HesselbachMaria. Spoke to niece and she will assist pt this time with taking him to pharmacy to get meds. Follow up appt scheduled for [facility] on 08/10/2017 at 0900 am. [facility] to pt [facility].    Expected Discharge Date:  08/04/17               Expected Discharge Plan:  Home/Self Care  In-House Referral:  Financial Counselor  Discharge planning Services  CM Consult, Medication Assistance, MATCH Program, Follow-up appt scheduled  Post Acute Care Choice:  NA Choice offered to:  NA  DME Arranged:  N/A DME Agency:  NA  HH Arranged:  NA HH Agency:  NA  Status of Service:  Completed, signed off  If discussed at Long Length of Stay Meetings, dates discussed:    Additional Comments:  Elliot CousinShavis, Savahanna Almendariz Ellen, RN 08/04/2017, 1:07 PM

## 2017-08-04 NOTE — Progress Notes (Signed)
Pt has not had a documented bowel movement since 01/29.  Paged provider Linton FlemingsX. Blount, who did not issue any new orders.  Will continue to monitor.

## 2017-08-04 NOTE — Progress Notes (Signed)
Pharmacy Antibiotic Note Rusty Auslfredo A Kato is a 42 y.o. male with parotitis .  Pharmacy has been consulted for vancomycin dosing; also on Unasyn. He is afebrile, WBC are normal, and renal function is fairly stable. Possible change to PO antibiotics today so will hold off on trough.  Plan: Vancomycin 750 mg IV q8h Unasyn 3 g IV q6h Will continue to follow   Height: 5\' 7"  (170.2 cm) Weight: 162 lb 0.6 oz (73.5 kg) IBW/kg (Calculated) : 66.1  Temp (24hrs), Avg:98.5 F (36.9 C), Min:98.2 F (36.8 C), Max:98.8 F (37.1 C)  Recent Labs  Lab 07/30/17 2214 07/30/17 2222  07/31/17 1651 08/01/17 0428 08/02/17 0438 08/03/17 0547 08/04/17 0254  WBC 13.9*  --   --   --  6.8 5.5 6.4 8.1  CREATININE 1.85*  --    < > 0.96 0.78 0.56* 0.96 1.04  LATICACIDVEN  --  2.52*  --   --   --   --   --   --    < > = values in this interval not displayed.    Estimated Creatinine Clearance: 87.4 mL/min (by C-G formula based on SCr of 1.04 mg/dL).    No Known Allergies    Loura BackJennifer Upper Elochoman, PharmD, BCPS Clinical Pharmacist Phone for today [phone] Main pharmacy - [phone] 08/04/2017 11:21 AM

## 2017-08-04 NOTE — Discharge Summary (Signed)
Physician Discharge Summary  Jacob Schneider FMB:[id number] DOB: 16-Jan-1976 DOA: 07/30/2017  PCP: Patient, No Pcp Per  Admit date: 07/30/2017 Discharge date: 08/04/2017  Time spent: 40 minutes  Recommendations for Outpatient Follow-up:  1. Overall glycemic control and compliance with medication regimen   Discharge Diagnoses:  Principal Problem:   DKA (diabetic ketoacidoses) ([place]) Active Problems:   Noncompliance with medication regimen   DM (diabetes mellitus) type 2, uncontrolled   Alcohol withdrawal syndrome, with delirium ([place])   AKI (acute kidney injury) ([place])   Discharge Condition: Stable and improved  Diet recommendation: Healthy heart low-carb  Filed Weights   07/31/17 0200 07/31/17 0827  Weight: 76.5 kg (168 lb 10.4 oz) 73.5 kg (162 lb 0.6 oz)     Hospital Course:  Jacob Schneider is a 42 year old male with a history of diabetes who has a very long history of not taking his medications chronically.  He presented to the emergency department in DKA.  He also has a long-standing history of alcohol abuse.  Was placed on insulin drip.  His acidosis resolved with IV fluids and insulin.  He is being switched back over to his 70/30 insulin.  We have arranged again for him to follow-up with the free clinic.  We have discussed with him the importance of taking his medications.  He did not exhibit any evidence of alcohol withdrawal at the time of discharge.  He also was noted to have parotiditis and will be given a course of Augmentin at discharge.  Our case management has worked very hard also to make sure that he gets his medications at the pharmacy.  He has been encouraged to stop drinking and has been given education concerning this also.   Discharge Exam: Vitals:   08/04/17 0358 08/04/17 1344  BP: (!) 137/95 138/88  Pulse: 91 (!) 106  Resp: 18 20  Temp: 98.8 F (37.1 C) 98.4 F (36.9 C)  SpO2: 98% 98%    General: Alert and oriented x4 no apparent distress very cooperative and  friendly Cardiovascular: Regular rate and rhythm without murmurs rubs or gallops Respiratory: Clear to auscultation bilaterally no wheezes rhonchi rales  Discharge Instructions   Discharge Instructions    Diet - low sodium heart healthy   Complete by:  As directed    Increase activity slowly   Complete by:  As directed      Allergies as of 08/04/2017   No Known Allergies     Medication List    STOP taking these medications   cyclobenzaprine 5 MG tablet Commonly known as:  FLEXERIL   tamsulosin 0.4 MG Caps capsule Commonly known as:  FLOMAX     TAKE these medications   amoxicillin-clavulanate 875-125 MG tablet Commonly known as:  AUGMENTIN Take 1 tablet by mouth 2 (two) times daily for 10 days.   blood glucose meter kit and supplies Kit Dispense based on patient and insurance preference. Use up to four times daily as directed. (FOR ICD-9 250.00, 250.01).   hydrochlorothiazide 25 MG tablet Commonly known as:  HYDRODIURIL Take 1 tablet (25 mg total) by mouth daily.   insulin NPH-regular Human (70-30) 100 UNIT/ML injection Commonly known as:  NOVOLIN 70/30 Inject 10 Units into the skin 2 (two) times daily with a meal.   Insulin Syringes (Disposable) U-100 0.5 ML Misc Take insulin as directed   lisinopril 2.5 MG tablet Commonly known as:  PRINIVIL,ZESTRIL Take 1 tablet (2.5 mg total) by mouth daily.   traMADol 50 MG tablet Commonly  known as:  ULTRAM Take 1 tablet (50 mg total) by mouth every 8 (eight) hours as needed for moderate pain.      No Known Allergies Follow-up Information    [facility]. Go on 08/10/2017.   Specialty:  Internal Medicine Why:  9am with Cammie Sickle NP Contact information: [place] [phone]           The results of significant diagnostics from this hospitalization (including imaging, microbiology, ancillary and laboratory) are listed below for reference.     Significant Diagnostic Studies: Dg Chest 2 View  Result Date: 07/30/2017 CLINICAL DATA:  Severe chest pain, fever, and cough for 1 week. EXAM: CHEST  2 VIEW COMPARISON:  03/31/2015 FINDINGS: The heart size and mediastinal contours are within normal limits. Both lungs are clear. The visualized skeletal structures are unremarkable. IMPRESSION: No active cardiopulmonary disease. Electronically Signed   By: Earle Gell M.D.   On: 07/30/2017 22:42   Ct Maxillofacial W Contrast  Result Date: 08/02/2017 CLINICAL DATA:  Initial evaluation for acute bilateral ear pain with swelling for 1 day, jaw pain. Suspected bilateral parotitis. EXAM: CT MAXILLOFACIAL WITH CONTRAST TECHNIQUE: Multidetector CT imaging of the maxillofacial structures was performed with intravenous contrast. Multiplanar CT image reconstructions were also generated. CONTRAST:  69m ISOVUE-300 IOPAMIDOL (ISOVUE-300) INJECTION 61% COMPARISON:  None. FINDINGS: Osseous: No acute osseous abnormality about the face. Orbits: Globes orbital soft tissues within normal limits. Sinuses: Scattered mucoperiosteal thickening within the ethmoidal air cells, sphenoid sinuses, and maxillary sinuses, chronic in appearance. No air-fluid levels to suggest acute sinusitis. Mastoid air cells and middle ear cavities are well pneumatized and clear. Soft tissues: Parotid glands are prominent bilaterally with fairly symmetric enhancement. Soft tissue swelling with hazy inflammatory stranding within the overlying subcutaneous fat of the carotid spaces, extending inferiorly towards the submandibular and submental region. Hazy stranding partially encompasses the submandibular glands bilaterally which are themselves felt to be within normal limits. Platysmas muscles are mildly thickened and irregular. Hazy inflammatory stranding extends posteriorly towards the posterior neck as well. Findings raise the possibility for acute bilateral parotitis. No obstructive calculi or  sialolith identified. No discrete abscess or drainable fluid collection. No extension of inflammatory changes into the deeper spaces of the neck. No other acute inflammatory changes seen within the face or visualized neck. No adenopathy. Normal intravascular enhancement seen within the visualized neck. Oral cavity, oropharynx, nasopharynx, hypopharynx, and supraglottic larynx within normal limits. Limited intracranial: Unremarkable. IMPRESSION: Symmetric prominence of the parotid glands bilaterally with associated soft tissue swelling and inflammatory stranding within the overlying soft tissues of the face, extending inferiorly into the submandibular and submental region. Findings raise the possibility for acute bilateral parotitis with associated cellulitis. No obstructive stones identified. No discrete abscess or drainable fluid collection. Electronically Signed   By: BJeannine BogaM.D.   On: 08/02/2017 13:36   Dg Chest Port 1 View  Result Date: 08/01/2017 CLINICAL DATA:  Chest pain. EXAM: PORTABLE CHEST 1 VIEW COMPARISON:  Radiographs of July 30, 2017. FINDINGS: The heart size and mediastinal contours are within normal limits. Both lungs are clear. No pneumothorax or pleural effusion is noted. The visualized skeletal structures are unremarkable. IMPRESSION: No acute cardiopulmonary abnormality seen. Electronically Signed   By: JMarijo Conception M.D.   On: 08/01/2017 08:46   Ct Angio Chest/abd/pel For Dissection W And/or Wo Contrast  Result Date: 07/31/2017 CLINICAL DATA:  Left-sided chest pain starting today. EXAM: CT  ANGIOGRAPHY CHEST, ABDOMEN AND PELVIS TECHNIQUE: Multidetector CT imaging through the chest, abdomen and pelvis was performed using the standard protocol during bolus administration of intravenous contrast. Multiplanar reconstructed images and MIPs were obtained and reviewed to evaluate the vascular anatomy. CONTRAST:  28m ISOVUE-370 IOPAMIDOL (ISOVUE-370) INJECTION 76% COMPARISON:   CT angiogram dated 07/01/2017. FINDINGS: CTA CHEST FINDINGS Cardiovascular: No thoracic aortic aneurysm or dissection. Heart size is normal. No pericardial effusion. No pulmonary embolism identified within the main, lobar or segmental pulmonary arteries bilaterally. Mediastinum/Nodes: No mass or enlarged lymph nodes within the mediastinum or perihilar regions. Esophagus appears normal. Trachea and central bronchi are unremarkable. Lungs/Pleura: Lungs are clear.  No pleural effusion or pneumothorax. Musculoskeletal: No chest wall abnormality. No acute or significant osseous findings. Review of the MIP images confirms the above findings. CTA ABDOMEN AND PELVIS FINDINGS VASCULAR Aorta: Normal caliber aorta without aneurysm, dissection, vasculitis or significant stenosis. Celiac: Patent without evidence of aneurysm, dissection, vasculitis or significant stenosis. SMA: Patent without evidence of aneurysm, dissection, vasculitis or significant stenosis. Renals: Both renal arteries are patent without evidence of aneurysm, dissection, vasculitis, fibromuscular dysplasia or significant stenosis. IMA: Patent without evidence of aneurysm, dissection, vasculitis or significant stenosis. Inflow: Patent without evidence of aneurysm, dissection, vasculitis or significant stenosis. Veins: No obvious venous abnormality within the limitations of this arterial phase study. Review of the MIP images confirms the above findings. NON-VASCULAR Hepatobiliary: No focal liver abnormality is seen. No gallstones, gallbladder wall thickening, or biliary dilatation. Pancreas: Unremarkable. No pancreatic ductal dilatation or surrounding inflammatory changes. Spleen: Normal in size without focal abnormality. Adrenals/Urinary Tract: Adrenal glands appear normal. Kidneys appear normal without mass, stone or hydronephrosis. No ureteral or bladder calculi identified. Stomach/Bowel: Bowel is normal in caliber. No bowel wall thickening or evidence of  bowel wall inflammation seen. Appendix is normal. Lymphatic: No significant vascular findings are present. No enlarged abdominal or pelvic lymph nodes. Reproductive: Prostate is unremarkable. Other: No free fluid or abscess collection. No free intraperitoneal air. Musculoskeletal: No acute or significant osseous findings. Review of the MIP images confirms the above findings. IMPRESSION: Normal CT angiogram of the chest, abdomen and pelvis. No aortic aneurysm or dissection. Electronically Signed   By: SFranki CabotM.D.   On: 07/31/2017 00:12    Microbiology: Recent Results (from the past 240 hour(s))  Culture, blood (routine x 2)     Status: None (Preliminary result)   Collection Time: 08/02/17  1:47 PM  Result Value Ref Range Status   Specimen Description BLOOD LEFT ANTECUBITAL  Final   Special Requests   Final    BOTTLES DRAWN AEROBIC AND ANAEROBIC Blood Culture adequate volume   Culture   Final    NO GROWTH 2 DAYS [facility]formed at [facility] [address] [place] [address]   Report Status PENDING  Incomplete  Culture, blood (routine x 2)     Status: None (Preliminary result)   Collection Time: 08/02/17  2:05 PM  Result Value Ref Range Status   Specimen Description BLOOD RIGHT ANTECUBITAL  Final   Special Requests   Final    BOTTLES DRAWN AEROBIC AND ANAEROBIC Blood Culture adequate volume   Culture   Final    NO GROWTH 2 DAYS Performed at [facility] [address] [place] [address]   Report Status PENDING  Incomplete     Labs: Basic Metabolic Panel: Recent Labs  Lab 07/31/17 0247  07/31/17 1651 08/01/17 0428 08/02/17 0438 08/03/17  0547 08/04/17 0254  NA 138   < > 136 138 136 137 138  K 3.7   < > 3.0* 3.3* 3.4* 3.5 3.5  CL 108   < > 109 107 101 100* 100*  CO2 14*   < > 18* 20* 23 26 26   GLUCOSE 230*   < > 168* 165* 187* 186* 152*  BUN 12   < > 10 <5* 5* 9 13  CREATININE 1.42*   < > 0.96 0.78 0.56* 0.96 1.04  CALCIUM 8.5*   < > 8.3*  8.6* 8.9 9.6 9.6  MG 1.8  --   --   --   --  1.9 2.0   < > = values in this interval not displayed.   Liver Function Tests: Recent Labs  Lab 07/30/17 2214 08/01/17 0428 08/03/17 0547  AST 13* 14* 15  ALT 10* 10* 9*  ALKPHOS 87 65 56  BILITOT 1.4* 0.7 0.8  PROT 8.6* 6.5 6.5  ALBUMIN 4.6 3.6 3.4*   Recent Labs  Lab 07/30/17 2214  LIPASE 26   Recent Labs  Lab 07/31/17 0247  AMMONIA 16   CBC: Recent Labs  Lab 07/30/17 2214 08/01/17 0428 08/02/17 0438 08/03/17 0547 08/04/17 0254  WBC 13.9* 6.8 5.5 6.4 8.1  NEUTROABS  --   --   --  3.7 5.1  HGB 16.2 13.4 13.0 13.4 13.8  HCT 46.3 40.4 39.1 40.5 41.2  MCV 77.8* 78.3 78.4 78.2 78.8  PLT 366 278 243 216 242   Cardiac Enzymes: Recent Labs  Lab 08/01/17 0428 08/01/17 0800 08/01/17 1253  TROPONINI <0.03 <0.03 <0.03   BNP: BNP (last 3 results) No results for input(s): BNP in the last 8760 hours.  ProBNP (last 3 results) No results for input(s): PROBNP in the last 8760 hours.  CBG: Recent Labs  Lab 08/03/17 1223 08/03/17 1700 08/03/17 2231 08/04/17 0747 08/04/17 1142  GLUCAP 179* 250* 105* 190* 148*       Signed:  Shawne Eskelson A MD.  [facility] 08/04/2017, 4:13 PM

## 2017-08-07 LAB — CULTURE, BLOOD (ROUTINE X 2)
Culture: NO GROWTH
Culture: NO GROWTH
SPECIAL REQUESTS: ADEQUATE
Special Requests: ADEQUATE

## 2017-08-10 ENCOUNTER — Ambulatory Visit: Payer: Self-pay | Admitting: Family Medicine

## 2017-08-21 ENCOUNTER — Encounter: Payer: Self-pay | Admitting: Family Medicine

## 2017-08-21 ENCOUNTER — Ambulatory Visit (INDEPENDENT_AMBULATORY_CARE_PROVIDER_SITE_OTHER): Payer: Self-pay | Admitting: Family Medicine

## 2017-08-21 VITALS — BP 117/89 | HR 98 | Temp 98.4°F | Resp 14 | Ht 64.0 in | Wt 161.0 lb

## 2017-08-21 DIAGNOSIS — Z23 Encounter for immunization: Secondary | ICD-10-CM

## 2017-08-21 DIAGNOSIS — E1110 Type 2 diabetes mellitus with ketoacidosis without coma: Secondary | ICD-10-CM

## 2017-08-21 DIAGNOSIS — I10 Essential (primary) hypertension: Secondary | ICD-10-CM

## 2017-08-21 DIAGNOSIS — G629 Polyneuropathy, unspecified: Secondary | ICD-10-CM

## 2017-08-21 LAB — POCT URINALYSIS DIP (DEVICE)
Bilirubin Urine: NEGATIVE
GLUCOSE, UA: NEGATIVE mg/dL
Hgb urine dipstick: NEGATIVE
Ketones, ur: NEGATIVE mg/dL
Leukocytes, UA: NEGATIVE
NITRITE: NEGATIVE
PH: 6 (ref 5.0–8.0)
PROTEIN: 30 mg/dL — AB
Specific Gravity, Urine: 1.02 (ref 1.005–1.030)
UROBILINOGEN UA: 0.2 mg/dL (ref 0.0–1.0)

## 2017-08-21 LAB — GLUCOSE, CAPILLARY: GLUCOSE-CAPILLARY: 163 mg/dL — AB (ref 65–99)

## 2017-08-21 LAB — POCT GLYCOSYLATED HEMOGLOBIN (HGB A1C): HEMOGLOBIN A1C: 10.5

## 2017-08-21 MED ORDER — GABAPENTIN 100 MG PO CAPS: 100.0000 mg | ORAL_CAPSULE | Freq: Three times a day (TID) | ORAL | 1 refills | Status: DC

## 2017-08-21 MED ORDER — LISINOPRIL 2.5 MG PO TABS: 2.5000 mg | ORAL_TABLET | Freq: Every day | ORAL | 5 refills | Status: DC

## 2017-08-21 MED ORDER — METFORMIN HCL 500 MG PO TABS: 500.0000 mg | ORAL_TABLET | Freq: Two times a day (BID) | ORAL | 5 refills | Status: DC

## 2017-08-21 MED ORDER — HYDROCHLOROTHIAZIDE 25 MG PO TABS
25.0000 mg | ORAL_TABLET | Freq: Every day | ORAL | 5 refills | Status: DC
Start: 2017-08-21 — End: 2018-08-27

## 2017-08-21 MED FILL — ?LISINOPRIL 2.5 MG TABLET: 2.5 | 30 days supply | Qty: 30 | Fill #0

## 2017-08-21 MED FILL — HYDROCHLOROTHIAZIDE 25 MG T: 25 | 30 days supply | Qty: 30 | Fill #0

## 2017-08-21 MED FILL — ?METFORMIN HCL 500MG TABLET: 500 | 30 days supply | Qty: 60 | Fill #0

## 2017-08-21 MED FILL — GABAPENTIN 100 MG CAPSULE: 100 | 30 days supply | Qty: 90 | Fill #0

## 2017-08-21 NOTE — Patient Instructions (Addendum)
You have uncontrolled type 2 diabetes mellitus.  Your hemoglobin A1c is currently 10.5.  Goal is less than 7.  Upon awakening your blood sugar should range between 110-140.  Continue to check blood sugars prior to meals and at bedtime.  We will continue Novolin 70-30 twice daily at 10 units.  We will add metformin 500 mg twice daily with meals.  Recommend a low carbohydrate diet divided over 5-6 small meals throughout the day.   Your blood pressure is at goal on current medication regimen.  Will continue hydrochlorothiazide and lisinopril as previously prescribed.   Usted tiene diabetes mellitus tipo 2 no controlada.  Su hemoglobina A1C es actualmente 10,5.  La meta es menos de 7.  Al despertar su azcar en la sangre debe oscile entre 110 - 140.  Contine verifican los azcares sanguneos antes de las comidas y a la hora de [place].  Continuaremos Novolin 70 - 30 dos veces al da en 10 unidades.  Agregaremos metformina 500 mg [place] con las comidas.  Recomiende una dieta baja en carbohidratos dividida en 5-6 comidas pequeas durante todo el da.  Su presin arterial est a la meta en el rgimen de medicacin actual.  Continuar hidroclorotiazida y lisinopril segn lo prescrito previamente.    Hipoglucemia (Hypoglycemia) La hipoglucemia se produce cuando el nivel de azcar (glucosa) en la sangre es demasiado bajo. Los sntomas de la glucemia baja pueden incluir los siguientes:  Sentir que tiene lo siguiente: ? Apetito. ? Preocupacin o nervios (ansioso). ? Sudoracin y Intel Corporation. ? Confusin. ? Mareos. ? Somnolencia. ? Nuseas.  Tener lo siguiente: ? Latidos cardacos acelerados. ? Dolor de [place]. ? Cambios en la visin. ? Una crisis de movimientos que no puede controlar (convulsiones). ? Pesadillas. ? Hormigueo y falta de sensibilidad (adormecimiento) alrededor de la boca, los labios o la [place].  Dificultades para hacer lo siguiente: ? Hablar. ? Prestar atencin  (concentrarse). ? Moverse (coordinacin). ? Dormir.  Temblores.  Desmayos.  Molestarse con facilidad (irritabilidad). Las personas que tienen diabetes y las que no tienen la enfermedad pueden tener la glucemia baja. El nivel bajo de glucemia en la sangre puede ocurrir rpidamente y ser Engineer, maintenance (IT). Tratamiento de la glucemia baja Generalmente, el tratamiento de la glucemia baja consiste en ingerir de inmediato un alimento o una bebida que contengan azcar. Si puede pensar con claridad y tragar de manera segura, siga la regla 15/15, que consiste en lo siguiente:  Consumir 15gramos de un hidrato de carbono de accin rpida. Algunos hidratos de carbono de accin rpida son los siguientes: ? 1pomo de glucosa en gel. ? 3comprimidos de azcar (comprimidos de glucosa). ? 6 a 8unidades de caramelos duros. ? 4onzas (18m) de jugo de frutas. ? 4onzas (1238m de gaseosa comn (no diettica).  Contrlese la glucemia 1578mtos despus de ingerir el hidrato de carbono.  Si el nivel de glucosa en la sangre todava es igual o menor que 56m69m (3,9mmo78m), ingiera nuevamente 15gramos de un hidrato de carbono.  Si el nivel de glucosa en la sangre no supera los 56mg/43m3,9mmol/21mdespus de 3intentos, solicite ayuda de inmediato.  Ingiera una comida o una colacin en el transcurso de 1hora despus de que la glucemia se haya normalizado. Tratamiento de la glucemia muy baja Si el nivel de glucosa en la sangre es igual o menor que 54mg/dl88mmol/l)40mignifica que est muy bajo (hipoglucemia grave). Esto es una emergEngineer, maintenance (IT)re hasta que los sntomas desaparezcan. Solicite atencin mdica de inmediato.  Comunquese con el servicio de emergencias de su localidad (911 en los [place]). No conduzca por sus propios medios Principal Financial. Si su nivel de glucosa en la sangre muy bajo y no puede ingerir ningn alimento ni bebida, tal vez deba aplicarse una inyeccin de glucagn.  Un familiar o un amigo deben aprender a controlarle la glucemia y a aplicarle una inyeccin de glucagn. Pregntele al mdico si debe tener un kit de inyecciones de glucagn en su casa. CUIDADOS EN EL HOGAR Instrucciones generales  Evite cualquier dieta que le impida ingerir la cantidad suficiente de comida. Hable con el mdico antes de comenzar una dieta nueva.  Tome los medicamentos de venta libre y los recetados solamente como se lo haya indicado el mdico.  Limite el consumo de alcohol a no ms de 69mdida por da si es mujer y no est eSan Marinoy a 279midas por da si es hombre. Una medida equivale a 12onzas de cerveza, 5onzas de vino o 1onzas de bebidas alcohlicas de alta graduacin.  Concurra a todas las visitas de control como se lo haya indicado el mdico. Esto es importante. Si usted tiene diabetes:  Asegrese de coAssurante la hipoglucemia.  Siempre tenga a mano una fuente de azcar, como por ejemplo: ? Azcar. ? Comprimidos de azcar. ? Gel de glucosa. ? Jugo de frutas. ? Gaseosa comn (gaseosa que no sea diettica). ? Leche. ? Caramelos duros. ? Miel.  Tome los medicamentos segn las indicaciones.  Siga el plan de ejercicios y de alimentacin. ? Coma a horario. No omita comidas. ? Siga el plan para los das de enfermedad cuando no pueda comer o beber normalmente. Arme este plan de antemano con el mdico.  Contrlese la glucemia con la frecuencia que le haya indicado el mdico. Contrlesela siempre antes y despus de hacer actividad fsica.  Comparta su plan de atencin de la diabetes con estas personas: ? Compaeros de trabajo o de la escuela. ? Aquellas con las que coHeartland Hgase anlisis de orina para deProduct managerresencia de cetonas: ? Cuando est enfermo. ? Como se lo haya indicado el mdico.  Lleve consigo una tarjeta, o use un brazalete o una medalla que indiquen que es diabtico. Si tiene un nivel bajo de glucosa en la sangre debido a  otras causas:  Contrlese la glucemia con la frecuencia que le haya indicado el mdico.  Siga las indicaciones del mdico respecto de lo que no puede comer o beber. SOLICITE AYUDA SI:  Tiene problemas para mantener el nivel de glucosa en la sangre dentro del rango indicado.  Tiene la glucemia baja con frecuencia. SOLICITE AYUDA DE INMEDIATO SI:  Contina teniendo sntomas despus de haber comido o ingerido algo con azcar.  La glucemia es igual o inferior a 5474ml (3mm60mdl).  Tiene una crisis de movimientos que no puedIT consultante desmaya. Estos sntomas pueden indiSales executive espere hasta que los sntomas desaparezcan. Solicite atencin mdica de inmediato. Comunquese con el servicio de emergencias de su localidad (911 en los [place]). No conduzca por sus propios medios hastPrincipal Financialta informacin no tiene comoMarine scientistconsejo del mdico. Asegrese de hacerle al mdico cualquier pregunta que tenga. Document Released: 07/23/2010 Document Revised: 10/12/2015 Document Reviewed: 07/24/2015 Elsevier Interactive Patient Education  2018 ElseReynolds Americaniabetes mellitus y nutricin Diabetes Mellitus and Nutrition Si sufre de diabetes (diabetes mellitus), es muy importante tener hbitos alimenticios saludables debido a que sus niveles de azcaLocation manager  en la sangre (glucosa) se ven afectados en gran medida por lo que come y bebe. [place] alimentos saludables en las cantidades [place], aproximadamente a la [employer] Technologies Corporation, [place] ayudar a:  Aeronautical engineer glucemia.  Disminuir el riesgo de sufrir una enfermedad cardaca.  Mejorar la presin arterial.  Science writer o mantener un peso saludable.  Todas las personas que sufren de diabetes son diferentes y cada una tiene necesidades diferentes en cuanto a un plan de alimentacin. El mdico puede recomendarle que trabaje con un especialista en dietas y nutricin (nutricionista) para Financial trader plan  para usted. Su plan de alimentacin puede variar segn factores como:  Las caloras que necesita.  Los medicamentos que toma.  Su peso.  Sus niveles de glucemia, presin arterial y colesterol.  Su nivel de [place].  Otras afecciones que tenga, como enfermedades cardacas o renales.  Cmo me afectan los carbohidratos? Los carbohidratos afectan el nivel de glucemia ms que cualquier otro tipo de alimento. La ingesta de carbohidratos naturalmente aumenta la cantidad glucosa en la sangre. El recuento de carbohidratos es un mtodo destinado a Catering manager un registro de la cantidad de carbohidratos que se ingieren. El recuento de carbohidratos es importante para Theatre manager la glucemia a un nivel saludable, en especial si utiliza insulina o toma determinados medicamentos por va oral para la diabetes. Es importante saber la cantidad de carbohidratos que se pueden ingerir en cada comida sin correr Engineer, manufacturing. Esto es Psychologist, forensic. El nutricionista puede ayudarlo a calcular la cantidad de carbohidratos que debe ingerir en cada comida y colacin. Los alimentos que contienen carbohidratos incluyen:  Pan, cereal, arroz, pasta y galletas.  Papas y maz.  Guisantes, frijoles y lentejas.  Leche y Estate agent.  Lambert Mody y [place].  Postres, como pasteles, galletitas, helado y caramelos.  Cmo me afecta el alcohol? El alcohol puede provocar disminuciones sbitas de la glucemia (hipoglucemia), en especial si utiliza insulina o toma determinados medicamentos por va oral para la diabetes. La hipoglucemia es una afeccin potencialmente mortal. Los sntomas de la hipoglucemia (somnolencia, mareos y confusin) son similares a los sntomas de haber consumido demasiado alcohol. Si el mdico afirma que el alcohol es seguro para usted, siga estas pautas:  Limite el consumo de alcohol a no ms de 1 medida por da si es mujer y no est Music therapist, y a 2 medidas si es hombre. Una medida equivale a 12oz  (327m) de cerveza, 5oz (1419m de vino o 1oz (4429mde bebidas de alta graduacin alcohlica.  No beba con el estmago vaco.  Mantngase hidratado con agua, gaseosas dietticas o t helado sin azcar.  Tenga en cuenta que las gaseosas comunes, los jugos y otros refrescos pueden contener mucha azcar y se deben contar como carbohidratos.  Consejos para seguir estPhotographers etiquetas de los alimentos  Comience por controlar el tamao de la porcin en la etiqueta. La cantidad de caloras, carbohidratos, grasas y otros nutrientes mencionados en la etiqueta se basan en una porcin del alimento. Muchos alimentos contienen ms de una porcin por envase.  Verifique la cantidad total de gramos (g) de carbohidratos totales en una porcin. Puede calcular la cantidad de porciones de carbohidratos al dividir el total de carbohidratos por 15. Por ejemplo, si un alimento posee un total de 30g de carbohidratos, equivale a 2 porciones de carbohidratos.  Verifique la cantidad de gramos (g) de grasas saturadas y grasas trans en una porcin. Escoja alimentos que no contengan grasa o que tengan  un bajo contenido.  Controle la cantidad de miligramos (mg) de sodio en una porcin. La State Farm de las personas deben limitar la ingesta de sodio total a menos de 2337m por dTraining and development officer  Siempre consulte la informacin nutricional de los alimentos etiquetados como "con bajo contenido de grasa" o "sin grasa". Estos alimentos pueden ser ms altos en azcar agregada o en carbohidratos refinados y deben evitarse.  Hable con el nutricionista para identificar sus objetivos diarios en cuanto a los nutrientes mencionados en la etiqueta. De compras  Evite comprar alimentos procesados, enlatados o prehechos. Estos alimentos tienden a tTEFL teachercantidad de gSapphire [place] sodio y azcar agregada.  Compre en la zona exterior de la tienda de comestibles. Esta incluye frutas y vNorthrop Grumman granos a granel, carnes frescas y  productos lcteos frescos. Coccin  Utilice mtodos de coccin a baja temperatura, como hornear, en lugar de mtodos de coccin a alta temperatura, como frer en abundante aceite.  Cocine con aceites saludables, como el aceite de oLoch Sheldrake canola o gWichita [place]  Evite cocinar con manteca, crema o carnes con alto contenido de grasa. Planificacin de las comidas  CInternational Papercomidas y las colaciones de forma regular, preferentemente a la misma hora todos lLindale Evite pasar largos perodos de tiempo sin comer.  Consuma alimentos ricos en fibra, como frutas frescas, verduras, frijoles y cereales integrales. Consulte al nutricionista sobre cuntas porciones de carbohidratos puede consumir en cada comida.  Consuma entre 4 y 6 onzas de protenas magras por da, como carnes [place] pollo, pescado, hFirst Data Corporationo tofu. 1 onza equivale a 1 onza de carne, pollo o pescado, 1 huevo, o 1/4 taza de tofu.  Coma algunos alimentos por da que contengan grasas saludables, como aguacates, frutos secos, semillas y pescado. Estilo de vida   Controle su nivel de glucemia con regularidad.  Haga ejercicio al menos 357mutos, 5das o ms por semana, o como se lo haya indicado el mdico.  Tome los meTenneco Ince lo haya indicado el mdico.  No consuma ningn producto que contenga nicotina o tabaco, como cigarrillos y ciPsychologist, sport and exerciseSi necesita ayuda para dejar de fumar, consulte al mdHess Corporationon un asesor o instructor en diabetes para identificar estrategias para controlar el estrs y cualquier desafo emocional y social. Cules son algunas de las preguntas que puedo hacerle a mi mdico?  Es necesario que me rena con unRadio broadcast assistantn diabetes?  Es necesario que me rena con un nutricionista?  A qu nmero puedo llamar si tengo preguntas?  Cules son los mejores momentos para controlar la glucemia? Dnde encontrar ms informacin:  Asociacin Americana de la Diabetes (American  Diabetes Association): diabetes.org/food-and-fitness/food  Academia de Nutricin y DiInformation systems managerAcademy of Nutrition and Dietetics): wwPokerClues.dkInGrand Lakeiabetes y laLecanto ReWater quality scientistNMay Street Surgi Center LLCf Diabetes and Digestive and Kidney Diseases) (InWatsontownNIH): wwContactWire.beesumen  Un plan de alimentacin saludable lo ayudar a coAeronautical engineerlucemia y maTheatre managern estilo de vida saludable.  Trabajar con un especialista en dietas y nutricin (nutricionista) puede ayudarlo a elInsurance claims handlere alimentacin para usted.  Tenga en cuenta que los carbohidratos y el alcohol tienen efectos inmediatos en sus niveles de glucemia. Es importante contar los carbohidratos y consumir alcohol con prudencia. Esta informacin no tiene coMarine scientistl consejo del mdico. Asegrese de hacerle al mdico cualquier pregunta que tenga. Document Released: 09/27/2007 Document Revised: 10/10/2016 Document Reviewed: 10/10/2016 Elsevier Interactive Patient Education  2018 ElReynolds American  Diabetes mellitus y [place] fsica (Diabetes Mellitus and Exercise) Hacer actividad fsica habitualmente es importante para el estado de salud general, en especial si tiene diabetes (diabetes mellitus). La actividad fsica no solo se reduce a Pharmacist, hospital. Aporta muchos beneficios para la salud, como aumentar la fuerza muscular y la densidad sea, y reducir las grasas corporales y Dealer. Esto mejora el estado fsico, la flexibilidad y la resistencia, y todo ello redunda en un mejor estado de salud general. La actividad fsica tiene beneficios adicionales para los diabticos, entre ellos:  Disminuye el apetito.  Ayuda a bajar y Consulting civil engineer glucemia bajo control.  Baja la presin arterial.  Ayuda a controlar las cantidades de  sustancias grasas (lpidos) en la [place], como el colesterol y los triglicridos.  Mejora la respuesta del cuerpo a la insulina (optimizacin de la sensibilidad a la insulina).  Reduce la cantidad de insulina que el cuerpo necesita.  Reduce el riesgo de sufrir cardiopata coronaria de la siguiente forma: ? Sprint Nextel Corporation de colesterol y triglicridos. ? Aumenta los niveles de colesterol bueno. ? Disminuye la glucemia. QU ES EL PLAN DE ACTIVIDADES? El mdico o un educador para la diabetes certificado pueden ayudarlo a Engineer, petroleum del tipo y de la frecuencia de actividad fsica (plan de actividades) adecuado para usted. Asegrese de lo siguiente:  Haga por lo menos 139mnutos semanales de ejercicios de intensidad moderada o vigorosa. Estos podran ser caminatas dinmicas, ciclismo o gBenin ? Haga ejercicios de elongacin y de fortalecimiento, como yoga o levantamiento de pesas, por lo menos 2veces por semana. ? Reparta la actividad en al menos 3das de la semana.  Haga algn tipo de actividad fsica tUS Airways ? No deje pasar ms de 2das seguidos sin hacer algn tipo de actividad fsica. ? No permanezca inactivo durante ms de 917mutos seguidos. Tmese descansos frecuentes para caminar o estirarse.  Elija un tipo de ejercicio o de actividad que disfrute y establezca objetivos realistas.  Comience lentamente y aumente de maMozambiqueradual la intensidad del ejercicio con el correr del tiPaoliQU DEBO SABER ACERCA DEL CONTROL DE LA DIABETES?  Contrlese la glucemia antes y despus de ejercitarse. ? Si la glucemia supera los 24038ml (13,3mm80ml) antes de ejercitarse, hgase un control de la orina para detectar la presencia de cetonas. Si tiene cetoEmerson Electricna, no se ejercite hasta que la glucemia se normalice.  Conozca los sntomas de la glucemia baja (hipoglucemia) y aprenda cmo tratarla. El riesgo de tener hipoglucemia aumeSerbiaante y  despus de hacer actividad fsica. Los sntomas frecuentes de hipoglucemia pueden incluir los siguientes: ? Hambre. ? Ansiedad. ? Sudoracin y pielIntel CorporationConfusin. ? Mareos o sensacin de desvanecimiento. ? Aumento de la frecuencia cardaca o palpitaciones. ? Visin borrosa. ? Hormigueo o adormecimiento alrededor de lExxon Mobil Corporations labios o la lengRipleyTemblores o sacudidas. ? Irritabilidad.  Tenga una colacin de carbohidratos de accin rpida disponible antes, durante y despus de ejercitarse, a fin de evitar o tratar la hipoglucemia.  Evite inyectarse insulina en las zonas del cuerpo que ejercitar. Por ejemplo, evite inyectarse insulina en: ? Los brazos, si juega al tenis. ? Las piernas, si corre.  Lleve registros de sus hbitos de actividad fsica. Esto puede ayudarlos a usted y al mdico a adapTax advisercontrol de la diabetes segn sea necesario. Escriba los siguientes datos: ? Los alimentos que consume antes y despus de haceField seismologistividad fsica. ? Los niveles de glucosa  en la sangre antes y despus de hacer ejercicios. ? El tipo y cantidad de [place] fsica que Musician. ? Cuando se prev que la insulina alcance su valor mximo, si [place] insulina. No haga actividad fsica en los momentos en que insulina alcanza su valor mximo.  Cuando comience un ejercicio o una actividad nuevos, trabaje con el mdico para asegurarse de que la actividad sea segura para usted y para Secretary/administrator la [place], los medicamentos o la ingesta de alimentos segn sea necesario.  Beba gran cantidad de agua mientras hace ejercicios para evitar la deshidratacin o los golpes de Freight forwarder. Beba suficiente lquido para Consulting civil engineer orina clara o de color amarillo plido. Esta informacin no tiene Marine scientist el consejo del mdico. Asegrese de hacerle al mdico cualquier pregunta que tenga. Document Released: 07/10/2007 Document Revised: 10/12/2015 Document Reviewed: 11/30/2015 Elsevier Interactive  Patient Education  [employer].

## 2017-08-21 NOTE — Progress Notes (Signed)
Chief Complaint  Patient presents with  . Diabetes  . Establish Care  . Numbness    in feet   . Insomnia   HPI Jacob Schneider, a 42 year old male with a history of type 2 diabetes mellitus and hypertension presents to establish care. Patient says that he has not had a primary provider due to financial and insurance constraints.  Patient primarily speaks spanish, utilizing video interpreter to assist with communication. Patient was admitted to inpatient services on 07/31/2017 in diabetic ketoacidosis. Patient has been without diabetes medications for greater than 1 year. Prior to hospital admission, patient had been drinking alcohol several times per week. Patient was discharged home on insulin NPH 70/30 on 10 units twice daily. He denies dizziness, blurred vision, headache, polyuria, polydipsia, polyphagia, or nausea.  Patient also has a history of hypertension. He does not follow a lowfat, low sodium diet or exercise routinely. Patient has been taking hydrochlorothiazide 25 mg consistently since inpatient discharge.  Past Medical History:  Diagnosis Date  . Anxiety   . Chronic lower back pain   . Diabetes mellitus type II, uncontrolled (HCC)    "I think it's type 1" (07/31/2017)  . DKA (diabetic ketoacidoses) (HCC) 07/30/2017  . History of stomach ulcers   . Hypertension    Immunization History  Administered Date(s) Administered  . Influenza,inj,Quad PF,6+ Mos 07/06/2017   Review of Systems  Constitutional: Negative.   HENT: Negative.   Eyes: Negative.   Respiratory: Negative.   Cardiovascular: Negative.   Gastrointestinal: Negative.   Genitourinary: Negative.   Musculoskeletal: Negative.   Skin: Negative.   Neurological: Negative.   Endo/Heme/Allergies: Negative.  Negative for polydipsia.  Psychiatric/Behavioral: Negative.    Physical Exam  Constitutional: He is oriented to person, place, and time. He appears well-developed and well-nourished.  HENT:  Head: Normocephalic and  atraumatic.  Right Ear: External ear normal.  Left Ear: External ear normal.  Nose: Nose normal.  Mouth/Throat: Oropharynx is clear and moist.  Eyes: Conjunctivae and EOM are normal. Pupils are equal, round, and reactive to light.  Neck: Normal range of motion. Neck supple.  Cardiovascular: Normal rate, regular rhythm, normal heart sounds and intact distal pulses.  Pulmonary/Chest: Effort normal and breath sounds normal.  Abdominal: Soft. Bowel sounds are normal.  Neurological: He is alert and oriented to person, place, and time. He has normal reflexes.  Skin: Skin is warm and dry.  Psychiatric: He has a normal mood and affect. His behavior is normal. Judgment and thought content normal.  BP 117/89 (BP Location: Right Arm, Patient Position: Sitting, Cuff Size: Large)   Pulse 98   Temp 98.4 F (36.9 C) (Oral)   Resp 14   Ht 5\' 4"  (1.626 m)   Wt 161 lb (73 kg)   SpO2 100%   BMI 27.64 kg/m   1. DM (diabetes mellitus) type 2, uncontrolled Hemoglobin a1C is 10.5, which is above goal. Goal is < 7. Will continue NPH 70-30 at 10 units BID and add Metformin 500 mg BID with meals.  Discussed the importance of starting a carbohydrate diet divided over 5-6 small meals throughout the day.  - Glucose, capillary - HgB A1c - POCT urinalysis dip (device) - metFORMIN (GLUCOPHAGE) 500 MG tablet; Take 1 tablet (500 mg total) by mouth 2 (two) times daily with a meal.  Dispense: 60 tablet; Refill: 5 - Lipid Panel - lisinopril (PRINIVIL,ZESTRIL) 2.5 MG tablet; Take 1 tablet (2.5 mg total) by mouth daily.  Dispense: 30 tablet; Refill:  5  2. Essential hypertension Blood pressure is at goal on current medication regimen Will continue hydrochlorothiazide 25 mg daily Reviewed urinalysis, mild proteinuria present. Will add Lisinopril 2.5 mg daily - Continue medication, monitor blood pressure at home. Continue DASH diet. Reminder to go to the ER if any CP, SOB, nausea, dizziness, severe HA, changes  vision/speech, left arm numbness and tingling and jaw pain.     - POCT urinalysis dip (device) - Lipid Panel - hydrochlorothiazide (HYDRODIURIL) 25 MG tablet; Take 1 tablet (25 mg total) by mouth daily.  Dispense: 30 tablet; Refill: 5 - lisinopril (PRINIVIL,ZESTRIL) 2.5 MG tablet; Take 1 tablet (2.5 mg total) by mouth daily.  Dispense: 30 tablet; Refill: 5  3. Neuropathy - gabapentin (NEURONTIN) 100 MG capsule; Take 1 capsule (100 mg total) by mouth 3 (three) times daily.  Dispense: 90 capsule; Refill: 1  4. Need for Tdap vaccination - Tdap vaccine greater than or equal to 7yo IM  5. Immunization due - Pneumococcal polysaccharide vaccine 23-valent greater than or equal to 2yo subcutaneous/IM  6. Language barrier to communication  Patient primarily speaks spanish, used video interpreter to assist with communication   RTC: 2 months for DMII   Nolon Nations  MSN, FNP-C [facility] [address] [place], [place] [address] [phone]

## 2017-08-22 ENCOUNTER — Telehealth: Payer: Self-pay

## 2017-08-22 ENCOUNTER — Other Ambulatory Visit: Payer: Self-pay | Admitting: Family Medicine

## 2017-08-22 DIAGNOSIS — E785 Hyperlipidemia, unspecified: Secondary | ICD-10-CM | POA: Insufficient documentation

## 2017-08-22 LAB — LIPID PANEL
Chol/HDL Ratio: 4.3 ratio (ref 0.0–5.0)
Cholesterol, Total: 141 mg/dL (ref 100–199)
HDL: 33 mg/dL — AB (ref >39)
LDL CALC: 77 mg/dL (ref 0–99)
Triglycerides: 154 mg/dL — ABNORMAL HIGH (ref 0–149)
VLDL CHOLESTEROL CAL: 31 mg/dL (ref 5–40)

## 2017-08-22 MED ORDER — ATORVASTATIN CALCIUM 20 MG PO TABS
20.0000 mg | ORAL_TABLET | Freq: Every day | ORAL | 3 refills | Status: DC
Start: 2017-08-22 — End: 2018-08-27

## 2017-08-22 MED ORDER — ASPIRIN EC 81 MG PO TBEC: 81.0000 mg | DELAYED_RELEASE_TABLET | Freq: Every day | ORAL | 11 refills | Status: DC

## 2017-08-22 MED FILL — ATORVASTATIN 20 MG TABLET: 20 | 30 days supply | Qty: 30 | Fill #0

## 2017-08-22 NOTE — Progress Notes (Signed)
Meds ordered this encounter  Medications  . atorvastatin (LIPITOR) 20 MG tablet    Sig: Take 1 tablet (20 mg total) by mouth daily.    Dispense:  90 tablet    Refill:  3  . aspirin EC 81 MG tablet    Sig: Take 1 tablet (81 mg total) by mouth daily.    Dispense:  30 tablet    Refill:  11    Nolon NationsLachina Moore Phillp Dolores  MSN, FNP-C Patient [facility] [address]  [place], [place] [id number] [phone]

## 2017-08-22 NOTE — Telephone Encounter (Signed)
-----   Message from Massie MaroonLachina M Hollis, [place]NP sent at 08/22/2017  6:08 AM EST ----- Regarding: lab results Please inform patient that triglycerides are elevated, will start a trial of Atorvastatin 20 mg every evening with dinner. Will also start Aspirin as a stroke preventative. Recommend a lowfat, low carbohydrate diet divided over 5-6 small meals, increase water intake to 6-8 glasses, and 150 minutes per week of cardiovascular exercise.    Thanks

## 2017-08-22 NOTE — Telephone Encounter (Signed)
Called using Language Resources ID [phone]. NO answer, interpreter left a message for patient to call back regarding labs. Thanks!

## 2017-08-24 DIAGNOSIS — I10 Essential (primary) hypertension: Secondary | ICD-10-CM | POA: Insufficient documentation

## 2017-08-24 DIAGNOSIS — G629 Polyneuropathy, unspecified: Secondary | ICD-10-CM | POA: Insufficient documentation

## 2017-10-19 ENCOUNTER — Ambulatory Visit: Payer: Self-pay | Admitting: Family Medicine

## 2017-11-30 ENCOUNTER — Ambulatory Visit: Payer: Self-pay | Admitting: Family Medicine

## 2017-12-01 ENCOUNTER — Emergency Department (HOSPITAL_COMMUNITY): Payer: Self-pay

## 2017-12-01 DIAGNOSIS — I10 Essential (primary) hypertension: Secondary | ICD-10-CM | POA: Insufficient documentation

## 2017-12-01 DIAGNOSIS — R079 Chest pain, unspecified: Secondary | ICD-10-CM | POA: Insufficient documentation

## 2017-12-01 DIAGNOSIS — Z79899 Other long term (current) drug therapy: Secondary | ICD-10-CM | POA: Insufficient documentation

## 2017-12-01 DIAGNOSIS — E119 Type 2 diabetes mellitus without complications: Secondary | ICD-10-CM | POA: Insufficient documentation

## 2017-12-01 DIAGNOSIS — R0602 Shortness of breath: Secondary | ICD-10-CM | POA: Insufficient documentation

## 2017-12-01 DIAGNOSIS — Z7982 Long term (current) use of aspirin: Secondary | ICD-10-CM | POA: Insufficient documentation

## 2017-12-01 DIAGNOSIS — Z87891 Personal history of nicotine dependence: Secondary | ICD-10-CM | POA: Insufficient documentation

## 2017-12-01 DIAGNOSIS — Z794 Long term (current) use of insulin: Secondary | ICD-10-CM | POA: Insufficient documentation

## 2017-12-01 LAB — BASIC METABOLIC PANEL
Anion gap: 11 (ref 5–15)
BUN: 14 mg/dL (ref 6–20)
CALCIUM: 9.2 mg/dL (ref 8.9–10.3)
CHLORIDE: 95 mmol/L — AB (ref 101–111)
CO2: 29 mmol/L (ref 22–32)
CREATININE: 0.88 mg/dL (ref 0.61–1.24)
GFR calc non Af Amer: >60 mL/min (ref >60)
Glucose, Bld: 132 mg/dL — ABNORMAL HIGH (ref 65–99)
Potassium: 3.4 mmol/L — ABNORMAL LOW (ref 3.5–5.1)
SODIUM: 135 mmol/L (ref 135–145)

## 2017-12-01 LAB — I-STAT TROPONIN, ED: TROPONIN I, POC: 0.01 ng/mL (ref 0.00–0.08)

## 2017-12-01 LAB — CBC
HCT: 41.4 % (ref 39.0–52.0)
Hemoglobin: 14.3 g/dL (ref 13.0–17.0)
MCH: 26.7 pg (ref 26.0–34.0)
MCHC: 34.5 g/dL (ref 30.0–36.0)
MCV: 77.4 fL — AB (ref 78.0–100.0)
PLATELETS: 320 10*3/uL (ref 150–400)
RBC: 5.35 MIL/uL (ref 4.22–5.81)
RDW: 13.6 % (ref 11.5–15.5)
WBC: 12.2 10*3/uL — ABNORMAL HIGH (ref 4.0–10.5)

## 2017-12-02 ENCOUNTER — Encounter (HOSPITAL_COMMUNITY): Payer: Self-pay | Admitting: Emergency Medicine

## 2017-12-02 ENCOUNTER — Emergency Department (HOSPITAL_COMMUNITY)
Admission: EM | Admit: 2017-12-02 | Discharge: 2017-12-02 | Disposition: A | Discharge status: Home / Self Care | Payer: Self-pay | Attending: Emergency Medicine | Admitting: Emergency Medicine

## 2017-12-02 ENCOUNTER — Other Ambulatory Visit: Payer: Self-pay

## 2017-12-02 DIAGNOSIS — R079 Chest pain, unspecified: Secondary | ICD-10-CM

## 2017-12-02 DIAGNOSIS — R0602 Shortness of breath: Secondary | ICD-10-CM

## 2017-12-02 LAB — D-DIMER, QUANTITATIVE: D-Dimer, Quant: <0.27 ug/mL-FEU (ref 0.00–0.50)

## 2017-12-02 LAB — I-STAT TROPONIN, ED: TROPONIN I, POC: 0.02 ng/mL (ref 0.00–0.08)

## 2017-12-02 NOTE — ED Provider Notes (Signed)
[facility] DEPT Provider Note   CSN: [id number] Arrival date & time: 12/01/17  2234     History   Chief Complaint Chief Complaint  Patient presents with  . Chest Pain  . Shortness of Breath    HPI Jacob Schneider is a 42 y.o. male.  Patient presents to the emergency department with a chief complaint of chest pain shortness of breath.  He states that the symptoms have been ongoing today.  He reports some bilateral calf pain.  He denies any fever, chills, or productive cough.  He denies any recent long travel or surgery.  He denies any history of PE or DVT.  He states that he is out of his regular medications.  He missed his primary care appointment this week.  The history is provided by the patient. No language interpreter was used.    Past Medical History:  Diagnosis Date  . Anxiety   . Chronic lower back pain   . Diabetes mellitus type II, uncontrolled ([place])    "I think it's type 1" (07/31/2017)  . DKA (diabetic ketoacidoses) ([place]) 07/30/2017  . History of stomach ulcers   . Hypertension     Patient Active Problem List   Diagnosis Date Noted  . Essential hypertension 08/24/2017  . Neuropathy 08/24/2017  . Hyperlipidemia 08/22/2017  . Noncompliance with medication regimen 08/04/2017  . Alcohol withdrawal syndrome, with delirium ([place]) 07/01/2017  . DKA (diabetic ketoacidoses) ([place]) 07/01/2017  . AKI (acute kidney injury) ([place]) 07/01/2017  . Alcoholic gastritis   . Costochondritis 04/06/2015  . DM (diabetes mellitus) type 2, uncontrolled 04/01/2015  . Hypokalemia 04/01/2015  . Hypomagnesemia 04/01/2015  . Alcohol withdrawal delirium, acute, hyperactive ([place]) 03/30/2015    Past Surgical History:  Procedure Laterality Date  . BACK SURGERY    . [place] SURGERY  2004        Home Medications    Prior to Admission medications   Medication Sig Start Date End Date Taking? Authorizing Provider  aspirin EC 81 MG tablet Take 1  tablet (81 mg total) by mouth daily. 08/22/17   Dorena Dew, FNP  atorvastatin (LIPITOR) 20 MG tablet Take 1 tablet (20 mg total) by mouth daily. 08/22/17   Dorena Dew, FNP  blood glucose meter kit and supplies KIT Dispense based on patient and insurance preference. Use up to four times daily as directed. (FOR ICD-9 250.00, 250.01). 08/04/17   Phillips Grout, MD  gabapentin (NEURONTIN) 100 MG capsule Take 1 capsule (100 mg total) by mouth 3 (three) times daily. 08/21/17   Dorena Dew, FNP  hydrochlorothiazide (HYDRODIURIL) 25 MG tablet Take 1 tablet (25 mg total) by mouth daily. 08/21/17   Dorena Dew, FNP  insulin NPH-regular Human (NOVOLIN 70/30) (70-30) 100 UNIT/ML injection Inject 10 Units into the skin 2 (two) times daily with a meal. 08/04/17   Phillips Grout, MD  Insulin Syringes, Disposable, U-100 0.5 ML MISC Take insulin as directed 08/04/17   Phillips Grout, MD  lisinopril (PRINIVIL,ZESTRIL) 2.5 MG tablet Take 1 tablet (2.5 mg total) by mouth daily. 08/21/17   Dorena Dew, FNP  metFORMIN (GLUCOPHAGE) 500 MG tablet Take 1 tablet (500 mg total) by mouth 2 (two) times daily with a meal. 08/21/17   Dorena Dew, FNP    Family History Family History  Problem Relation Age of Onset  . Diabetes Father     Social History Social History   Tobacco Use  .  Smoking status: Former Smoker    Packs/day: 0.10    Years: 28.00    Pack years: 2.80    Types: Cigarettes  . Smokeless tobacco: Never Used  . Tobacco comment: 07/31/2017 "stopped before Thanksgiving"  Substance Use Topics  . Alcohol use: Yes    Alcohol/week: 16.8 oz    Types: 28 Cans of beer per week    Comment: 07/31/2017 "4 beers/day on average"  . Drug use: Yes    Types: Cocaine    Comment: 07/31/2017 "none since ~ 05/2017"     Allergies   Patient has no known allergies.   Review of Systems Review of Systems  All other systems reviewed and are negative.    Physical Exam Updated Vital  Signs BP (!) 166/112 (BP Location: Left Arm)   Pulse 92   Temp 99.1 F (37.3 C) (Oral)   Resp 18   Ht 5' 4"  (1.626 m)   Wt 81.6 kg (180 lb)   SpO2 100%   BMI 30.90 kg/m   Physical Exam  Constitutional: He is oriented to person, place, and time. He appears well-developed and well-nourished.  HENT:  Head: Normocephalic and atraumatic.  Eyes: Pupils are equal, round, and reactive to light. Conjunctivae and EOM are normal. Right eye exhibits no discharge. Left eye exhibits no discharge. No scleral icterus.  Neck: Normal range of motion. Neck supple. No JVD present.  Cardiovascular: Normal rate, regular rhythm and normal heart sounds. Exam reveals no gallop and no friction rub.  No murmur heard. Pulmonary/Chest: Effort normal and breath sounds normal. No respiratory distress. He has no wheezes. He has no rales. He exhibits no tenderness.  Clear to auscultation bilaterally  Abdominal: Soft. He exhibits no distension and no mass. There is no tenderness. There is no rebound and no guarding.  Musculoskeletal: Normal range of motion. He exhibits no edema or tenderness.  Tenderness to bilateral calves, no erythema  Neurological: He is alert and oriented to person, place, and time.  Skin: Skin is warm and dry.  Psychiatric: He has a normal mood and affect. His behavior is normal. Judgment and thought content normal.  Nursing note and vitals reviewed.    ED Treatments / Results  Labs (all labs ordered are listed, but only abnormal results are displayed) Labs Reviewed  BASIC METABOLIC PANEL - Abnormal; Notable for the following components:      Result Value   Potassium 3.4 (*)    Chloride 95 (*)    Glucose, Bld 132 (*)    All other components within normal limits  CBC - Abnormal; Notable for the following components:   WBC 12.2 (*)    MCV 77.4 (*)    All other components within normal limits  D-DIMER, QUANTITATIVE (NOT AT [facility])  I-STAT TROPONIN, ED  I-STAT TROPONIN, ED     EKG EKG Interpretation  Date/Time:  Friday Dec 01 2017 22:58:32 EDT Ventricular Rate:  107 PR Interval:    QRS Duration: 75 QT Interval:  347 QTC Calculation: 463 R Axis:   0 Text Interpretation:  Sinus tachycardia Baseline wander in lead(s) V1 V3 Confirmed by Dory Horn) on 12/02/2017 4:12:32 AM   Radiology Dg Chest 2 View  Result Date: 12/01/2017 CLINICAL DATA:  Left-sided chest pain. EXAM: CHEST - 2 VIEW COMPARISON:  August 01, 2017 FINDINGS: The heart size and mediastinal contours are within normal limits. Both lungs are clear. The visualized skeletal structures are unremarkable. IMPRESSION: No active cardiopulmonary disease. Electronically Signed   By:  Dorise Bullion III M.D   On: 12/01/2017 23:57    Procedures Procedures (including critical care time)  Medications Ordered in ED Medications - No data to display   Initial Impression / Assessment and Plan / ED Course  I have reviewed the triage vital signs and the nursing notes.  Pertinent labs & imaging results that were available during my care of the patient were reviewed by me and considered in my medical decision making (see chart for details).     Patient with chest pain shortness of breath since yesterday.  He has bilateral calf tenderness.  He is afebrile.  His lung sounds are clear to auscultation.  He is tachycardic.  Will check d-dimer.  Troponin is negative.  Heart score is 3.  Doubt ACS.  Will recheck troponin.  D-dimer is negative, doubt PE.  Patient is non-toxic appearing.  DC to home.  Final Clinical Impressions(s) / ED Diagnoses   Final diagnoses:  Chest pain, unspecified type  Shortness of breath    ED Discharge Orders    None       Montine Circle, PA-C 12/02/17 [id number]    Palumbo, April, MD 12/02/17 1388

## 2017-12-02 NOTE — ED Triage Notes (Signed)
Patient is complaining of mid chest pain, upper abdominal pain, and could not sleep. Patient has a hx of diabetes. Patient is complaining of sob also but does not have it at this time. Patient has ran out of his metformin.

## 2018-08-27 ENCOUNTER — Encounter: Payer: Self-pay | Admitting: Family Medicine

## 2018-08-27 ENCOUNTER — Ambulatory Visit (INDEPENDENT_AMBULATORY_CARE_PROVIDER_SITE_OTHER): Payer: Self-pay | Admitting: Family Medicine

## 2018-08-27 VITALS — BP 144/90 | HR 101 | Temp 99.0°F | Resp 16 | Ht 64.0 in | Wt 198.0 lb

## 2018-08-27 DIAGNOSIS — I10 Essential (primary) hypertension: Secondary | ICD-10-CM

## 2018-08-27 DIAGNOSIS — Z23 Encounter for immunization: Secondary | ICD-10-CM

## 2018-08-27 DIAGNOSIS — G629 Polyneuropathy, unspecified: Secondary | ICD-10-CM

## 2018-08-27 DIAGNOSIS — E1110 Type 2 diabetes mellitus with ketoacidosis without coma: Secondary | ICD-10-CM

## 2018-08-27 DIAGNOSIS — E785 Hyperlipidemia, unspecified: Secondary | ICD-10-CM

## 2018-08-27 LAB — POCT GLYCOSYLATED HEMOGLOBIN (HGB A1C): Hemoglobin A1C: 9.7 % — AB (ref 4.0–5.6)

## 2018-08-27 LAB — POCT URINALYSIS DIPSTICK
Bilirubin, UA: NEGATIVE
Blood, UA: NEGATIVE
Glucose, UA: POSITIVE — AB
Ketones, UA: NEGATIVE
Leukocytes, UA: NEGATIVE
Nitrite, UA: NEGATIVE
Protein, UA: NEGATIVE
Spec Grav, UA: 1.025 (ref 1.010–1.025)
Urobilinogen, UA: 0.2 E.U./dL
pH, UA: 6 (ref 5.0–8.0)

## 2018-08-27 MED ORDER — GABAPENTIN 100 MG PO CAPS: 100.0000 mg | ORAL_CAPSULE | Freq: Three times a day (TID) | ORAL | 1 refills | Status: DC

## 2018-08-27 MED ORDER — LISINOPRIL 2.5 MG PO TABS: 2.5000 mg | ORAL_TABLET | Freq: Every day | ORAL | 3 refills | Status: DC

## 2018-08-27 MED ORDER — METFORMIN HCL 500 MG PO TABS: 500.0000 mg | ORAL_TABLET | Freq: Two times a day (BID) | ORAL | 3 refills | Status: DC

## 2018-08-27 MED ORDER — BLOOD GLUCOSE MONITOR KIT: PACK | 0 refills | Status: AC

## 2018-08-27 MED ORDER — GLIMEPIRIDE 4 MG PO TABS: 4.0000 mg | ORAL_TABLET | Freq: Every day | ORAL | 3 refills | Status: DC

## 2018-08-27 MED ORDER — HYDROCHLOROTHIAZIDE 25 MG PO TABS: 25.0000 mg | ORAL_TABLET | Freq: Every day | ORAL | 3 refills | Status: DC

## 2018-08-27 MED ORDER — ATORVASTATIN CALCIUM 20 MG PO TABS: 20.0000 mg | ORAL_TABLET | Freq: Every day | ORAL | 3 refills | Status: DC

## 2018-08-27 MED FILL — ATORVASTATIN 20 MG TABLET: 20 | 30 days supply | Qty: 30 | Fill #0

## 2018-08-27 MED FILL — GLIMEPIRIDE 4 MG TABS: 4 | 30 days supply | Qty: 30 | Fill #0

## 2018-08-27 MED FILL — metFORMIN HCL 500 MG TABS: 500 | 30 days supply | Qty: 60 | Fill #0

## 2018-08-27 MED FILL — LISINOPRIL 2.5 MG TABLET: 2.5 | 30 days supply | Qty: 30 | Fill #0

## 2018-08-27 MED FILL — HYDROCHLOROTHIAZIDE 25 MG T: 25 | 30 days supply | Qty: 30 | Fill #0

## 2018-08-27 MED FILL — GABAPENTIN 100 MG CAP: 100 | 30 days supply | Qty: 90 | Fill #0

## 2018-08-27 NOTE — Patient Instructions (Signed)
La diabetes mellitus y las normas bsicas de atencin mdica  Diabetes Mellitus and Standards of Medical Care  Tratar la diabetes (diabetes mellitus) puede ser complicado. Su tratamiento de la diabetes puede ser administrado por un equipo de profesionales de la salud, que incluye:   Un mdico especializado en diabetes (endocrinlogo).   Un enfermero especializado o auxiliar mdico.   Enfermeros.   Un especialista en dietas y nutricin (nutricionista registrado).   Un educador certificado para el cuidado de la diabetes.   Un especialista en actividad fsica.   Un farmacutico.   Un oculista.   Un especialista en pies (podlogo).   Un dentista.   Un mdico de cabecera.   Un profesional de salud mental.  Los mdicos siguen pautas para ayudarlo a obtener la mejor calidad de atencin. El programa siguiente es una gua general para su plan de control de la diabetes. Los mdicos tambin podrn darle instrucciones ms especficas.  Exmenes fsicos  Tras ser diagnosticado con diabetes mellitus, y cada ao luego de esto, su mdico le preguntar acerca de sus antecedentes mdicos y familiares. Tambin har un examen fsico. Este examen puede incluir:   Medicin de la estatura, peso e ndice de masa corporal (IMC).   Control de la presin arterial. Esto se realiza en cada visita mdica de rutina. La presin arterial deseada puede variar en funcin de sus enfermedades, edad y otros factores.   Examen de la glndula tiroidea.   Examen de la piel.   Examen para deteccin de dao en los nervios (neuropata perifrica). Esto puede incluir controlar el pulso de las piernas y los pies, y el nivel de sensibilidad en las manos y pies.   Un examen de pies completo para inspeccionar la estructura y la piel de los pies, lo que incluye controlar si hay cortes, hematomas, enrojecimiento, ampollas, llagas u otros problemas.   Examen de deteccin de problemas con los vasos sanguneos (vasculares), lo que puede incluir  controlar el pulso de las piernas y los pies y la temperatura.  Anlisis de sangre.  Segn el plan de tratamiento y de las necesidades personales, es posible que se le realicen las siguientes pruebas:   Anlisis de HbA1c (hemoglobina A1c). Este anlisis proporciona informacin sobre el control de la glucemia (glucosa en la sangre) durante los ltimos 2 o 3meses. Se usa para ajustar el plan de tratamiento, de ser necesario. Este anlisis se har:  ? Al menos 2 veces al ao si cumple los objetivos del tratamiento.  ? Cuatro veces al ao, si no cumple los objetivos del tratamiento o si sus objetivos del tratamiento han cambiado.   Anlisis de lpidos, lo que incluye colesterol LDL y HDL y niveles de triglicridos.  ? En relacin al LDL, el objetivo es tener menos de 100mg/dl (5,5 mmol/l). Si tiene alto riesgo de complicaciones, el objetivo es tener menos de 70 mg/dl (3.9 mmol/l).  ? En relacin al HDL, el objetivo es tener 40 mg/dl (2.2 mmol/l) o ms para los hombres y 50 mg/dl (2.8 mmol/l) o ms para las mujeres. Un nivel de colesterol HDL de 60 mg/dl (3.3mmol/l) o superior da una cierta proteccin contra la enfermedad cardaca.  ? En relacin a los triglicridos, el objetivo es tener menos de 150 mg/dl (8,3 mmol/l).   Pruebas funcionales hepticas.   Pruebas de la funcin renal.   Pruebas de la funcin tiroidea.  Exmenes dentales y oculares   Visite al dentista dos veces por ao.   Si tiene diabetes tipo1,   el mdico puede recomendarle que se haga un examen ocular 3 a 5aos despus del diagnstico y, luego, una vez al ao despus del primer examen.  ? Para los nios que tienen diabetes tipo1, el pediatra puede recomendar un examen ocular cuando el nio tiene 10aos o ms y ha tenido diabetes durante 3 a 5 aos. Despus del primer examen, el nio debe hacerse un examen ocular una vez al ao.   Si tiene diabetes tipo2, el mdico puede recomendarle que se haga un examen ocular ni bien haya sido  diagnosticado y, luego, una vez al ao despus del primer examen.  Vacunas     Se recomienda aplicar de forma anual la vacuna contra la gripe (influenza) a todas las personas de 6meses en adelante que tengan diabetes.   La vacuna contra la neumona (antineumoccica) est recomendada para todas las personas de 2aos en adelante que tengan diabetes. Si tiene 65aos o ms, puede recibir la vacuna antineumoccica como una serie de dos inyecciones diferentes.   Se recomienda administrar la vacuna contra la hepatitisB en adultos poco despus de que hayan recibido el diagnstico de diabetes.   Los adultos y los nios que tienen diabetes deben recibir todas las vacunas de acuerdo con las recomendaciones especficas segn la edad de los Centros para el Control y la Prevencin de Enfermedades (Centers for Disease Control and Prevention, CDC).  Salud mental y emocional  Se recomienda realizar controles para detectar sntomas de trastornos de la alimentacin, ansiedad y depresin en el momento del diagnstico, y en una etapa posterior segn sea necesario. Si los controles revelan la presencia de sntomas (resultado positivo de los controles), es posible que deba someterse a ms evaluaciones y trabajar con un profesional de salud mental.  Plan de tratamiento  Su plan de tratamiento ser revisado en cada visita mdica. Usted y el mdico analizarn lo siguiente:   Cmo est recibiendo los medicamentos, incluso la insulina.   Cualquier efectos secundarios que est experimentando.   Sus objetivos deseados con relacin a la glucemia.   La frecuencia de su control de glucemia.   Hbitos del estilo de vida, como nivel de actividad as como consumo de tabaco, alcohol y drogas.  Educacin para el autocontrol de la diabetes  El mdico evaluar qu tan bien se encuentran los niveles de glucemia y si est recibiendo la cantidad correcta de insulina. El mdico puede derivarlo a:   Un especialista en educacin para la diabetes  certificado para manejar la diabetes durante toda la vida, comenzando desde el diagnstico.   Un nutricionista matriculado que puede crear o revisar un plan de nutricin personal.   Un especialista en ejercicios que puede analizar el nivel de actividad y un plan de ejercicios.  Resumen   Tratar la diabetes (diabetes mellitus) puede ser complicado. Su tratamiento de la diabetes puede ser administrado por un equipo de profesionales de la salud.   Los mdicos siguen una gua para ayudarlo a obtener la mejor calidad de atencin.   Las normas asistenciales incluyen realizarse, regularmente, exmenes fsicos, anlisis de sangre y controles de la presin arterial, aplicarse vacunas, someterse a estudios de control e informarse sobre cmo controlar su diabetes.   Sus mdicos tambin podrn darle instrucciones ms especficas basndose en su salud.  Esta informacin no tiene como fin reemplazar el consejo del mdico. Asegrese de hacerle al mdico cualquier pregunta que tenga.  Document Released: 09/14/2009 Document Revised: 04/17/2017 Document Reviewed: 11/20/2012  Elsevier Interactive Patient Education  2019 Elsevier Inc.

## 2018-08-27 NOTE — Progress Notes (Signed)
Patient [facility]  New Patient Encounter Provider: Lanae Boast, [place]    SPQ:[id number]  JFH:[id number]  DOB - 1976-04-05  SUBJECTIVE:   Jacob Schneider, is a 43 y.o. male who presents to re-establish care with this clinic.   Current problems/concerns:   Patient states that he has been out of his insulin and metformin for the past 8 months. He states that he is having numbness in his feet x 1 year. He denies a carb modified diet or regular exercise.   No Known Allergies Past Medical History:  Diagnosis Date  . Anxiety   . Chronic lower back pain   . Diabetes mellitus type II, uncontrolled ([place])    "I think it's type 1" (07/31/2017)  . DKA (diabetic ketoacidoses) ([place]) 07/30/2017  . History of stomach ulcers   . Hypertension    Current Outpatient Medications on File Prior to Visit  Medication Sig Dispense Refill  . aspirin EC 81 MG tablet Take 1 tablet (81 mg total) by mouth daily. (Patient not taking: Reported on 08/27/2018) 30 tablet 11  . insulin NPH-regular Human (NOVOLIN 70/30) (70-30) 100 UNIT/ML injection Inject 10 Units into the skin 2 (two) times daily with a meal. (Patient not taking: Reported on 08/27/2018) 10 mL 0  . Insulin Syringes, Disposable, U-100 0.5 ML MISC Take insulin as directed (Patient not taking: Reported on 08/27/2018) 100 each 1   No current facility-administered medications on file prior to visit.    Family History  Problem Relation Age of Onset  . Diabetes Father    Social History   Socioeconomic History  . Marital status: Single    Spouse name: Not on file  . Number of children: Not on file  . Years of education: Not on file  . Highest education level: Not on file  Occupational History  . Not on file  Social Needs  . Financial resource strain: Not on file  . Food insecurity:    Worry: Not on file    Inability: Not on file  . Transportation needs:    Medical: Not on file    Non-medical: Not on  file  Tobacco Use  . Smoking status: Former Smoker    Packs/day: 0.10    Years: 28.00    Pack years: 2.80    Types: Cigarettes  . Smokeless tobacco: Never Used  . Tobacco comment: 07/31/2017 "stopped before Thanksgiving"  Substance and Sexual Activity  . Alcohol use: Yes    Alcohol/week: 28.0 standard drinks    Types: 28 Cans of beer per week    Comment: 07/31/2017 "4 beers/day on average"  . Drug use: Not Currently    Types: Cocaine    Comment: 07/31/2017 "none since ~ 05/2017"  . Sexual activity: Yes    Birth control/protection: None  Lifestyle  . Physical activity:    Days per week: Not on file    Minutes per session: Not on file  . Stress: Not on file  Relationships  . Social connections:    Talks on phone: Not on file    Gets together: Not on file    Attends religious service: Not on file    Active member of club or organization: Not on file    Attends meetings of clubs or organizations: Not on file    Relationship status: Not on file  . Intimate partner violence:    Fear of current or ex partner: Not on file    Emotionally abused: Not on  file    Physically abused: Not on file    Forced sexual activity: Not on file  Other Topics Concern  . Not on file  Social History Narrative  . Not on file    Review of Systems  Constitutional: Negative.   HENT: Negative.   Eyes: Negative.   Respiratory: Negative.   Cardiovascular: Negative.   Gastrointestinal: Negative.   Genitourinary: Negative.   Musculoskeletal: Negative.   Skin: Negative.   Neurological: Positive for tingling.  Psychiatric/Behavioral: Negative.      OBJECTIVE:    BP (!) 144/90 Comment: manually  Pulse (!) 101   Temp 99 F (37.2 C) (Oral)   Resp 16   Ht 5' 4"  (1.626 m)   Wt 198 lb (89.8 kg)   SpO2 99%   BMI 33.99 kg/m   Physical Exam  Constitutional: He is oriented to person, place, and time and well-developed, well-nourished, and in no distress. No distress.  HENT:  Head: Normocephalic  and atraumatic.  Eyes: Pupils are equal, round, and reactive to light. Conjunctivae and EOM are normal.  Neck: Normal range of motion. Neck supple.  Cardiovascular: Normal rate, regular rhythm and intact distal pulses. Exam reveals no gallop and no friction rub.  No murmur heard. Pulmonary/Chest: Effort normal and breath sounds normal. No respiratory distress. He has no wheezes.  Abdominal: Soft. Bowel sounds are normal. There is no abdominal tenderness.  Musculoskeletal: Normal range of motion.        General: No tenderness or edema.  Lymphadenopathy:    He has no cervical adenopathy.  Neurological: He is alert and oriented to person, place, and time. Gait normal.  Skin: Skin is warm and dry.  Psychiatric: Mood, memory, affect and judgment normal.  Nursing note and vitals reviewed.   Diabetic Foot Exam - Simple   Simple Foot Form Diabetic Foot exam was performed with the following findings:  Yes 08/27/2018  3:29 PM  Visual Inspection See comments:  Yes Sensation Testing Intact to touch and monofilament testing bilaterally:  Yes Pulse Check Posterior Tibialis and Dorsalis pulse intact bilaterally:  Yes Comments Thick, discolored toenails on bilateral feet.     ASSESSMENT/PLAN:  1. Essential hypertension - Urinalysis Dipstick - hydrochlorothiazide (HYDRODIURIL) 25 MG tablet; Take 1 tablet (25 mg total) by mouth daily.  Dispense: 90 tablet; Refill: 3 - lisinopril (PRINIVIL,ZESTRIL) 2.5 MG tablet; Take 1 tablet (2.5 mg total) by mouth daily.  Dispense: 90 tablet; Refill: 3 - CBC With Differential; Future - Comprehensive metabolic panel; Future - Lipid Panel With LDL/HDL Ratio; Future - TSH; Future  2. DM (diabetes mellitus) type 2, uncontrolled - HgB A1c - metFORMIN (GLUCOPHAGE) 500 MG tablet; Take 1 tablet (500 mg total) by mouth 2 (two) times daily with a meal.  Dispense: 180 tablet; Refill: 3 - lisinopril (PRINIVIL,ZESTRIL) 2.5 MG tablet; Take 1 tablet (2.5 mg total) by  mouth daily.  Dispense: 90 tablet; Refill: 3 - glimepiride (AMARYL) 4 MG tablet; Take 1 tablet (4 mg total) by mouth daily before breakfast.  Dispense: 90 tablet; Refill: 3 - blood glucose meter kit and supplies KIT; Dispense based on patient and insurance preference. Daily in AM  Dispense: 1 each; Refill: 0 - HM Diabetes Foot Exam  3. Hyperlipidemia, unspecified hyperlipidemia type - atorvastatin (LIPITOR) 20 MG tablet; Take 1 tablet (20 mg total) by mouth daily.  Dispense: 90 tablet; Refill: 3  4. Neuropathy - gabapentin (NEURONTIN) 100 MG capsule; Take 1 capsule (100 mg total) by mouth 3 (three) times  daily.  Dispense: 90 capsule; Refill: 1 - VITAMIN D 25 Hydroxy (Vit-D Deficiency, Fractures); Future   Discussed the importance of being compliant with medications. Patient is to call if he has problems with new medications. He needs to return prior to his next appt for fasting labs. All medications refilled.   Return in about 3 months (around 11/25/2018), or 1 week for fasting blood work .  The patient was given clear instructions to go to ER or return to medical center if symptoms don't improve, worsen or new problems develop. The patient verbalized understanding. The patient was told to call to get lab results if they haven't heard anything in the next week.     This note has been created with Surveyor, quantity. Any transcriptional errors are unintentional.   Ms. Andr L. Nathaneil Canary, FNP-BC [facility] [address] [place], [place] [address] [phone]

## 2018-09-03 ENCOUNTER — Other Ambulatory Visit: Payer: Self-pay

## 2018-11-27 ENCOUNTER — Telehealth: Payer: Self-pay

## 2018-11-27 NOTE — Telephone Encounter (Signed)
Called using Language Resources [phone].Called to do COVID Screening for appointment tomorrow. No answer. Left a message to call back. Thanks!

## 2018-11-28 ENCOUNTER — Ambulatory Visit: Payer: Self-pay | Admitting: Family Medicine

## 2019-03-13 ENCOUNTER — Encounter (HOSPITAL_COMMUNITY): Payer: Self-pay

## 2019-03-13 ENCOUNTER — Encounter (HOSPITAL_COMMUNITY): Payer: Self-pay | Admitting: *Deleted

## 2019-03-20 IMAGING — DX DG CHEST 2V
2 series · 2 of 2 positions shown · non-contrast
Comparison: 03/31/2015

CLINICAL DATA: Severe chest pain, fever, and cough for 1 week.

EXAM:
CHEST  2 VIEW

[chest lat]
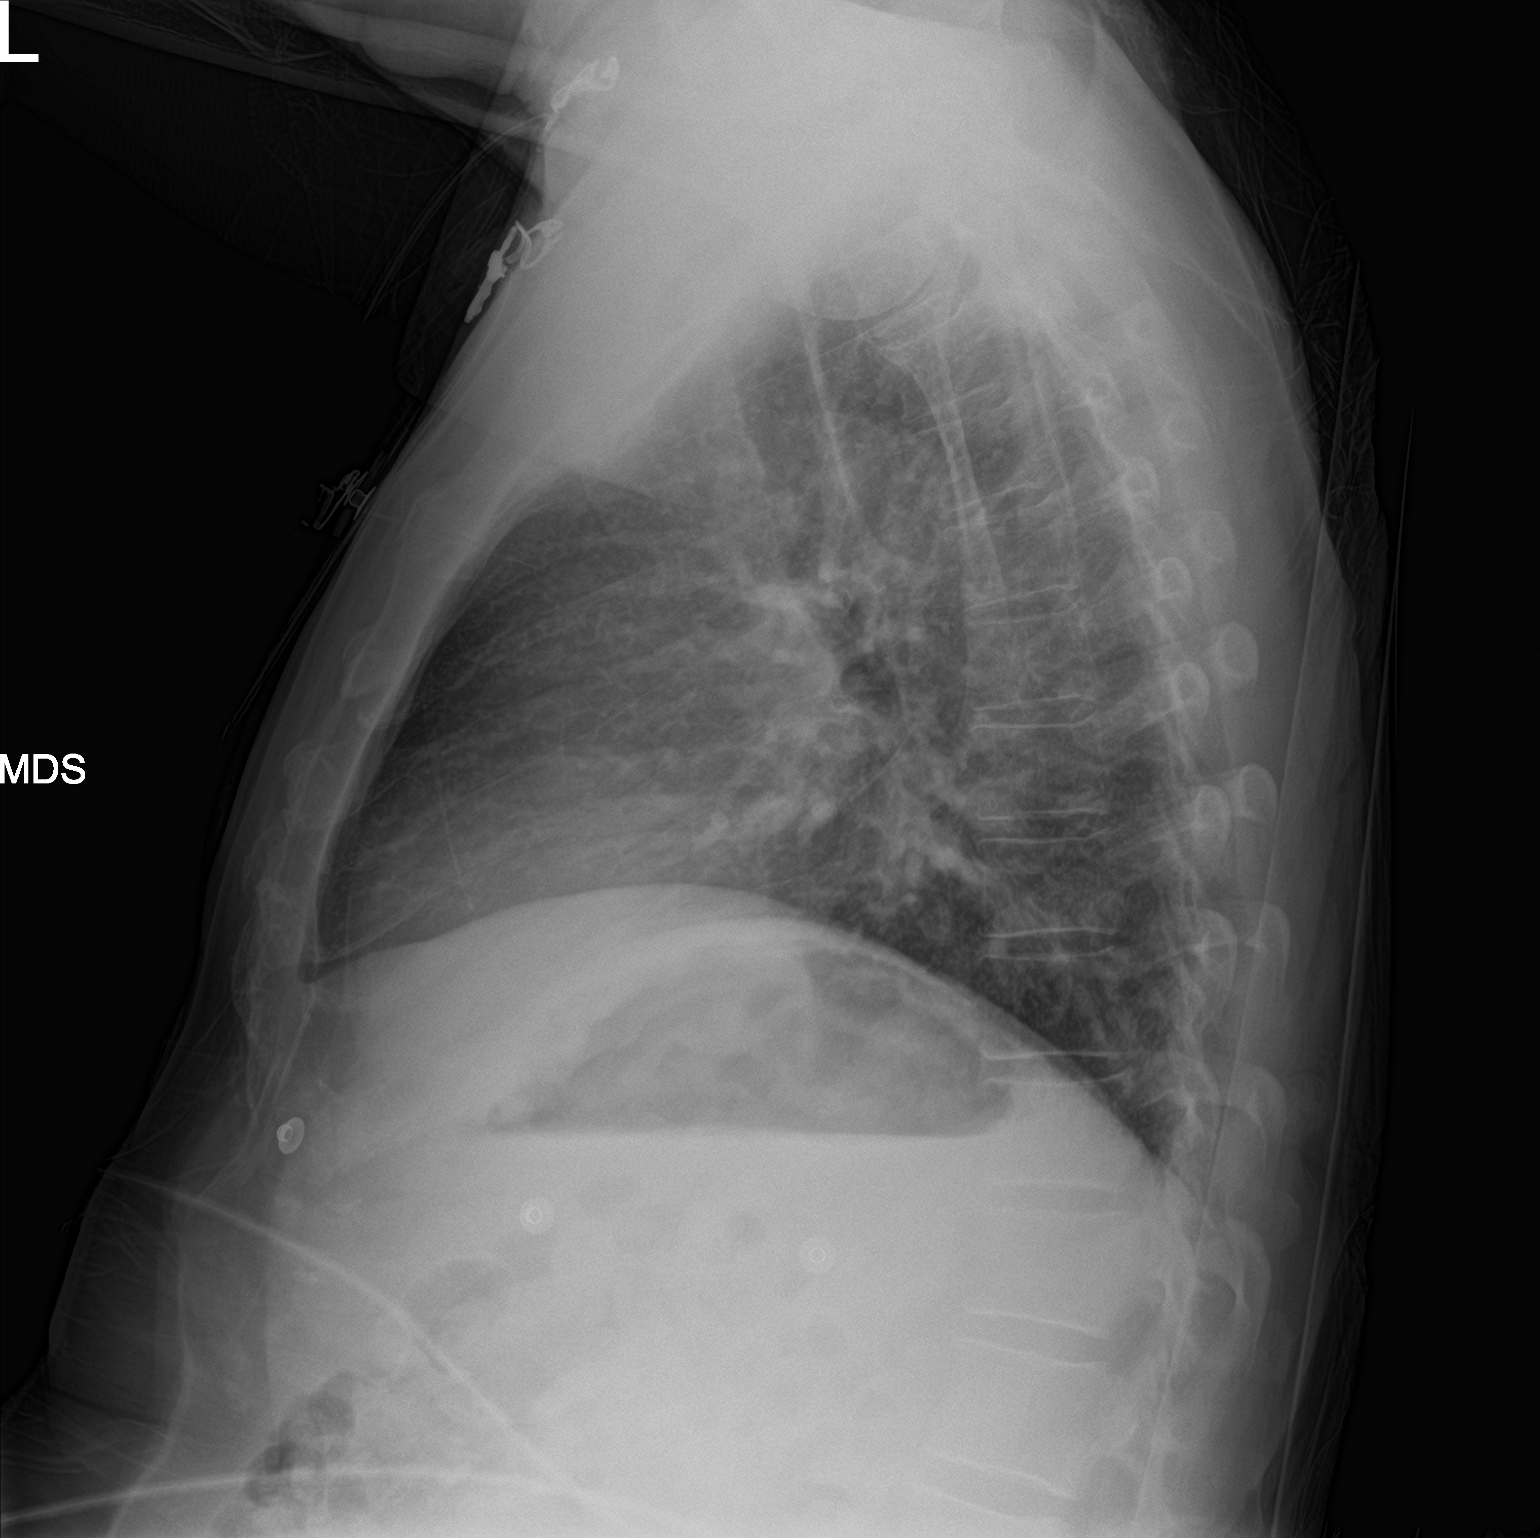

[chest ap]
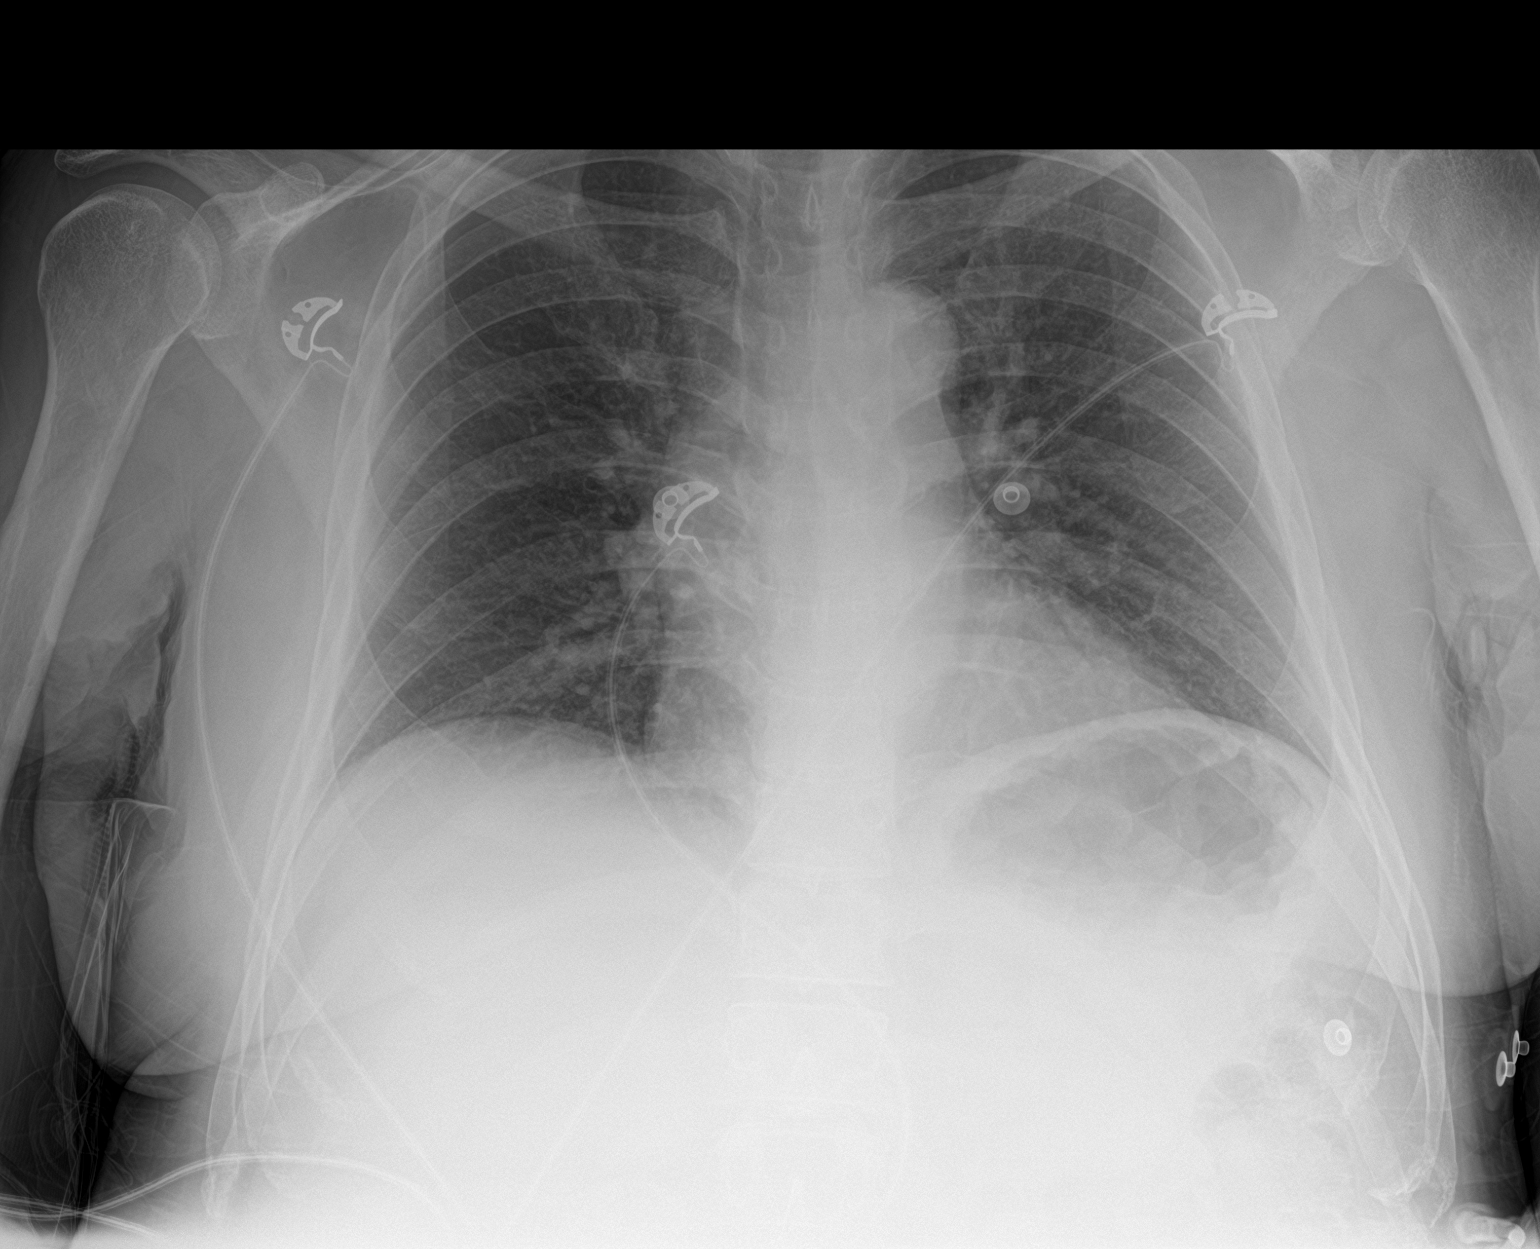

[2 of 2 positions shown; findings below may reference images not displayed]

FINDINGS: The heart size and mediastinal contours are within normal limits.
Both lungs are clear. The visualized skeletal structures are
unremarkable.
IMPRESSION: No active cardiopulmonary disease.

## 2019-03-20 IMAGING — CT CT ANGIO CHEST-ABD-PELV FOR DISSECTION W/ AND WO/W CM
2 of 9 series · 11 of 36 positions shown, 17 images · IV contrast (OMNI)
Comparison: CT angiogram dated 07/01/2017.

CLINICAL DATA: Left-sided chest pain starting today.

EXAM:
CT ANGIOGRAPHY CHEST, ABDOMEN AND PELVIS
TECHNIQUE: Multidetector CT imaging through the chest, abdomen and pelvis was
performed using the standard protocol during bolus administration of
intravenous contrast. Multiplanar reconstructed images and MIPs were
obtained and reviewed to evaluate the vascular anatomy.
CONTRAST:  80mL [id number] IOPAMIDOL ([id number]) INJECTION 76%

[Series 6: dissection 3.0 i30f 3 · axial · 0.93mm/px · z∈[+864,+1440]mm · 10 of 222 slices shown, 15 images]
[im 15/222  mediastinal]
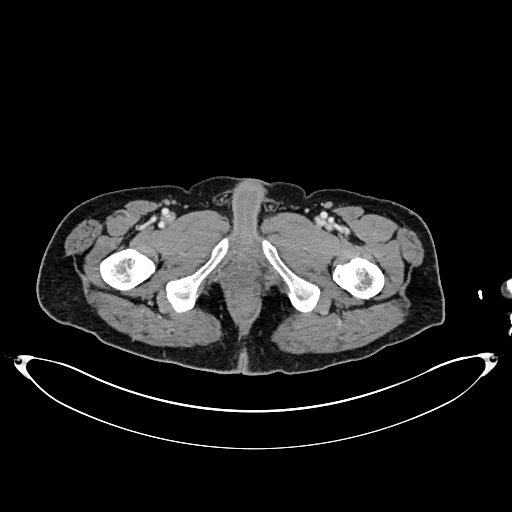
[im 15/222  bone]
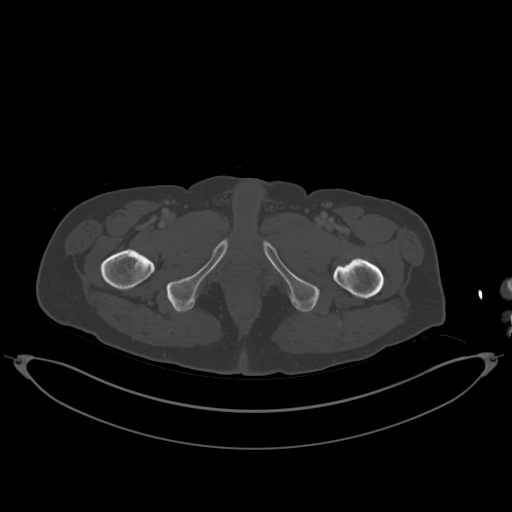
[im 45/222  mediastinal]
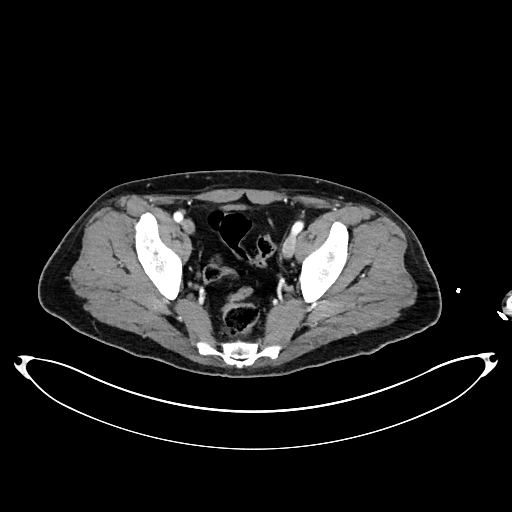
[im 59/222  mediastinal]
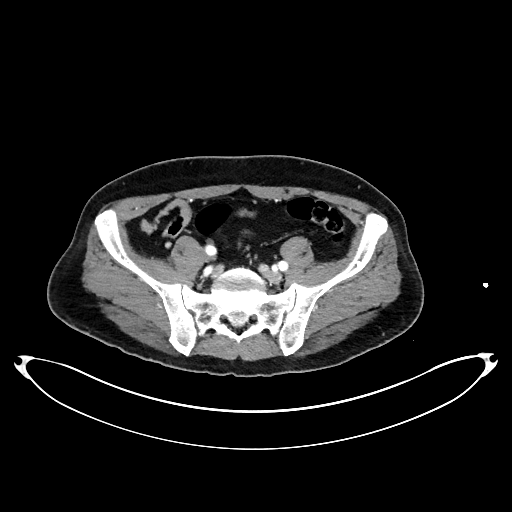
[im 89/222  mediastinal]
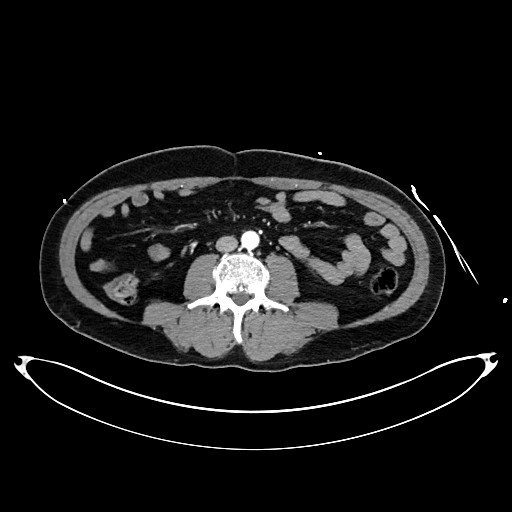
[im 118/222  mediastinal]
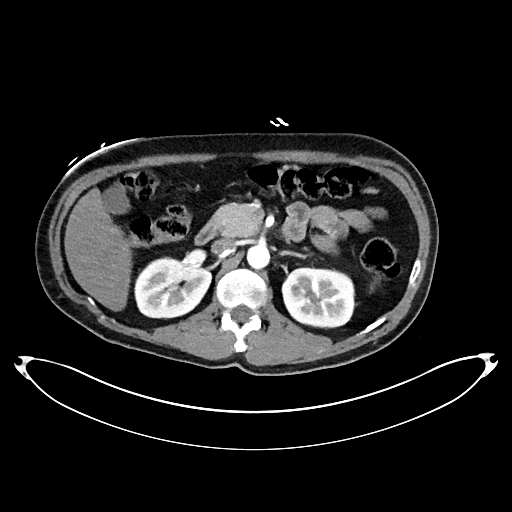
[im 133/222  mediastinal]
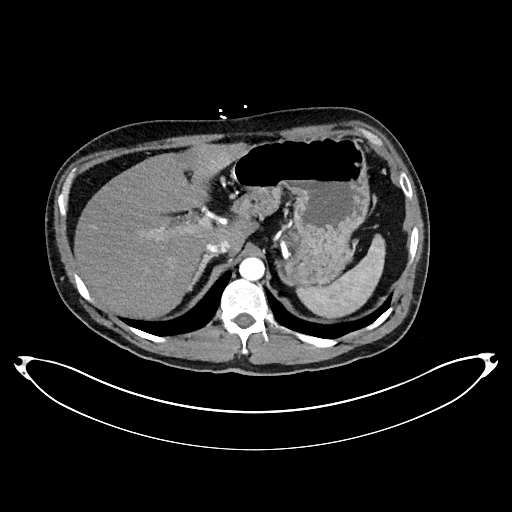
[im 163/222  mediastinal]
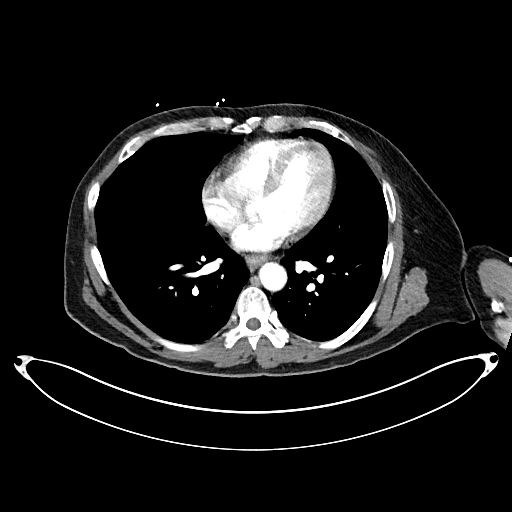
[im 163/222  lung]
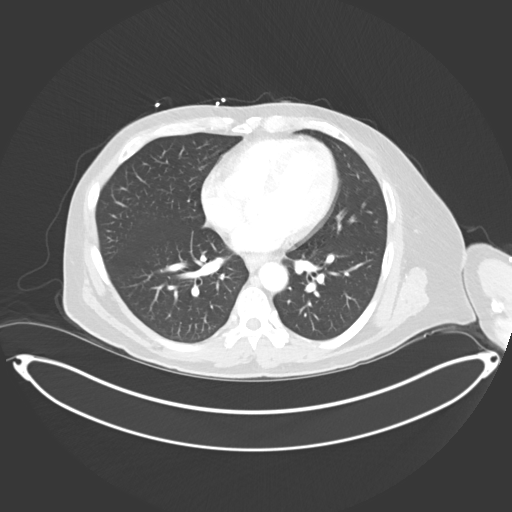
[im 177/222  mediastinal]
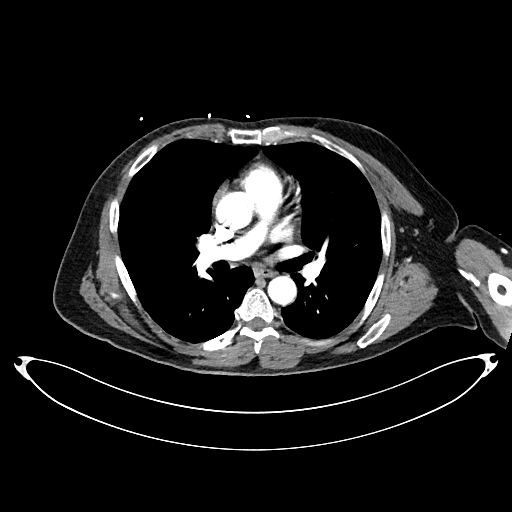
[im 177/222  lung]
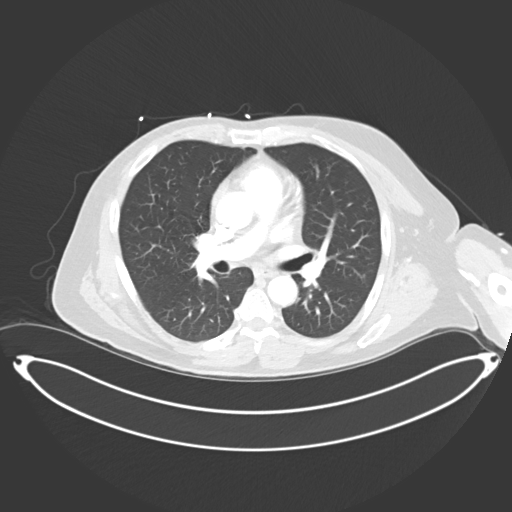
[im 192/222  lung]
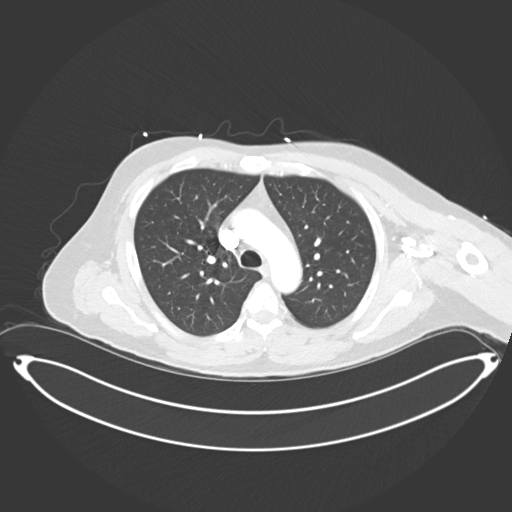
[im 207/222  mediastinal]
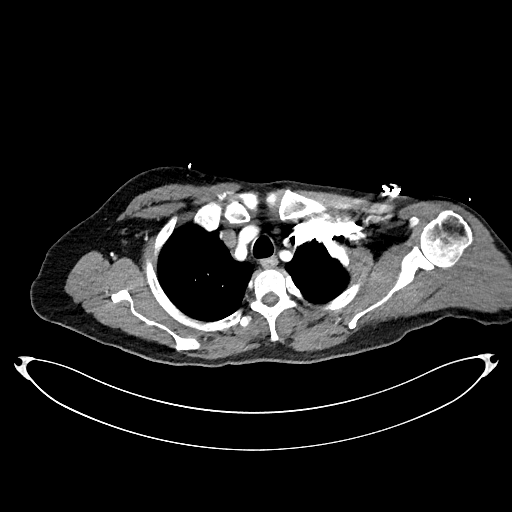
[im 207/222  lung]
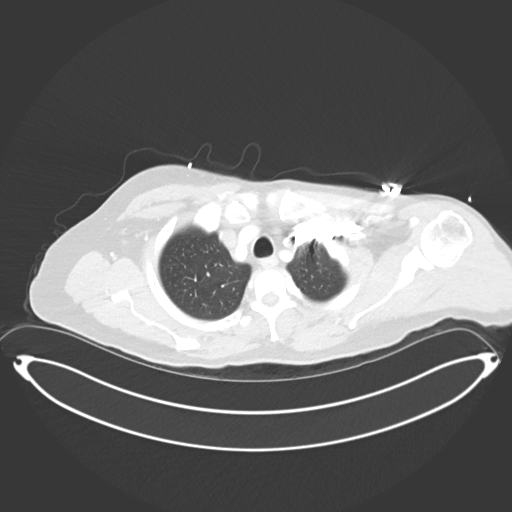
[im 207/222  bone]
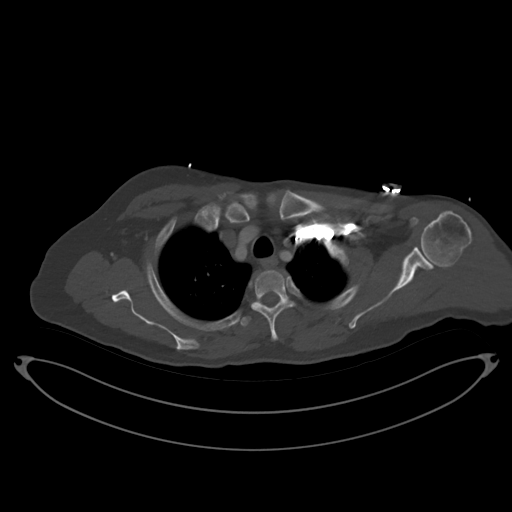

[Series 9: coronals · coronal · 0.97mm/px · 1 of 146 slices shown, 2 images]
[im 73/146  mediastinal]
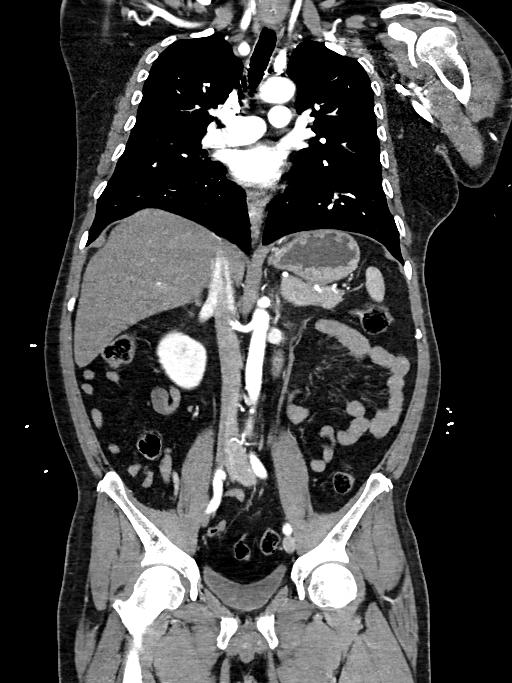
[im 73/146  bone]
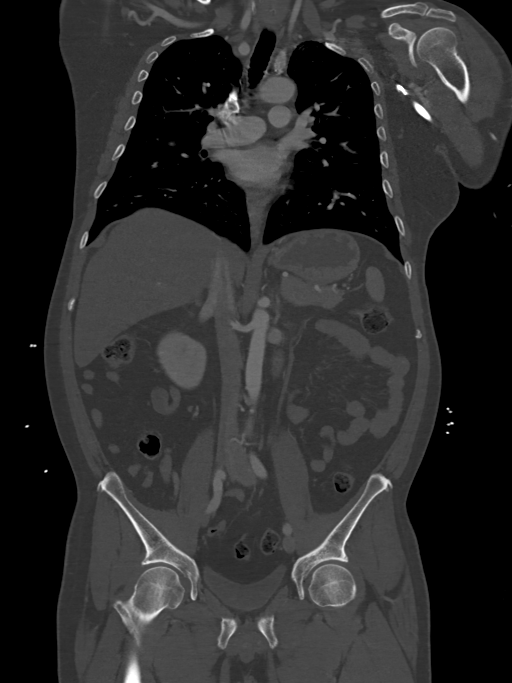

[11 of 36 positions shown; findings below may reference images not displayed]

FINDINGS: CTA CHEST FINDINGS

Cardiovascular: No thoracic aortic aneurysm or dissection. Heart
size is normal. No pericardial effusion. No pulmonary embolism
identified within the main, lobar or segmental pulmonary arteries
bilaterally.

Mediastinum/Nodes: No mass or enlarged lymph nodes within the
mediastinum or perihilar regions. Esophagus appears normal. Trachea
and central bronchi are unremarkable.

Lungs/Pleura: Lungs are clear.  No pleural effusion or pneumothorax.

Musculoskeletal: No chest wall abnormality. No acute or significant
osseous findings.

Review of the MIP images confirms the above findings.

CTA ABDOMEN AND PELVIS FINDINGS

VASCULAR

Aorta: Normal caliber aorta without aneurysm, dissection, vasculitis
or significant stenosis.

Celiac: Patent without evidence of aneurysm, dissection, vasculitis
or significant stenosis.

SMA: Patent without evidence of aneurysm, dissection, vasculitis or
significant stenosis.

Renals: Both renal arteries are patent without evidence of aneurysm,
dissection, vasculitis, fibromuscular dysplasia or significant
stenosis.

IMA: Patent without evidence of aneurysm, dissection, vasculitis or
significant stenosis.

Inflow: Patent without evidence of aneurysm, dissection, vasculitis
or significant stenosis.

Veins: No obvious venous abnormality within the limitations of this
arterial phase study.

Review of the MIP images confirms the above findings.

NON-VASCULAR

Hepatobiliary: No focal liver abnormality is seen. No gallstones,
gallbladder wall thickening, or biliary dilatation.

Pancreas: Unremarkable. No pancreatic ductal dilatation or
surrounding inflammatory changes.

Spleen: Normal in size without focal abnormality.

Adrenals/Urinary Tract: Adrenal glands appear normal. Kidneys appear
normal without mass, stone or hydronephrosis. No ureteral or bladder
calculi identified.

Stomach/Bowel: Bowel is normal in caliber. No bowel wall thickening
or evidence of bowel wall inflammation seen. Appendix is normal.

Lymphatic: No significant vascular findings are present. No enlarged
abdominal or pelvic lymph nodes.

Reproductive: Prostate is unremarkable.

Other: No free fluid or abscess collection. No free intraperitoneal
air.

Musculoskeletal: No acute or significant osseous findings.

Review of the MIP images confirms the above findings.
IMPRESSION: Normal CT angiogram of the chest, abdomen and pelvis. No aortic
aneurysm or dissection.

## 2019-05-20 ENCOUNTER — Other Ambulatory Visit: Payer: Self-pay

## 2019-05-20 DIAGNOSIS — Z20822 Contact with and (suspected) exposure to covid-19: Secondary | ICD-10-CM

## 2019-05-22 LAB — NOVEL CORONAVIRUS, NAA: SARS-CoV-2, NAA: NOT DETECTED

## 2019-12-05 ENCOUNTER — Other Ambulatory Visit: Payer: Self-pay

## 2019-12-05 ENCOUNTER — Encounter (HOSPITAL_COMMUNITY): Payer: Self-pay

## 2019-12-05 ENCOUNTER — Observation Stay (HOSPITAL_COMMUNITY)
Admission: EM | Admit: 2019-12-05 | Discharge: 2019-12-07 | Disposition: A | Discharge status: Home / Self Care | Payer: Self-pay | Attending: General Surgery | Admitting: General Surgery

## 2019-12-05 ENCOUNTER — Emergency Department (HOSPITAL_COMMUNITY): Payer: Self-pay

## 2019-12-05 DIAGNOSIS — S37012A Minor contusion of left kidney, initial encounter: Principal | ICD-10-CM

## 2019-12-05 DIAGNOSIS — G8929 Other chronic pain: Secondary | ICD-10-CM | POA: Insufficient documentation

## 2019-12-05 DIAGNOSIS — E1140 Type 2 diabetes mellitus with diabetic neuropathy, unspecified: Secondary | ICD-10-CM | POA: Insufficient documentation

## 2019-12-05 DIAGNOSIS — E1165 Type 2 diabetes mellitus with hyperglycemia: Secondary | ICD-10-CM

## 2019-12-05 DIAGNOSIS — E1110 Type 2 diabetes mellitus with ketoacidosis without coma: Secondary | ICD-10-CM

## 2019-12-05 DIAGNOSIS — K760 Fatty (change of) liver, not elsewhere classified: Secondary | ICD-10-CM | POA: Insufficient documentation

## 2019-12-05 DIAGNOSIS — Z833 Family history of diabetes mellitus: Secondary | ICD-10-CM | POA: Insufficient documentation

## 2019-12-05 DIAGNOSIS — S3991XA Unspecified injury of abdomen, initial encounter: Secondary | ICD-10-CM | POA: Insufficient documentation

## 2019-12-05 DIAGNOSIS — S37019A Minor contusion of unspecified kidney, initial encounter: Secondary | ICD-10-CM

## 2019-12-05 DIAGNOSIS — J90 Pleural effusion, not elsewhere classified: Secondary | ICD-10-CM | POA: Insufficient documentation

## 2019-12-05 DIAGNOSIS — Z20822 Contact with and (suspected) exposure to covid-19: Secondary | ICD-10-CM | POA: Insufficient documentation

## 2019-12-05 DIAGNOSIS — Z87891 Personal history of nicotine dependence: Secondary | ICD-10-CM | POA: Insufficient documentation

## 2019-12-05 DIAGNOSIS — S2242XA Multiple fractures of ribs, left side, initial encounter for closed fracture: Secondary | ICD-10-CM

## 2019-12-05 DIAGNOSIS — W11XXXA Fall on and from ladder, initial encounter: Secondary | ICD-10-CM | POA: Insufficient documentation

## 2019-12-05 DIAGNOSIS — I10 Essential (primary) hypertension: Secondary | ICD-10-CM | POA: Insufficient documentation

## 2019-12-05 DIAGNOSIS — T1490XA Injury, unspecified, initial encounter: Secondary | ICD-10-CM

## 2019-12-05 HISTORY — DX: Minor contusion of unspecified kidney, initial encounter: S37.019A

## 2019-12-05 LAB — CBC WITH DIFFERENTIAL/PLATELET
Abs Immature Granulocytes: 0.03 10*3/uL (ref 0.00–0.07)
Basophils Absolute: 0 10*3/uL (ref 0.0–0.1)
Basophils Relative: 0 %
Eosinophils Absolute: 0 10*3/uL (ref 0.0–0.5)
Eosinophils Relative: 0 %
HCT: 38 % — ABNORMAL LOW (ref 39.0–52.0)
Hemoglobin: 12.5 g/dL — ABNORMAL LOW (ref 13.0–17.0)
Immature Granulocytes: 0 %
Lymphocytes Relative: 16 %
Lymphs Abs: 1.4 10*3/uL (ref 0.7–4.0)
MCH: 27.3 pg (ref 26.0–34.0)
MCHC: 32.9 g/dL (ref 30.0–36.0)
MCV: 83 fL (ref 80.0–100.0)
Monocytes Absolute: 0.6 10*3/uL (ref 0.1–1.0)
Monocytes Relative: 7 %
Neutro Abs: 6.4 10*3/uL (ref 1.7–7.7)
Neutrophils Relative %: 77 %
Platelets: 214 10*3/uL (ref 150–400)
RBC: 4.58 MIL/uL (ref 4.22–5.81)
RDW: 13 % (ref 11.5–15.5)
WBC: 8.4 10*3/uL (ref 4.0–10.5)
nRBC: 0 % (ref 0.0–0.2)

## 2019-12-05 LAB — CBG MONITORING, ED
Glucose-Capillary: 362 mg/dL — ABNORMAL HIGH (ref 70–99)
Glucose-Capillary: 233 mg/dL — ABNORMAL HIGH (ref 70–99)
Glucose-Capillary: 166 mg/dL — ABNORMAL HIGH (ref 70–99)
Glucose-Capillary: 140 mg/dL — ABNORMAL HIGH (ref 70–99)

## 2019-12-05 LAB — URINALYSIS, ROUTINE W REFLEX MICROSCOPIC
Bacteria, UA: NONE SEEN
Bilirubin Urine: NEGATIVE
Glucose, UA: >=500 mg/dL — AB
Hgb urine dipstick: NEGATIVE
Ketones, ur: 20 mg/dL — AB
Leukocytes,Ua: NEGATIVE
Nitrite: NEGATIVE
Protein, ur: NEGATIVE mg/dL
Specific Gravity, Urine: 1.037 — ABNORMAL HIGH (ref 1.005–1.030)
pH: 6 (ref 5.0–8.0)

## 2019-12-05 LAB — HEMOGLOBIN AND HEMATOCRIT, BLOOD
HCT: 31.3 % — ABNORMAL LOW (ref 39.0–52.0)
HCT: 30.8 % — ABNORMAL LOW (ref 39.0–52.0)
Hemoglobin: 10 g/dL — ABNORMAL LOW (ref 13.0–17.0)
Hemoglobin: 10.1 g/dL — ABNORMAL LOW (ref 13.0–17.0)

## 2019-12-05 LAB — COMPREHENSIVE METABOLIC PANEL
ALT: 27 U/L (ref 0–44)
AST: 31 U/L (ref 15–41)
Albumin: 3.8 g/dL (ref 3.5–5.0)
Alkaline Phosphatase: 103 U/L (ref 38–126)
Anion gap: 14 (ref 5–15)
BUN: 22 mg/dL — ABNORMAL HIGH (ref 6–20)
CO2: 23 mmol/L (ref 22–32)
Calcium: 9.6 mg/dL (ref 8.9–10.3)
Chloride: 92 mmol/L — ABNORMAL LOW (ref 98–111)
Creatinine, Ser: 0.84 mg/dL (ref 0.61–1.24)
GFR calc Af Amer: >60 mL/min (ref >60)
GFR calc non Af Amer: >60 mL/min (ref >60)
Glucose, Bld: 489 mg/dL — ABNORMAL HIGH (ref 70–99)
Potassium: 4.8 mmol/L (ref 3.5–5.1)
Sodium: 129 mmol/L — ABNORMAL LOW (ref 135–145)
Total Bilirubin: 1.3 mg/dL — ABNORMAL HIGH (ref 0.3–1.2)
Total Protein: 7.1 g/dL (ref 6.5–8.1)

## 2019-12-05 LAB — TYPE AND SCREEN
ABO/RH(D): O POS
Antibody Screen: NEGATIVE

## 2019-12-05 LAB — HEMOGLOBIN A1C
Hgb A1c MFr Bld: 12.3 % — ABNORMAL HIGH (ref 4.8–5.6)
Mean Plasma Glucose: 306.31 mg/dL

## 2019-12-05 LAB — ETHANOL: Alcohol, Ethyl (B): <10 mg/dL (ref <10)

## 2019-12-05 LAB — PROTIME-INR
INR: 0.9 (ref 0.8–1.2)
Prothrombin Time: 11.9 seconds (ref 11.4–15.2)

## 2019-12-05 LAB — ABO/RH: ABO/RH(D): O POS

## 2019-12-05 LAB — HIV ANTIBODY (ROUTINE TESTING W REFLEX): HIV Screen 4th Generation wRfx: NONREACTIVE

## 2019-12-05 MED ORDER — HYDROMORPHONE HCL 1 MG/ML IJ SOLN
1.0000 mg | Freq: Once | INTRAMUSCULAR | Status: AC
  Administered 2019-12-05: 1 mg via INTRAVENOUS
  Filled 2019-12-05: qty 1

## 2019-12-05 MED ORDER — MORPHINE SULFATE (PF) 2 MG/ML IV SOLN
2.0000 mg | INTRAVENOUS | Status: DC | PRN
  Administered 2019-12-05 – 2019-12-06 (×5): 4 mg via INTRAVENOUS
  Administered 2019-12-07: 2 mg via INTRAVENOUS
  Filled 2019-12-05 (×3): qty 2
  Filled 2019-12-05: qty 1
  Filled 2019-12-05 (×2): qty 2

## 2019-12-05 MED ORDER — SODIUM CHLORIDE 0.9 % IV BOLUS
1000.0000 mL | Freq: Once | INTRAVENOUS | Status: AC
  Administered 2019-12-05: 1000 mL via INTRAVENOUS

## 2019-12-05 MED ORDER — INSULIN ASPART 100 UNIT/ML ~~LOC~~ SOLN
0.0000 [IU] | SUBCUTANEOUS | Status: DC
  Administered 2019-12-05: 3 [IU] via SUBCUTANEOUS
  Administered 2019-12-05: 7 [IU] via SUBCUTANEOUS
  Administered 2019-12-06 (×2): 4 [IU] via SUBCUTANEOUS
  Administered 2019-12-06 – 2019-12-07 (×3): 7 [IU] via SUBCUTANEOUS
  Administered 2019-12-07: 3 [IU] via SUBCUTANEOUS

## 2019-12-05 MED ORDER — ONDANSETRON 4 MG PO TBDP: 4.0000 mg | ORAL_TABLET | Freq: Four times a day (QID) | ORAL | Status: DC | PRN

## 2019-12-05 MED ORDER — ACETAMINOPHEN 325 MG PO TABS
650.0000 mg | ORAL_TABLET | ORAL | Status: DC | PRN
  Administered 2019-12-05: 650 mg via ORAL
  Filled 2019-12-05: qty 2

## 2019-12-05 MED ORDER — ONDANSETRON HCL 4 MG/2ML IJ SOLN
4.0000 mg | Freq: Four times a day (QID) | INTRAMUSCULAR | Status: DC | PRN
  Administered 2019-12-05: 4 mg via INTRAVENOUS
  Filled 2019-12-05: qty 2

## 2019-12-05 MED ORDER — SODIUM CHLORIDE 0.9 % IV SOLN: INTRAVENOUS | Status: DC

## 2019-12-05 MED ORDER — OXYCODONE HCL 5 MG PO TABS
5.0000 mg | ORAL_TABLET | ORAL | Status: DC | PRN
  Administered 2019-12-05 – 2019-12-07 (×3): 5 mg via ORAL
  Filled 2019-12-05 (×3): qty 1

## 2019-12-05 MED ORDER — IOHEXOL 300 MG/ML  SOLN
100.0000 mL | Freq: Once | INTRAMUSCULAR | Status: AC | PRN
  Administered 2019-12-05: 100 mL via INTRAVENOUS

## 2019-12-05 MED ORDER — ONDANSETRON HCL 4 MG/2ML IJ SOLN
4.0000 mg | Freq: Once | INTRAMUSCULAR | Status: AC
  Administered 2019-12-05: 4 mg via INTRAVENOUS
  Filled 2019-12-05: qty 2

## 2019-12-05 NOTE — ED Provider Notes (Signed)
[facility] Provider Note   CSN: [id number] Arrival date & time: 12/05/19  1213     History Chief Complaint  Patient presents with  . Fall    Jacob FORDHAM is a 44 y.o. male.  44 year old male presents with injuries from a fall which occurred 2 days ago.  Patient states that he was on a ladder painting on the second story of an apartment when he lost his balance and fell rolling down the steps and landing on gravel.  Patient denies hitting his head or loss of consciousness, is not anticoagulated.  Patient reports pain in his left ribs, abdomen, left elbow and both knees.  Patient denies vomiting or hematuria, neck pain.  Patient has a history of diabetes, does not take medication for it as he does not have a doctor at this time.  No history of hypertension.  No other injuries, complaints, concerns today. Not a current smoker, does drink alcohol but not daily.        Past Medical History:  Diagnosis Date  . Anxiety   . Chronic lower back pain   . Diabetes mellitus type II, uncontrolled ([place])    "I think it's type 1" (07/31/2017)  . DKA (diabetic ketoacidoses) ([place]) 07/30/2017  . History of stomach ulcers   . Hypertension     Patient Active Problem List   Diagnosis Date Noted  . Essential hypertension 08/24/2017  . Neuropathy 08/24/2017  . Hyperlipidemia 08/22/2017  . Noncompliance with medication regimen 08/04/2017  . Alcohol withdrawal syndrome, with delirium ([place]) 07/01/2017  . DKA (diabetic ketoacidoses) ([place]) 07/01/2017  . AKI (acute kidney injury) ([place]) 07/01/2017  . Alcoholic gastritis   . Costochondritis 04/06/2015  . DM (diabetes mellitus) type 2, uncontrolled 04/01/2015  . Hypokalemia 04/01/2015  . Hypomagnesemia 04/01/2015  . Alcohol withdrawal delirium, acute, hyperactive ([place]) 03/30/2015    Past Surgical History:  Procedure Laterality Date  . BACK SURGERY    . [place] SURGERY  2004       Family History    Problem Relation Age of Onset  . Diabetes Father     Social History   Tobacco Use  . Smoking status: Former Smoker    Packs/day: 0.10    Years: 28.00    Pack years: 2.80    Types: Cigarettes  . Smokeless tobacco: Never Used  . Tobacco comment: 07/31/2017 "stopped before Thanksgiving"  Substance Use Topics  . Alcohol use: Yes    Alcohol/week: 28.0 standard drinks    Types: 28 Cans of beer per week    Comment: 07/31/2017 "4 beers/day on average"  . Drug use: Not Currently    Types: Cocaine    Comment: 07/31/2017 "none since ~ 05/2017"    Home Medications Prior to Admission medications   Medication Sig Start Date End Date Taking? Authorizing Provider  aspirin EC 81 MG tablet Take 1 tablet (81 mg total) by mouth daily. Patient not taking: Reported on 08/27/2018 08/22/17   Dorena Dew, FNP  atorvastatin (LIPITOR) 20 MG tablet Take 1 tablet (20 mg total) by mouth daily. Patient not taking: Reported on 12/05/2019 08/27/18   Lanae Boast, [place]  blood glucose meter kit and supplies KIT Dispense based on patient and insurance preference. Daily in AM Patient not taking: Reported on 12/05/2019 08/27/18   Lanae Boast, FNP  gabapentin (NEURONTIN) 100 MG capsule Take 1 capsule (100 mg total) by mouth 3 (three) times daily. Patient not taking: Reported on 12/05/2019 08/27/18  Lanae Boast, FNP  glimepiride (AMARYL) 4 MG tablet Take 1 tablet (4 mg total) by mouth daily before breakfast. Patient not taking: Reported on 12/05/2019 08/27/18   Lanae Boast, FNP  hydrochlorothiazide (HYDRODIURIL) 25 MG tablet Take 1 tablet (25 mg total) by mouth daily. Patient not taking: Reported on 12/05/2019 08/27/18   Lanae Boast, FNP  insulin NPH-regular Human (NOVOLIN 70/30) (70-30) 100 UNIT/ML injection Inject 10 Units into the skin 2 (two) times daily with a meal. Patient not taking: Reported on 08/27/2018 08/04/17   Phillips Grout, MD  Insulin Syringes, Disposable, U-100 0.5 ML MISC Take insulin as  directed Patient not taking: Reported on 08/27/2018 08/04/17   Phillips Grout, MD  lisinopril (PRINIVIL,ZESTRIL) 2.5 MG tablet Take 1 tablet (2.5 mg total) by mouth daily. Patient not taking: Reported on 12/05/2019 08/27/18   Lanae Boast, FNP  metFORMIN (GLUCOPHAGE) 500 MG tablet Take 1 tablet (500 mg total) by mouth 2 (two) times daily with a meal. Patient not taking: Reported on 12/05/2019 08/27/18   Lanae Boast, [place]    Allergies    Patient has no known allergies.  Review of Systems   Review of Systems  Constitutional: Negative for fever.  Eyes: Negative for visual disturbance.  Respiratory: Negative for shortness of breath.   Cardiovascular: Positive for chest pain.       Chest wall pain  Gastrointestinal: Positive for abdominal pain. Negative for blood in stool, constipation, diarrhea, nausea and vomiting.  Genitourinary: Negative for hematuria.  Musculoskeletal: Positive for arthralgias and back pain. Negative for joint swelling, neck pain and neck stiffness.  Skin: Positive for wound.  Allergic/Immunologic: Positive for immunocompromised state.  Neurological: Negative for weakness and headaches.  Hematological: Does not bruise/bleed easily.  Psychiatric/Behavioral: Negative for confusion.  All other systems reviewed and are negative.   Physical Exam Updated Vital Signs BP (!) 140/96   Pulse 98   Temp 99 F (37.2 C) (Oral)   Resp 16   Ht 5' 4" (1.626 m)   Wt 89.8 kg   SpO2 96%   BMI 33.98 kg/m   Physical Exam Vitals and nursing note reviewed.  Constitutional:      General: He is not in acute distress.    Appearance: He is well-developed. He is not diaphoretic.  HENT:     Head: Normocephalic and atraumatic.  Eyes:     Extraocular Movements: Extraocular movements intact.     Pupils: Pupils are equal, round, and reactive to light.  Cardiovascular:     Rate and Rhythm: Regular rhythm. Tachycardia present.     Pulses: Normal pulses.     Heart sounds: Normal heart  sounds.  Pulmonary:     Effort: Pulmonary effort is normal.     Breath sounds: Normal breath sounds.  Chest:     Chest wall: Tenderness present.  Abdominal:     Palpations: Abdomen is soft.     Tenderness: There is generalized abdominal tenderness.     Comments: Ecchymosis to left flank area. Generalized abdominal tenderness.   Musculoskeletal:        General: Swelling and tenderness present.     Cervical back: Normal range of motion and neck supple. No tenderness.     Right lower leg: No edema.     Left lower leg: No edema.     Comments: Bruising and abrasion, normal range of motion left elbow, no crepitus, normal radial pulses. Mild tenderness to bilateral knees without crepitus or deformity.  Pulse and sensation intact  to distal extremities.  Skin:    General: Skin is warm and dry.     Findings: No erythema or rash.  Neurological:     Mental Status: He is alert and oriented to person, place, and time.  Psychiatric:        Behavior: Behavior normal.     ED Results / Procedures / Treatments   Labs (all labs ordered are listed, but only abnormal results are displayed) Labs Reviewed  COMPREHENSIVE METABOLIC PANEL - Abnormal; Notable for the following components:      Result Value   Sodium 129 (*)    Chloride 92 (*)    Glucose, Bld 489 (*)    BUN 22 (*)    Total Bilirubin 1.3 (*)    All other components within normal limits  CBC WITH DIFFERENTIAL/PLATELET - Abnormal; Notable for the following components:   Hemoglobin 12.5 (*)    HCT 38.0 (*)    All other components within normal limits  URINALYSIS, ROUTINE W REFLEX MICROSCOPIC - Abnormal; Notable for the following components:   Color, Urine STRAW (*)    Specific Gravity, Urine 1.037 (*)    Glucose, UA >=500 (*)    Ketones, ur 20 (*)    All other components within normal limits  HEMOGLOBIN A1C - Abnormal; Notable for the following components:   Hgb A1c MFr Bld 12.3 (*)    All other components within normal limits   HEMOGLOBIN AND HEMATOCRIT, BLOOD - Abnormal; Notable for the following components:   Hemoglobin 10.0 (*)    HCT 31.3 (*)    All other components within normal limits  CBG MONITORING, ED - Abnormal; Notable for the following components:   Glucose-Capillary 362 (*)    All other components within normal limits  CBG MONITORING, ED - Abnormal; Notable for the following components:   Glucose-Capillary 233 (*)    All other components within normal limits  ETHANOL  PROTIME-INR  HEMOGLOBIN AND HEMATOCRIT, BLOOD  HEMOGLOBIN AND HEMATOCRIT, BLOOD  TYPE AND SCREEN  ABO/RH    EKG EKG Interpretation  Date/Time:  Thursday December 05 2019 12:45:55 EDT Ventricular Rate:  112 PR Interval:    QRS Duration: 82 QT Interval:  336 QTC Calculation: 459 R Axis:   52 Text Interpretation: Sinus tachycardia Low voltage, precordial leads Baseline wander in lead(s) V1 Confirmed by Davonna Belling [phone]) on 12/05/2019 2:24:57 PM   Radiology DG Chest 1 View  Result Date: 12/05/2019 CLINICAL DATA:  Fall. EXAM: CHEST  1 VIEW COMPARISON:  Chest x-ray dated Dec 01, 2017. FINDINGS: The heart size and mediastinal contours are within normal limits. Both lungs are clear. The visualized skeletal structures are unremarkable. IMPRESSION: No active disease. Electronically Signed   By: Titus Dubin M.D.   On: 12/05/2019 14:03   DG Pelvis 1-2 Views  Result Date: 12/05/2019 CLINICAL DATA:  Fall. EXAM: PELVIS - 1-2 VIEW COMPARISON:  CT a chest, abdomen, and pelvis dated July 31, 2017. FINDINGS: There is no evidence of pelvic fracture or diastasis. No pelvic bone lesions are seen. Chronic soft tissue calcification lateral to the right acetabulum. IMPRESSION: No acute osseous abnormality. Electronically Signed   By: Titus Dubin M.D.   On: 12/05/2019 14:00   DG Elbow Complete Left  Result Date: 12/05/2019 CLINICAL DATA:  Fall. EXAM: LEFT ELBOW - COMPLETE 3+ VIEW COMPARISON:  None. FINDINGS: There is no evidence of  fracture, dislocation, or joint effusion. There is no evidence of arthropathy or other focal bone abnormality. Triceps enthesophyte.  Soft tissues are unremarkable. IMPRESSION: Negative. Electronically Signed   By: Titus Dubin M.D.   On: 12/05/2019 14:03   CT Chest W Contrast  Result Date: 12/05/2019 CLINICAL DATA:  Abdominal trauma, fall from ladder Tuesday EXAM: CT CHEST, ABDOMEN, AND PELVIS WITH CONTRAST TECHNIQUE: Multidetector CT imaging of the chest, abdomen and pelvis was performed following the standard protocol during bolus administration of intravenous contrast. CONTRAST:  184m OMNIPAQUE IOHEXOL 300 MG/ML  SOLN COMPARISON:  CT angiogram chest abdomen pelvis, 07/30/2017 FINDINGS: CT CHEST FINDINGS Cardiovascular: No significant vascular findings. Normal heart size. No pericardial effusion. Mediastinum/Nodes: No enlarged mediastinal, hilar, or axillary lymph nodes. Thyroid gland, trachea, and esophagus demonstrate no significant findings. Lungs/Pleura: Trace left pleural effusion associated atelectasis or consolidation. Musculoskeletal: No chest wall mass. Minimally displaced fractures of the posterior left tenth and eleventh ribs (series 3, image 53). CT ABDOMEN PELVIS FINDINGS Hepatobiliary: Hepatomegaly, maximum coronal span 22 cm. Hepatic steatosis. No gallstones, gallbladder wall thickening, or biliary dilatation. Pancreas: Unremarkable. No pancreatic ductal dilatation or surrounding inflammatory changes. Spleen: Normal in size without significant abnormality. Adrenals/Urinary Tract: Adrenal glands are unremarkable. There is a subcapsular hematoma about the left kidney, measuring approximately 3.1 cm in thickness (series 3, image 64). The right kidney is normal. No hydronephrosis. Bladder is unremarkable. Stomach/Bowel: Stomach is within normal limits. The appendix is fluid-filled, measuring up to 1.2 cm in caliber (series 3, image 93). There is a fluid-filled lesion at the right cecal base  adjacent to the appendiceal os measuring 2.8 x 2.3 cm (series 3, image 91). No evidence of bowel wall thickening, distention, or inflammatory changes. Vascular/Lymphatic: No significant vascular findings are present. No enlarged abdominal or pelvic lymph nodes. Reproductive: No mass or other abnormality. Other: No abdominal wall hernia or abnormality. No abdominopelvic ascites. Small left retroperitoneal hematoma measuring up to 1.3 cm in thickness (series 3, image 93). Musculoskeletal: No acute or significant osseous findings. IMPRESSION: 1. Minimally displaced fractures of the posterior left tenth and eleventh ribs. Trace left pleural effusion with associated atelectasis or consolidation. 2. There is a subcapsular hematoma about the left kidney, measuring approximately 3.1 cm in thickness. 3. Small adjacent left retroperitoneal hematoma measuring up to 1.3 cm in thickness. 4. The appendix is fluid-filled, measuring up to 1.2 cm in caliber. There is a fluid-filled lesion at the right cecal base adjacent to the appendiceal os measuring 2.8 x 2.3 cm. 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 Recommend nonemergent surgical consultation due to malignant potential. 5. Hepatomegaly and hepatic steatosis. These results were called by telephone at the time of interpretation on 12/05/2019 at 4:46 pm to [address], who verbally acknowledged these results. Electronically Signed   By: AEddie CandleM.D.   On: 12/05/2019 16:50   CT ABDOMEN PELVIS W CONTRAST  Result Date: 12/05/2019 CLINICAL DATA:  Abdominal trauma, fall from ladder Tuesday EXAM: CT CHEST, ABDOMEN, AND PELVIS WITH CONTRAST TECHNIQUE: Multidetector CT imaging of the chest, abdomen and pelvis was performed following the standard protocol during bolus administration of intravenous contrast. CONTRAST:  1051mOMNIPAQUE IOHEXOL 300 MG/ML  SOLN COMPARISON:  CT angiogram chest abdomen pelvis,  07/30/2017 FINDINGS: CT CHEST FINDINGS Cardiovascular: No significant vascular findings. Normal heart size. No pericardial effusion. Mediastinum/Nodes: No enlarged mediastinal, hilar, or axillary lymph nodes. Thyroid gland, trachea, and esophagus demonstrate no significant findings. Lungs/Pleura: Trace left pleural effusion associated atelectasis or consolidation. Musculoskeletal: No chest wall mass. Minimally displaced fractures of the posterior  left tenth and eleventh ribs (series 3, image 53). CT ABDOMEN PELVIS FINDINGS Hepatobiliary: Hepatomegaly, maximum coronal span 22 cm. Hepatic steatosis. No gallstones, gallbladder wall thickening, or biliary dilatation. Pancreas: Unremarkable. No pancreatic ductal dilatation or surrounding inflammatory changes. Spleen: Normal in size without significant abnormality. Adrenals/Urinary Tract: Adrenal glands are unremarkable. There is a subcapsular hematoma about the left kidney, measuring approximately 3.1 cm in thickness (series 3, image 64). The right kidney is normal. No hydronephrosis. Bladder is unremarkable. Stomach/Bowel: Stomach is within normal limits. The appendix is fluid-filled, measuring up to 1.2 cm in caliber (series 3, image 93). There is a fluid-filled lesion at the right cecal base adjacent to the appendiceal os measuring 2.8 x 2.3 cm (series 3, image 91). No evidence of bowel wall thickening, distention, or inflammatory changes. Vascular/Lymphatic: No significant vascular findings are present. No enlarged abdominal or pelvic lymph nodes. Reproductive: No mass or other abnormality. Other: No abdominal wall hernia or abnormality. No abdominopelvic ascites. Small left retroperitoneal hematoma measuring up to 1.3 cm in thickness (series 3, image 93). Musculoskeletal: No acute or significant osseous findings. IMPRESSION: 1. Minimally displaced fractures of the posterior left tenth and eleventh ribs. Trace left pleural effusion with associated atelectasis or  consolidation. 2. There is a subcapsular hematoma about the left kidney, measuring approximately 3.1 cm in thickness. 3. Small adjacent left retroperitoneal hematoma measuring up to 1.3 cm in thickness. 4. The appendix is fluid-filled, measuring up to 1.2 cm in caliber. There is a fluid-filled lesion at the right cecal base adjacent to the appendiceal os measuring 2.8 x 2.3 cm. 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 Recommend nonemergent surgical consultation due to malignant potential. 5. Hepatomegaly and hepatic steatosis. These results were called by telephone at the time of interpretation on 12/05/2019 at 4:46 pm to [place] , who verbally acknowledged these results. Electronically Signed   By: Eddie Candle M.D.   On: 12/05/2019 16:50   DG Knee Complete 4 Views Left  Result Date: 12/05/2019 CLINICAL DATA:  Fall. EXAM: LEFT KNEE - COMPLETE 4+ VIEW COMPARISON:  None. FINDINGS: No evidence of fracture, dislocation, or joint effusion. No evidence of arthropathy or other focal bone abnormality. Soft tissues are unremarkable. IMPRESSION: Negative. Electronically Signed   By: Titus Dubin M.D.   On: 12/05/2019 14:01   DG Knee Complete 4 Views Right  Result Date: 12/05/2019 CLINICAL DATA:  Fall. EXAM: RIGHT KNEE - COMPLETE 4+ VIEW COMPARISON:  None. FINDINGS: No evidence of fracture, dislocation, or joint effusion. No evidence of arthropathy or other focal bone abnormality. Soft tissues are unremarkable. IMPRESSION: Negative. Electronically Signed   By: Titus Dubin M.D.   On: 12/05/2019 14:00    Procedures Procedures (including critical care time)  Medications Ordered in ED Medications  insulin aspart (novoLOG) injection 0-20 Units (7 Units Subcutaneous Given 12/05/19 1731)  HYDROmorphone (DILAUDID) injection 1 mg (1 mg Intravenous Given 12/05/19 1343)  ondansetron (ZOFRAN) injection 4 mg (4 mg Intravenous Given 12/05/19 1343)   sodium chloride 0.9 % bolus 1,000 mL (0 mLs Intravenous Stopped 12/05/19 1637)  sodium chloride 0.9 % bolus 1,000 mL (1,000 mLs Intravenous New Bag/Given 12/05/19 1455)  iohexol (OMNIPAQUE) 300 MG/ML solution 100 mL (100 mLs Intravenous Contrast Given 12/05/19 1614)  HYDROmorphone (DILAUDID) injection 1 mg (1 mg Intravenous Given 12/05/19 1647)    ED Course  I have reviewed the triage vital signs and the nursing notes.  Pertinent labs & imaging results  that were available during my care of the patient were reviewed by me and considered in my medical decision making (see chart for details).  Clinical Course as of Dec 04 1798  Thu Dec 04, 2508  3729 44 year old male presents with left rib and flank pain after fall from significant height 2 days ago.  On exam has left flank ecchymosis, left lower rib tenderness, generalized abdominal pain.   [LM]  1647 Patient is hypertensive and tachycardic, known diabetic however not taking his medications.  Patient was given IV fluids.  Lab work obtained, CBC with slight decrease in his hemoglobin to 12.5 today previously 14.3. CBG with sodium of 129, glucose of 489.  Chest x-ray and pelvis x-ray ordered as part of trauma panel, no significant injury identified. X-rays of left elbow and bilateral knees without acute bony injury. Case discussed with Dr. Kieth Brightly on-call with trauma surgery pending formal CT report.  Dr. Kieth Brightly will watch for formal CT read. CT chest abdomen pelvis for trauma returns with concern for left rib fracture with effusion but no pneumothorax.  Patient does have a subcapsular hematoma with retroperitoneal hemorrhage, no other evidence of solid organ injury.  Also question appendiceal mucocele and general surgery for possible malignancy.   [LM]    Clinical Course User Index [LM] Jacob Schneider   MDM Rules/Calculators/A&P                      Final Clinical Impression(s) / ED Diagnoses Final diagnoses:  Traumatic hematoma of  left kidney  Closed fracture of multiple ribs of left side, initial encounter  Hyperglycemia due to diabetes mellitus [facility])    Rx / DC Orders ED Discharge Orders    None       Tacy Learn, PA-C 12/05/19 1800    Davonna Belling, MD 12/06/19 [phone]

## 2019-12-05 NOTE — H&P (Signed)
Activation and Reason: consult, fall  Primary Survey: airway intact, breath sounds present bilaterally, distal pulses intact  Jacob Schneider is an 44 y.o. male.  HPI: (HPI performed with phone interpreter) 44 yo male was working at a house when he missed his footing and fell down the stairs. He fell on his left side. He did not lose consciousness. He has had worsening pain on the left side over the last 2 days so came into the hospital. He denies dizziness. Pain is worse with deep breathing.  Past Medical History:  Diagnosis Date  . Anxiety   . Chronic lower back pain   . Diabetes mellitus type II, uncontrolled (HCC)    "I think it's type 1" (07/31/2017)  . DKA (diabetic ketoacidoses) (HCC) 07/30/2017  . History of stomach ulcers   . Hypertension     Past Surgical History:  Procedure Laterality Date  . BACK SURGERY    . LUMBAR DISC SURGERY  2004    Family History  Problem Relation Age of Onset  . Diabetes Father     Social History:  reports that he has quit smoking. His smoking use included cigarettes. He has a 2.80 pack-year smoking history. He has never used smokeless tobacco. He reports current alcohol use of about 28.0 standard drinks of alcohol per week. He reports previous drug use. Drug: Cocaine.  Allergies: No Known Allergies  Medications: I have reviewed the patient's current medications.  Results for orders placed or performed during the hospital encounter of 12/05/19 (from the past 48 hour(s))  Comprehensive metabolic panel     Status: Abnormal   Collection Time: 12/05/19 12:47 PM  Result Value Ref Range   Sodium 129 (L) 135 - 145 mmol/L   Potassium 4.8 3.5 - 5.1 mmol/L   Chloride 92 (L) 98 - 111 mmol/L   CO2 23 22 - 32 mmol/L   Glucose, Bld 489 (H) 70 - 99 mg/dL    Comment: Glucose reference range applies only to samples taken after fasting for at least 8 hours.   BUN 22 (H) 6 - 20 mg/dL   Creatinine, Ser 0.37 0.61 - 1.24 mg/dL   Calcium 9.6 8.9 - 04.8  mg/dL   Total Protein 7.1 6.5 - 8.1 g/dL   Albumin 3.8 3.5 - 5.0 g/dL   AST 31 15 - 41 U/L   ALT 27 0 - 44 U/L   Alkaline Phosphatase 103 38 - 126 U/L   Total Bilirubin 1.3 (H) 0.3 - 1.2 mg/dL   GFR calc non Af Amer >60 >60 mL/min   GFR calc Af Amer >60 >60 mL/min   Anion gap 14 5 - 15    Comment: Performed at [facility], [address] [place], [place] [address]  CBC with Differential     Status: Abnormal   Collection Time: 12/05/19 12:47 PM  Result Value Ref Range   WBC 8.4 4.0 - 10.5 K/uL   RBC 4.58 4.22 - 5.81 MIL/uL   Hemoglobin 12.5 (L) 13.0 - 17.0 g/dL   HCT 94.5 (L) 03.8 - 88.2 %   MCV 83.0 80.0 - 100.0 fL   MCH 27.3 26.0 - 34.0 pg   MCHC 32.9 30.0 - 36.0 g/dL   RDW 80.0 34.9 - 17.9 %   Platelets 214 150 - 400 K/uL   nRBC 0.0 0.0 - 0.2 %   Neutrophils Relative % 77 %   Neutro Abs 6.4 1.7 - 7.7 K/uL   Lymphocytes Relative 16 %  Lymphs Abs 1.4 0.7 - 4.0 K/uL   Monocytes Relative 7 %   Monocytes Absolute 0.6 0.1 - 1.0 K/uL   Eosinophils Relative 0 %   Eosinophils Absolute 0.0 0.0 - 0.5 K/uL   Basophils Relative 0 %   Basophils Absolute 0.0 0.0 - 0.1 K/uL   Immature Granulocytes 0 %   Abs Immature Granulocytes 0.03 0.00 - 0.07 K/uL    Comment: Performed at [facility], [address], [place], [place] [address]  Ethanol     Status: None   Collection Time: 12/05/19 12:47 PM  Result Value Ref Range   Alcohol, Ethyl (B) <10 <10 mg/dL    Comment: (NOTE) Lowest detectable limit for serum alcohol is 10 mg/dL. For medical purposes only. Performed at [facility], [address] [place], [place] [address]   Protime-INR     Status: None   Collection Time: 12/05/19 12:47 PM  Result Value Ref Range   Prothrombin Time 11.9 11.4 - 15.2 seconds   INR 0.9 0.8 - 1.2    Comment: (NOTE) INR goal varies based on device and disease states. Performed at [facility], [address] [place], [place] [address]   Type and screen [facility]     Status: None   Collection Time: 12/05/19 12:47 PM  Result Value Ref Range   ABO/RH(D) O POS    Antibody Screen NEG    Sample Expiration      12/08/2019,2359 Performed at [facility], [address] [place], [place] [address]   ABO/Rh     Status: None   Collection Time: 12/05/19 12:49 PM  Result Value Ref Range   ABO/RH(D)      O POS Performed at [facility], [address] [place], [place] [address]   CBG monitoring, ED     Status: Abnormal   Collection Time: 12/05/19  2:38 PM  Result Value Ref Range   Glucose-Capillary 362 (H) 70 - 99 mg/dL    Comment: Glucose reference range applies only to samples taken after fasting for at least 8 hours.    DG Chest 1 View  Result Date: 12/05/2019 CLINICAL DATA:  Fall. EXAM: CHEST  1 VIEW COMPARISON:  Chest x-ray dated Dec 01, 2017. FINDINGS: The heart size and mediastinal contours are within normal limits. Both lungs are clear. The visualized skeletal structures are unremarkable. IMPRESSION: No active disease. Electronically Signed   By: Obie Dredge M.D.   On: 12/05/2019 14:03   DG Pelvis 1-2 Views  Result Date: 12/05/2019 CLINICAL DATA:  Fall. EXAM: PELVIS - 1-2 VIEW COMPARISON:  CT a chest, abdomen, and pelvis dated July 31, 2017. FINDINGS: There is no evidence of pelvic fracture or diastasis. No pelvic bone lesions are seen. Chronic soft tissue calcification lateral to the right acetabulum. IMPRESSION: No acute osseous abnormality. Electronically Signed   By: Obie Dredge M.D.   On: 12/05/2019 14:00   DG Elbow Complete Left  Result Date: 12/05/2019 CLINICAL DATA:  Fall. EXAM: LEFT ELBOW - COMPLETE 3+ VIEW COMPARISON:  None. FINDINGS: There is no evidence of fracture, dislocation, or joint effusion. There is no evidence of arthropathy or other focal bone abnormality. Triceps enthesophyte. Soft tissues are unremarkable. IMPRESSION: Negative. Electronically Signed   By: Obie Dredge M.D.   On: 12/05/2019  14:03   CT Chest W Contrast  Result Date: 12/05/2019 CLINICAL DATA:  Abdominal trauma, fall from ladder Tuesday EXAM: CT CHEST,  ABDOMEN, AND PELVIS WITH CONTRAST TECHNIQUE: Multidetector CT imaging of the chest, abdomen and pelvis was performed following the standard protocol during bolus administration of intravenous contrast. CONTRAST:  125mL OMNIPAQUE IOHEXOL 300 MG/ML  SOLN COMPARISON:  CT angiogram chest abdomen pelvis, 07/30/2017 FINDINGS: CT CHEST FINDINGS Cardiovascular: No significant vascular findings. Normal heart size. No pericardial effusion. Mediastinum/Nodes: No enlarged mediastinal, hilar, or axillary lymph nodes. Thyroid gland, trachea, and esophagus demonstrate no significant findings. Lungs/Pleura: Trace left pleural effusion associated atelectasis or consolidation. Musculoskeletal: No chest wall mass. Minimally displaced fractures of the posterior left tenth and eleventh ribs (series 3, image 53). CT ABDOMEN PELVIS FINDINGS Hepatobiliary: Hepatomegaly, maximum coronal span 22 cm. Hepatic steatosis. No gallstones, gallbladder wall thickening, or biliary dilatation. Pancreas: Unremarkable. No pancreatic ductal dilatation or surrounding inflammatory changes. Spleen: Normal in size without significant abnormality. Adrenals/Urinary Tract: Adrenal glands are unremarkable. There is a subcapsular hematoma about the left kidney, measuring approximately 3.1 cm in thickness (series 3, image 64). The right kidney is normal. No hydronephrosis. Bladder is unremarkable. Stomach/Bowel: Stomach is within normal limits. The appendix is fluid-filled, measuring up to 1.2 cm in caliber (series 3, image 93). There is a fluid-filled lesion at the right cecal base adjacent to the appendiceal os measuring 2.8 x 2.3 cm (series 3, image 91). No evidence of bowel wall thickening, distention, or inflammatory changes. Vascular/Lymphatic: No significant vascular findings are present. No enlarged abdominal or pelvic lymph  nodes. Reproductive: No mass or other abnormality. Other: No abdominal wall hernia or abnormality. No abdominopelvic ascites. Small left retroperitoneal hematoma measuring up to 1.3 cm in thickness (series 3, image 93). Musculoskeletal: No acute or significant osseous findings. IMPRESSION: 1. Minimally displaced fractures of the posterior left tenth and eleventh ribs. Trace left pleural effusion with associated atelectasis or consolidation. 2. There is a subcapsular hematoma about the left kidney, measuring approximately 3.1 cm in thickness. 3. Small adjacent left retroperitoneal hematoma measuring up to 1.3 cm in thickness. 4. The appendix is fluid-filled, measuring up to 1.2 cm in caliber. There is a fluid-filled lesion at the right cecal base adjacent to the appendiceal os measuring 2.8 x 2.3 cm. 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 Recommend nonemergent surgical consultation due to malignant potential. 5. Hepatomegaly and hepatic steatosis. These results were called by telephone at the time of interpretation on 12/05/2019 at 4:46 pm to R[place] , who verbally acknowledged these results. Electronically Signed   By: Eddie Candle M.D.   On: 12/05/2019 16:50   CT ABDOMEN PELVIS W CONTRAST  Result Date: 12/05/2019 CLINICAL DATA:  Abdominal trauma, fall from ladder Tuesday EXAM: CT CHEST, ABDOMEN, AND PELVIS WITH CONTRAST TECHNIQUE: Multidetector CT imaging of the chest, abdomen and pelvis was performed following the standard protocol during bolus administration of intravenous contrast. CONTRAST:  15mL OMNIPAQUE IOHEXOL 300 MG/ML  SOLN COMPARISON:  CT angiogram chest abdomen pelvis, 07/30/2017 FINDINGS: CT CHEST FINDINGS Cardiovascular: No significant vascular findings. Normal heart size. No pericardial effusion. Mediastinum/Nodes: No enlarged mediastinal, hilar, or axillary lymph nodes. Thyroid gland, trachea, and esophagus demonstrate no  significant findings. Lungs/Pleura: Trace left pleural effusion associated atelectasis or consolidation. Musculoskeletal: No chest wall mass. Minimally displaced fractures of the posterior left tenth and eleventh ribs (series 3, image 53). CT ABDOMEN PELVIS FINDINGS Hepatobiliary: Hepatomegaly, maximum coronal span 22 cm. Hepatic steatosis. No gallstones, gallbladder wall thickening, or biliary dilatation. Pancreas: Unremarkable. No pancreatic ductal dilatation or surrounding inflammatory changes. Spleen: Normal  in size without significant abnormality. Adrenals/Urinary Tract: Adrenal glands are unremarkable. There is a subcapsular hematoma about the left kidney, measuring approximately 3.1 cm in thickness (series 3, image 64). The right kidney is normal. No hydronephrosis. Bladder is unremarkable. Stomach/Bowel: Stomach is within normal limits. The appendix is fluid-filled, measuring up to 1.2 cm in caliber (series 3, image 93). There is a fluid-filled lesion at the right cecal base adjacent to the appendiceal os measuring 2.8 x 2.3 cm (series 3, image 91). No evidence of bowel wall thickening, distention, or inflammatory changes. Vascular/Lymphatic: No significant vascular findings are present. No enlarged abdominal or pelvic lymph nodes. Reproductive: No mass or other abnormality. Other: No abdominal wall hernia or abnormality. No abdominopelvic ascites. Small left retroperitoneal hematoma measuring up to 1.3 cm in thickness (series 3, image 93). Musculoskeletal: No acute or significant osseous findings. IMPRESSION: 1. Minimally displaced fractures of the posterior left tenth and eleventh ribs. Trace left pleural effusion with associated atelectasis or consolidation. 2. There is a subcapsular hematoma about the left kidney, measuring approximately 3.1 cm in thickness. 3. Small adjacent left retroperitoneal hematoma measuring up to 1.3 cm in thickness. 4. The appendix is fluid-filled, measuring up to 1.2 cm in  caliber. There is a fluid-filled lesion at the right cecal base adjacent to the appendiceal os measuring 2.8 x 2.3 cm. 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 Recommend nonemergent surgical consultation due to malignant potential. 5. Hepatomegaly and hepatic steatosis. These results were called by telephone at the time of interpretation on 12/05/2019 at 4:46 pm to PA [facility] , who verbally acknowledged these results. Electronically Signed   By: Lauralyn Primes M.D.   On: 12/05/2019 16:50   DG Knee Complete 4 Views Left  Result Date: 12/05/2019 CLINICAL DATA:  Fall. EXAM: LEFT KNEE - COMPLETE 4+ VIEW COMPARISON:  None. FINDINGS: No evidence of fracture, dislocation, or joint effusion. No evidence of arthropathy or other focal bone abnormality. Soft tissues are unremarkable. IMPRESSION: Negative. Electronically Signed   By: Obie Dredge M.D.   On: 12/05/2019 14:01   DG Knee Complete 4 Views Right  Result Date: 12/05/2019 CLINICAL DATA:  Fall. EXAM: RIGHT KNEE - COMPLETE 4+ VIEW COMPARISON:  None. FINDINGS: No evidence of fracture, dislocation, or joint effusion. No evidence of arthropathy or other focal bone abnormality. Soft tissues are unremarkable. IMPRESSION: Negative. Electronically Signed   By: Obie Dredge M.D.   On: 12/05/2019 14:00    Review of Systems  Constitutional: Negative for chills and fever.  HENT: Negative for hearing loss.   Eyes: Negative for blurred vision and double vision.  Respiratory: Positive for shortness of breath. Negative for cough and hemoptysis.   Cardiovascular: Positive for chest pain. Negative for palpitations.  Gastrointestinal: Positive for abdominal pain. Negative for nausea and vomiting.  Genitourinary: Negative for dysuria and urgency.  Musculoskeletal: Negative for myalgias and neck pain.  Skin: Negative for itching and rash.  Neurological: Negative for dizziness, tingling and  headaches.  Endo/Heme/Allergies: Does not bruise/bleed easily.  Psychiatric/Behavioral: Negative for depression and suicidal ideas.    PE Blood pressure (!) 133/107, pulse 94, temperature 99 F (37.2 C), temperature source Oral, resp. rate 16, height 5\' 4"  (1.626 m), weight 89.8 kg, SpO2 99 %. Constitutional: NAD; conversant; noe deformities Eyes: Moist conjunctiva; no lid lag; anicteric; PERRL Neck: Trachea midline; no thyromegaly, no lymphadenopathy Lungs: Normal respiratory effort; no tactile fremitus CV: RRR; no palpable thrills; no pitting edema  GI: Abd soft, tender to palpation on left side, ecchymosis of left back; no palpable hepatosplenomegaly MSK: unable to assess gait; no clubbing/cyanosis Psychiatric: Appropriate affect; alert and oriented x3 Lymphatic: No palpable cervical or axillary lymphadenopathy   Assessment/Plan: Subcapsular hematoma of kidney -observe -repeat blood work in the morning  2 rib fractures -IS and deep breathing -pain control  Diabetes with hyperglycemia -untreated for 6 months -SSI tonight -diabetic coordinator for planning tomorrow  Procedures: none  De BlanchLuke Aaron Akita Maxim 12/05/2019, 5:12 PM

## 2019-12-05 NOTE — ED Notes (Signed)
pt transported to CT 

## 2019-12-05 NOTE — Progress Notes (Signed)
Report received 

## 2019-12-05 NOTE — ED Notes (Signed)
Checked patient cbg it was 65 notified RN patient is resting with call bell

## 2019-12-05 NOTE — ED Triage Notes (Signed)
Pt reports falling off a ladder Tuesday while at work (about 12 ft) pt c.o left sided rib pain, back and bilateral knee pain. Denies hitting his head or LOC. Significant amount of bruising to left flank and back. Redness to bilateral knees.

## 2019-12-05 NOTE — Progress Notes (Signed)
   12/05/19 2249  Assess: MEWS Score  Temp (!) 100.7 F (38.2 C)  BP 135/90  Pulse Rate (!) 103  Resp (!) 22  Level of Consciousness Alert  SpO2 100 %  O2 Device Room Air  Assess: MEWS Score  MEWS Temp 1  MEWS Systolic 0  MEWS Pulse 1  MEWS RR 1  MEWS LOC 0  MEWS Score 3  MEWS Score Color Yellow  Assess: if the MEWS score is Yellow or Red  Were vital signs taken at a resting state? Yes  Focused Assessment Documented focused assessment  Early Detection of Sepsis Score *See Row Information* Low  MEWS guidelines implemented *See Row Information* Yes  Treat  MEWS Interventions Administered prn meds/treatments  Take Vital Signs  Increase Vital Sign Frequency  Yellow: Q 2hr X 2 then Q 4hr X 2, if remains yellow, continue Q 4hrs  Escalate  MEWS: Escalate Yellow: discuss with charge nurse/RN and consider discussing with provider and RRT  Notify: Charge Nurse/RN  Name of Charge Nurse/RN Notified Charito, RN  Date Charge Nurse/RN Notified 12/05/19  Time Charge Nurse/RN Notified 2256   New Admission Note:  Arrival Method: Stretcher from ED Mental Orientation:  A&Ox4 Telemetry: Box # 3 MEWS: Yellow (3) Assessment: Completed Skin: Left flank bruise, left elbow abrasion IV: PIV Left AC NSL Pain: 8/10 Tubes: None Safety Measures: Safety Fall Prevention Plan was discussed  Admission: Initiated Belongings: Clothing in bag at bedside Unit Orientation: Patient has been orientated to the room, unit, and the staff.  Orders have been reviewed and implemented. Call light has been placed within reach and bed alarm has been activated. Will continue to monitor the patient.  Nancy Marus, RN

## 2019-12-05 NOTE — ED Notes (Signed)
ED TO INPATIENT HANDOFF REPORT  ED Nurse Name and Phone #: [phone] Wendie Simmer., RN  S Name/Age/Gender Jacob Schneider 44 y.o. male Room/Bed: 041C/041C  Code Status   Code Status: Full Code  Home/SNF/Other Home Patient oriented to: self, place, time and situation Is this baseline? Yes   Triage Complete: Triage complete  Chief Complaint Renal hematoma [S37.019A]  Triage Note Pt reports falling off a ladder Tuesday while at work (about 12 ft) pt c.o left sided rib pain, back and bilateral knee pain. Denies hitting his head or LOC. Significant amount of bruising to left flank and back. Redness to bilateral knees.     Allergies No Known Allergies  Level of Care/Admitting Diagnosis ED Disposition    ED Disposition Condition Comment   Admit  Hospital Area: [facility] [[id number]]  Level of Care: Med-Surg [16]  Covid Evaluation: Asymptomatic Screening Protocol (No Symptoms)  Diagnosis: Renal hematoma [454098]  Admitting Physician: TRAUMA MD [[id number]]  Attending Physician: TRAUMA MD [[id number]]       B Medical/Surgery History Past Medical History:  Diagnosis Date  . Anxiety   . Chronic lower back pain   . Diabetes mellitus type II, uncontrolled (HCC)    "I think it's type 1" (07/31/2017)  . DKA (diabetic ketoacidoses) (HCC) 07/30/2017  . History of stomach ulcers   . Hypertension    Past Surgical History:  Procedure Laterality Date  . BACK SURGERY    . LUMBAR DISC SURGERY  2004     A IV Location/Drains/Wounds Patient Lines/Drains/Airways Status   Active Line/Drains/Airways    Name:   Placement date:   Placement time:   Site:   Days:   Peripheral IV 12/05/19 Left Antecubital   12/05/19    1249    Antecubital   less than 1          Intake/Output Last 24 hours  Intake/Output Summary (Last 24 hours) at 12/05/2019 2207 Last data filed at 12/05/2019 1828 Gross per 24 hour  Intake 2000 ml  Output 1000 ml  Net 1000 ml    Labs/Imaging Results for  orders placed or performed during the hospital encounter of 12/05/19 (from the past 48 hour(s))  Comprehensive metabolic panel     Status: Abnormal   Collection Time: 12/05/19 12:47 PM  Result Value Ref Range   Sodium 129 (L) 135 - 145 mmol/L   Potassium 4.8 3.5 - 5.1 mmol/L   Chloride 92 (L) 98 - 111 mmol/L   CO2 23 22 - 32 mmol/L   Glucose, Bld 489 (H) 70 - 99 mg/dL    Comment: Glucose reference range applies only to samples taken after fasting for at least 8 hours.   BUN 22 (H) 6 - 20 mg/dL   Creatinine, Ser 1.19 0.61 - 1.24 mg/dL   Calcium 9.6 8.9 - 14.7 mg/dL   Total Protein 7.1 6.5 - 8.1 g/dL   Albumin 3.8 3.5 - 5.0 g/dL   AST 31 15 - 41 U/L   ALT 27 0 - 44 U/L   Alkaline Phosphatase 103 38 - 126 U/L   Total Bilirubin 1.3 (H) 0.3 - 1.2 mg/dL   GFR calc non Af Amer >60 >60 mL/min   GFR calc Af Amer >60 >60 mL/min   Anion gap 14 5 - 15    Comment: Performed at [facility], [address] [place], [place] [address]  CBC with Differential     Status: Abnormal   Collection Time: 12/05/19 12:47  PM  Result Value Ref Range   WBC 8.4 4.0 - 10.5 K/uL   RBC 4.58 4.22 - 5.81 MIL/uL   Hemoglobin 12.5 (L) 13.0 - 17.0 g/dL   HCT 16.1 (L) 09.6 - 04.5 %   MCV 83.0 80.0 - 100.0 fL   MCH 27.3 26.0 - 34.0 pg   MCHC 32.9 30.0 - 36.0 g/dL   RDW 40.9 81.1 - 91.4 %   Platelets 214 150 - 400 K/uL   nRBC 0.0 0.0 - 0.2 %   Neutrophils Relative % 77 %   Neutro Abs 6.4 1.7 - 7.7 K/uL   Lymphocytes Relative 16 %   Lymphs Abs 1.4 0.7 - 4.0 K/uL   Monocytes Relative 7 %   Monocytes Absolute 0.6 0.1 - 1.0 K/uL   Eosinophils Relative 0 %   Eosinophils Absolute 0.0 0.0 - 0.5 K/uL   Basophils Relative 0 %   Basophils Absolute 0.0 0.0 - 0.1 K/uL   Immature Granulocytes 0 %   Abs Immature Granulocytes 0.03 0.00 - 0.07 K/uL    Comment: Performed at [facility], [address] [place], [place] [address]  Ethanol     Status: None   Collection Time: 12/05/19 12:47 PM  Result Value Ref  Range   Alcohol, Ethyl (B) <10 <10 mg/dL    Comment: (NOTE) Lowest detectable limit for serum alcohol is 10 mg/dL. For medical purposes only. Performed at [facility], [address] [place], [place] [address]   Protime-INR     Status: None   Collection Time: 12/05/19 12:47 PM  Result Value Ref Range   Prothrombin Time 11.9 11.4 - 15.2 seconds   INR 0.9 0.8 - 1.2    Comment: (NOTE) INR goal varies based on device and disease states. Performed at [facility], [address] [place], [place] [address]   Type and screen [facility]     Status: None   Collection Time: 12/05/19 12:47 PM  Result Value Ref Range   ABO/RH(D) O POS    Antibody Screen NEG    Sample Expiration      12/08/2019,2359 Performed at [facility], [address], [place], [place] [address]   ABO/Rh     Status: None   Collection Time: 12/05/19 12:49 PM  Result Value Ref Range   ABO/RH(D)      O POS Performed at [facility], [address], [place], [place] [address]   CBG monitoring, ED     Status: Abnormal   Collection Time: 12/05/19  2:38 PM  Result Value Ref Range   Glucose-Capillary 362 (H) 70 - 99 mg/dL    Comment: Glucose reference range applies only to samples taken after fasting for at least 8 hours.  Urinalysis, Routine w reflex microscopic     Status: Abnormal   Collection Time: 12/05/19  5:10 PM  Result Value Ref Range   Color, Urine STRAW (A) YELLOW   APPearance CLEAR CLEAR   Specific Gravity, Urine 1.037 (H) 1.005 - 1.030   pH 6.0 5.0 - 8.0   Glucose, UA >=500 (A) NEGATIVE mg/dL   Hgb urine dipstick NEGATIVE NEGATIVE   Bilirubin Urine NEGATIVE NEGATIVE   Ketones, ur 20 (A) NEGATIVE mg/dL   Protein, ur NEGATIVE NEGATIVE mg/dL   Nitrite NEGATIVE NEGATIVE   Leukocytes,Ua NEGATIVE NEGATIVE   RBC / HPF 0-5 0 - 5 RBC/hpf   WBC, UA 0-5 0 - 5 WBC/hpf  Bacteria, UA NONE SEEN NONE SEEN    Comment: Performed at [facility], [address], [place], [place] [address]  Hemoglobin and hematocrit, blood     Status: Abnormal   Collection Time: 12/05/19  5:13 PM  Result Value Ref Range   Hemoglobin 10.0 (L) 13.0 - 17.0 g/dL   HCT 00.3 (L) 70.4 - 88.8 %    Comment: Performed at [facility], [address], [place], [place] [address]  CBG monitoring, ED     Status: Abnormal   Collection Time: 12/05/19  5:25 PM  Result Value Ref Range   Glucose-Capillary 233 (H) 70 - 99 mg/dL    Comment: Glucose reference range applies only to samples taken after fasting for at least 8 hours.   Comment 1 Notify RN    Comment 2 Document in Chart   Hemoglobin A1c     Status: Abnormal   Collection Time: 12/05/19  5:35 PM  Result Value Ref Range   Hgb A1c MFr Bld 12.3 (H) 4.8 - 5.6 %    Comment: (NOTE) Pre diabetes:          5.7%-6.4% Diabetes:              >6.4% Glycemic control for   <7.0% adults with diabetes    Mean Plasma Glucose 306.31 mg/dL    Comment: Performed at [facility], [address], [place], [place] [address]  CBG monitoring, ED     Status: Abnormal   Collection Time: 12/05/19  8:19 PM  Result Value Ref Range   Glucose-Capillary 166 (H) 70 - 99 mg/dL    Comment: Glucose reference range applies only to samples taken after fasting for at least 8 hours.   Comment 1 Notify RN    Comment 2 Document in Chart   CBG monitoring, ED     Status: Abnormal   Collection Time: 12/05/19  9:20 PM  Result Value Ref Range   Glucose-Capillary 140 (H) 70 - 99 mg/dL    Comment: Glucose reference range applies only to samples taken after fasting for at least 8 hours.   Comment 1 Notify RN    Comment 2 Document in Chart   Hemoglobin and hematocrit, blood     Status: Abnormal   Collection Time: 12/05/19  9:39 PM  Result Value Ref Range   Hemoglobin 10.1 (L) 13.0 - 17.0 g/dL   HCT 82.8 (L) 00.3 - 49.1 %    Comment: Performed at [facility], [address] [place], [place] [address]   DG Chest 1 View  Result Date:  12/05/2019 CLINICAL DATA:  Fall. EXAM: CHEST  1 VIEW COMPARISON:  Chest x-ray dated Dec 01, 2017. FINDINGS: The heart size and mediastinal contours are within normal limits. Both lungs are clear. The visualized skeletal structures are unremarkable. IMPRESSION: No active disease. Electronically Signed   By: Obie Dredge M.D.   On: 12/05/2019 14:03   DG Pelvis 1-2 Views  Result Date: 12/05/2019 CLINICAL DATA:  Fall. EXAM: PELVIS - 1-2 VIEW COMPARISON:  CT a chest, abdomen, and pelvis dated July 31, 2017. FINDINGS: There is no evidence of pelvic fracture or diastasis. No pelvic bone lesions are seen. Chronic soft tissue calcification lateral to the right acetabulum. IMPRESSION: No acute osseous abnormality. Electronically Signed   By: Obie Dredge M.D.   On: 12/05/2019 14:00   DG Elbow Complete Left  Result Date: 12/05/2019 CLINICAL DATA:  Fall. EXAM: LEFT ELBOW -  COMPLETE 3+ VIEW COMPARISON:  None. FINDINGS: There is no evidence of fracture, dislocation, or joint effusion. There is no evidence of arthropathy or other focal bone abnormality. Triceps enthesophyte. Soft tissues are unremarkable. IMPRESSION: Negative. Electronically Signed   By: Titus Dubin M.D.   On: 12/05/2019 14:03   CT Chest W Contrast  Result Date: 12/05/2019 CLINICAL DATA:  Abdominal trauma, fall from ladder Tuesday EXAM: CT CHEST, ABDOMEN, AND PELVIS WITH CONTRAST TECHNIQUE: Multidetector CT imaging of the chest, abdomen and pelvis was performed following the standard protocol during bolus administration of intravenous contrast. CONTRAST:  123mL OMNIPAQUE IOHEXOL 300 MG/ML  SOLN COMPARISON:  CT angiogram chest abdomen pelvis, 07/30/2017 FINDINGS: CT CHEST FINDINGS Cardiovascular: No significant vascular findings. Normal heart size. No pericardial effusion. Mediastinum/Nodes: No enlarged mediastinal, hilar, or axillary lymph nodes. Thyroid gland, trachea, and esophagus demonstrate no significant findings. Lungs/Pleura: Trace  left pleural effusion associated atelectasis or consolidation. Musculoskeletal: No chest wall mass. Minimally displaced fractures of the posterior left tenth and eleventh ribs (series 3, image 53). CT ABDOMEN PELVIS FINDINGS Hepatobiliary: Hepatomegaly, maximum coronal span 22 cm. Hepatic steatosis. No gallstones, gallbladder wall thickening, or biliary dilatation. Pancreas: Unremarkable. No pancreatic ductal dilatation or surrounding inflammatory changes. Spleen: Normal in size without significant abnormality. Adrenals/Urinary Tract: Adrenal glands are unremarkable. There is a subcapsular hematoma about the left kidney, measuring approximately 3.1 cm in thickness (series 3, image 64). The right kidney is normal. No hydronephrosis. Bladder is unremarkable. Stomach/Bowel: Stomach is within normal limits. The appendix is fluid-filled, measuring up to 1.2 cm in caliber (series 3, image 93). There is a fluid-filled lesion at the right cecal base adjacent to the appendiceal os measuring 2.8 x 2.3 cm (series 3, image 91). No evidence of bowel wall thickening, distention, or inflammatory changes. Vascular/Lymphatic: No significant vascular findings are present. No enlarged abdominal or pelvic lymph nodes. Reproductive: No mass or other abnormality. Other: No abdominal wall hernia or abnormality. No abdominopelvic ascites. Small left retroperitoneal hematoma measuring up to 1.3 cm in thickness (series 3, image 93). Musculoskeletal: No acute or significant osseous findings. IMPRESSION: 1. Minimally displaced fractures of the posterior left tenth and eleventh ribs. Trace left pleural effusion with associated atelectasis or consolidation. 2. There is a subcapsular hematoma about the left kidney, measuring approximately 3.1 cm in thickness. 3. Small adjacent left retroperitoneal hematoma measuring up to 1.3 cm in thickness. 4. The appendix is fluid-filled, measuring up to 1.2 cm in caliber. There is a fluid-filled lesion at the  right cecal base adjacent to the appendiceal os measuring 2.8 x 2.3 cm. 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 Recommend nonemergent surgical consultation due to malignant potential. 5. Hepatomegaly and hepatic steatosis. These results were called by telephone at the time of interpretation on 12/05/2019 at 4:46 pm to [place] , who verbally acknowledged these results. Electronically Signed   By: Eddie Candle M.D.   On: 12/05/2019 16:50   CT ABDOMEN PELVIS W CONTRAST  Result Date: 12/05/2019 CLINICAL DATA:  Abdominal trauma, fall from ladder Tuesday EXAM: CT CHEST, ABDOMEN, AND PELVIS WITH CONTRAST TECHNIQUE: Multidetector CT imaging of the chest, abdomen and pelvis was performed following the standard protocol during bolus administration of intravenous contrast. CONTRAST:  135mL OMNIPAQUE IOHEXOL 300 MG/ML  SOLN COMPARISON:  CT angiogram chest abdomen pelvis, 07/30/2017 FINDINGS: CT CHEST FINDINGS Cardiovascular: No significant vascular findings. Normal heart size. No pericardial effusion. Mediastinum/Nodes: No enlarged mediastinal, hilar, or axillary lymph  nodes. Thyroid gland, trachea, and esophagus demonstrate no significant findings. Lungs/Pleura: Trace left pleural effusion associated atelectasis or consolidation. Musculoskeletal: No chest wall mass. Minimally displaced fractures of the posterior left tenth and eleventh ribs (series 3, image 53). CT ABDOMEN PELVIS FINDINGS Hepatobiliary: Hepatomegaly, maximum coronal span 22 cm. Hepatic steatosis. No gallstones, gallbladder wall thickening, or biliary dilatation. Pancreas: Unremarkable. No pancreatic ductal dilatation or surrounding inflammatory changes. Spleen: Normal in size without significant abnormality. Adrenals/Urinary Tract: Adrenal glands are unremarkable. There is a subcapsular hematoma about the left kidney, measuring approximately 3.1 cm in thickness (series 3, image 64).  The right kidney is normal. No hydronephrosis. Bladder is unremarkable. Stomach/Bowel: Stomach is within normal limits. The appendix is fluid-filled, measuring up to 1.2 cm in caliber (series 3, image 93). There is a fluid-filled lesion at the right cecal base adjacent to the appendiceal os measuring 2.8 x 2.3 cm (series 3, image 91). No evidence of bowel wall thickening, distention, or inflammatory changes. Vascular/Lymphatic: No significant vascular findings are present. No enlarged abdominal or pelvic lymph nodes. Reproductive: No mass or other abnormality. Other: No abdominal wall hernia or abnormality. No abdominopelvic ascites. Small left retroperitoneal hematoma measuring up to 1.3 cm in thickness (series 3, image 93). Musculoskeletal: No acute or significant osseous findings. IMPRESSION: 1. Minimally displaced fractures of the posterior left tenth and eleventh ribs. Trace left pleural effusion with associated atelectasis or consolidation. 2. There is a subcapsular hematoma about the left kidney, measuring approximately 3.1 cm in thickness. 3. Small adjacent left retroperitoneal hematoma measuring up to 1.3 cm in thickness. 4. The appendix is fluid-filled, measuring up to 1.2 cm in caliber. There is a fluid-filled lesion at the right cecal base adjacent to the appendiceal os measuring 2.8 x 2.3 cm. 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 Recommend nonemergent surgical consultation due to malignant potential. 5. Hepatomegaly and hepatic steatosis. These results were called by telephone at the time of interpretation on 12/05/2019 at 4:46 pm to [facility] , who verbally acknowledged these results. Electronically Signed   By: Lauralyn PrimesAlex  Bibbey M.D.   On: 12/05/2019 16:50   DG Knee Complete 4 Views Left  Result Date: 12/05/2019 CLINICAL DATA:  Fall. EXAM: LEFT KNEE - COMPLETE 4+ VIEW COMPARISON:  None. FINDINGS: No evidence of fracture,  dislocation, or joint effusion. No evidence of arthropathy or other focal bone abnormality. Soft tissues are unremarkable. IMPRESSION: Negative. Electronically Signed   By: Obie DredgeWilliam T Derry M.D.   On: 12/05/2019 14:01   DG Knee Complete 4 Views Right  Result Date: 12/05/2019 CLINICAL DATA:  Fall. EXAM: RIGHT KNEE - COMPLETE 4+ VIEW COMPARISON:  None. FINDINGS: No evidence of fracture, dislocation, or joint effusion. No evidence of arthropathy or other focal bone abnormality. Soft tissues are unremarkable. IMPRESSION: Negative. Electronically Signed   By: Obie DredgeWilliam T Derry M.D.   On: 12/05/2019 14:00    Pending Labs Unresulted Labs (From admission, onward)    Start     Ordered   12/06/19 0500  CBC  Tomorrow morning,   R     12/05/19 2034   12/06/19 0500  Basic metabolic panel  Tomorrow morning,   R     12/05/19 2034   12/05/19 2033  HIV Antibody (routine testing w rflx)  (HIV Antibody (Routine testing w reflex) panel)  Once,   STAT     12/05/19 2034   12/05/19 1713  Hemoglobin and hematocrit, blood  Now then every 6  hours,   R     12/05/19 1712          Vitals/Pain Today's Vitals   12/05/19 1722 12/05/19 1730 12/05/19 1825 12/05/19 2130  BP:   128/86   Pulse:  97 (!) 103   Resp:  15 15   Temp:      TempSrc:      SpO2:  100% 95%   Weight:      Height:      PainSc: 7    10-Worst pain ever    Isolation Precautions No active isolations  Medications Medications  insulin aspart (novoLOG) injection 0-20 Units (3 Units Subcutaneous Given 12/05/19 2137)  acetaminophen (TYLENOL) tablet 650 mg (has no administration in time range)  oxyCODONE (Oxy IR/ROXICODONE) immediate release tablet 5 mg (has no administration in time range)  morphine 2 MG/ML injection 2-4 mg (4 mg Intravenous Given 12/05/19 2131)  0.9 %  sodium chloride infusion (has no administration in time range)  ondansetron (ZOFRAN-ODT) disintegrating tablet 4 mg ( Oral See Alternative 12/05/19 2133)    Or  ondansetron (ZOFRAN)  injection 4 mg (4 mg Intravenous Given 12/05/19 2133)  HYDROmorphone (DILAUDID) injection 1 mg (1 mg Intravenous Given 12/05/19 1343)  ondansetron (ZOFRAN) injection 4 mg (4 mg Intravenous Given 12/05/19 1343)  sodium chloride 0.9 % bolus 1,000 mL (0 mLs Intravenous Stopped 12/05/19 1637)  sodium chloride 0.9 % bolus 1,000 mL (0 mLs Intravenous Stopped 12/05/19 1828)  iohexol (OMNIPAQUE) 300 MG/ML solution 100 mL (100 mLs Intravenous Contrast Given 12/05/19 1614)  HYDROmorphone (DILAUDID) injection 1 mg (1 mg Intravenous Given 12/05/19 1647)    Mobility walks     Focused Assessments Cardiac Assessment Handoff:  Cardiac Rhythm: Sinus tachycardia Lab Results  Component Value Date   TROPONINI <0.03 08/01/2017   Lab Results  Component Value Date   DDIMER <0.27 12/02/2017   Does the Patient currently have chest pain? No  , Neuro Assessment Handoff:  Swallow screen pass? N/A Cardiac Rhythm: Sinus tachycardia       Neuro Assessment:   Neuro Checks:      Last Documented NIHSS Modified Score:   Has TPA been given? No If patient is a Neuro Trauma and patient is going to OR before floor call report to 4N Charge nurse: [phone] or [phone]     R Recommendations: See Admitting Provider Note  Report given to:   Additional Notes:  Speaks primarily spanish

## 2019-12-06 ENCOUNTER — Encounter (HOSPITAL_COMMUNITY): Payer: Self-pay

## 2019-12-06 LAB — CBC
HCT: 30.4 % — ABNORMAL LOW (ref 39.0–52.0)
Hemoglobin: 9.9 g/dL — ABNORMAL LOW (ref 13.0–17.0)
MCH: 27.7 pg (ref 26.0–34.0)
MCHC: 32.6 g/dL (ref 30.0–36.0)
MCV: 85.2 fL (ref 80.0–100.0)
Platelets: 157 10*3/uL (ref 150–400)
RBC: 3.57 MIL/uL — ABNORMAL LOW (ref 4.22–5.81)
RDW: 13.2 % (ref 11.5–15.5)
WBC: 7.2 10*3/uL (ref 4.0–10.5)
nRBC: 0 % (ref 0.0–0.2)

## 2019-12-06 LAB — GLUCOSE, CAPILLARY
Glucose-Capillary: 144 mg/dL — ABNORMAL HIGH (ref 70–99)
Glucose-Capillary: 167 mg/dL — ABNORMAL HIGH (ref 70–99)
Glucose-Capillary: 179 mg/dL — ABNORMAL HIGH (ref 70–99)
Glucose-Capillary: 201 mg/dL — ABNORMAL HIGH (ref 70–99)
Glucose-Capillary: 211 mg/dL — ABNORMAL HIGH (ref 70–99)
Glucose-Capillary: 84 mg/dL (ref 70–99)
Glucose-Capillary: 87 mg/dL (ref 70–99)

## 2019-12-06 LAB — BASIC METABOLIC PANEL
Anion gap: 10 (ref 5–15)
BUN: 16 mg/dL (ref 6–20)
CO2: 24 mmol/L (ref 22–32)
Calcium: 8.1 mg/dL — ABNORMAL LOW (ref 8.9–10.3)
Chloride: 99 mmol/L (ref 98–111)
Creatinine, Ser: 0.77 mg/dL (ref 0.61–1.24)
GFR calc Af Amer: >60 mL/min (ref >60)
GFR calc non Af Amer: >60 mL/min (ref >60)
Glucose, Bld: 211 mg/dL — ABNORMAL HIGH (ref 70–99)
Potassium: 4 mmol/L (ref 3.5–5.1)
Sodium: 133 mmol/L — ABNORMAL LOW (ref 135–145)

## 2019-12-06 LAB — SARS CORONAVIRUS 2 BY RT PCR (HOSPITAL ORDER, PERFORMED IN ~~LOC~~ HOSPITAL LAB): SARS Coronavirus 2: NEGATIVE

## 2019-12-06 MED ORDER — GLIMEPIRIDE 4 MG PO TABS
4.0000 mg | ORAL_TABLET | Freq: Every day | ORAL | Status: DC
  Administered 2019-12-06 – 2019-12-07 (×2): 4 mg via ORAL
  Filled 2019-12-06 (×2): qty 1

## 2019-12-06 MED ORDER — METFORMIN HCL 500 MG PO TABS
500.0000 mg | ORAL_TABLET | Freq: Two times a day (BID) | ORAL | Status: DC
  Administered 2019-12-06 – 2019-12-07 (×2): 500 mg via ORAL
  Filled 2019-12-06 (×2): qty 1

## 2019-12-06 MED ORDER — ACETAMINOPHEN 500 MG PO TABS
1000.0000 mg | ORAL_TABLET | Freq: Four times a day (QID) | ORAL | Status: DC
  Administered 2019-12-06 – 2019-12-07 (×5): 1000 mg via ORAL
  Filled 2019-12-06 (×5): qty 2

## 2019-12-06 NOTE — Progress Notes (Addendum)
Inpatient Diabetes Program Recommendations  AACE/ADA: New Consensus Statement on Inpatient Glycemic Control (2015)  Target Ranges:  Prepandial:   less than 140 mg/dL      Peak postprandial:   less than 180 mg/dL (1-2 hours)      Critically ill patients:  140 - 180 mg/dL   Lab Results  Component Value Date   GLUCAP 179 (H) 12/06/2019   HGBA1C 12.3 (H) 12/05/2019    Review of Glycemic Control Results for Jacob Schneider, Jacob Schneider (MRN [id number]) as of 12/06/2019 15:11  Ref. Range 12/06/2019 00:59 12/06/2019 06:10 12/06/2019 07:24 12/06/2019 11:28  Glucose-Capillary Latest Ref Range: 70 - 99 mg/dL 591 (H) 638 (H) 466 (H) 179 (H)   Diabetes history: Type 2 DM Outpatient Diabetes medications: Novolin 70/30 10 units BID, Metformin 500 mg BID Current orders for Inpatient glycemic control: Metformin 500 mg BID, Amaryl 4 mg QAM, Novolog 0-20 units Q4H  Inpatient Diabetes Program Recommendations:    When patient able to tolerate oral intake, consider: -Novolog 0-9 units TID -Novolog 70/30 8 units BID  Spoke with patient, using interpreter regarding outpatient diabetes management. Patient denies taking home medications in last 6 months, reports quitting because he struggled to get transportation to [employer].  Reviewed patient's current A1c of 12.3%. Explained what a A1c is and what it measures. Also reviewed goal A1c with patient, importance of good glucose control @ home, and blood sugar goals. Reviewed patho of DM, need for insulin, role of pancreas, vascular changes and commorbidities.  Patient has a meter but was not using it. Encouraged that once he obtains his insulin, reviewed frequency for bid checks.  Reviewed plate method, importance of being mindful with carb intake and eliminating sugary beverages.  Also, reviewed [employer] information and products. Attached information to DC summary. TOC consult placed to help aide in PCP appointment and transportation resources.  Thanks, Lujean Rave, MSN,  RNC-OB Diabetes Coordinator [phone] (8a-5p)

## 2019-12-06 NOTE — TOC Initial Note (Signed)
Transition of Care [facility]) - Initial/Assessment Note    Patient Details  Name: Jacob Schneider MRN: [id number] Date of Birth: 1976/04/17  Transition of Care [facility]) CM/SW Contact:    Ella Bodo, RN Phone Number: 12/06/2019, 5:19 PM  Clinical Narrative:   44 y.o. male s/p fall down stairs, fell on left side on 12/03/19, came to hospital on 12/05/19 due to worsening pain. CT showed hemotoma left kidney and ribs 10-11.  PTA, pt independent,lives at home with roommates/friends.  PT recommending no OP follow up.  Pt states he is currently a patient at [facility], and plans to follow up at discharge.  Pt uninsured, but is eligible for medication assistance through [facility] program.  Will provide [place] letter to assist with discharge meds.                Expected Discharge Plan: Home/Self Care Barriers to Discharge: Continued Medical Work up           Expected Discharge Plan and Services Expected Discharge Plan: Home/Self Care   Discharge Planning Services: CM Consult, Medication Assistance   Living arrangements for the past 2 months: Single Family Home                                      Prior Living Arrangements/Services Living arrangements for the past 2 months: Single Family Home Lives with:: Roommate Patient language and need for interpreter reviewed:: Yes Do you feel safe going back to the place where you live?: Yes      Need for Family Participation in Patient Care: Yes (Comment) Care giver support system in place?: Yes (comment)   Criminal Activity/Legal Involvement Pertinent to Current Situation/Hospitalization: No - Comment as needed  Activities of Daily Living Home Assistive Devices/Equipment: None ADL Screening (condition at time of admission) Patient's cognitive ability adequate to safely complete daily activities?: Yes Is the patient deaf or have difficulty hearing?: No Does the patient have difficulty seeing, even when  wearing glasses/contacts?: No Does the patient have difficulty concentrating, remembering, or making decisions?: No Patient able to express need for assistance with ADLs?: Yes Does the patient have difficulty dressing or bathing?: No Independently performs ADLs?: Yes (appropriate for developmental age) Does the patient have difficulty walking or climbing stairs?: Yes Weakness of Legs: None Weakness of Arms/Hands: None  Permission Sought/Granted                  Emotional Assessment Appearance:: Appears stated age Attitude/Demeanor/Rapport: Engaged Affect (typically observed): Accepting Orientation: : Oriented to Self, Oriented to Place, Oriented to  Time, Oriented to Situation      Admission diagnosis:  Trauma [T14.90XA] Renal hematoma [S37.019A] Closed fracture of multiple ribs of left side, initial encounter [S22.42XA] Hyperglycemia due to diabetes mellitus ([place]) [E11.65] Traumatic hematoma of left kidney [S37.012A] Patient Active Problem List   Diagnosis Date Noted  . Renal hematoma 12/05/2019  . Essential hypertension 08/24/2017  . Neuropathy 08/24/2017  . Hyperlipidemia 08/22/2017  . Noncompliance with medication regimen 08/04/2017  . Alcohol withdrawal syndrome, with delirium ([place]) 07/01/2017  . DKA (diabetic ketoacidoses) ([place]) 07/01/2017  . AKI (acute kidney injury) ([place]) 07/01/2017  . Alcoholic gastritis   . Costochondritis 04/06/2015  . DM (diabetes mellitus) type 2, uncontrolled 04/01/2015  . Hypokalemia 04/01/2015  . Hypomagnesemia 04/01/2015  . Alcohol withdrawal delirium, acute, hyperactive ([place]) 03/30/2015   PCP:  Lanae Boast,  FNP Pharmacy:   [facility] - [place], [place] - [place] [address] [place] [address] Phone: [phone] Fax: [phone]     Social Determinants of Health (SDOH) Interventions    Readmission Risk Interventions No flowsheet data found.  Quintella Baton, RN, BSN   Trauma/Neuro ICU Case Manager [phone]

## 2019-12-06 NOTE — Evaluation (Signed)
Physical Therapy Evaluation Patient Details Name: Jacob Schneider MRN: [id number] DOB: Nov 13, 1975 Today's Date: 12/06/2019   History of Present Illness  44 y.o. male s/p fall down stairs, fell on left side on 12/03/19, came to hospital on 12/05/19 due to worsening pain. CT showed hemotoma left kidney and ribs 10-11.  PMH chronic LBP, DM II, DKA, stomach ulcers, HTN, lumbar disc surgery 2004.  Clinical Impression  Pt presents with an overall decrease in functional mobility, increase in pain, decrease in balance and cardiopulmonary endurance secondary to above. PTA, pt works as Education administrator and lives with friends in one story home. Educ on stratgies to assist with decreaing pain such as using DME until pain subsides and sleeping in recliner. Today, pt able to complete bed mobility up to min (A), transfer and amb min guard. Pt demonstrated increased unsteadiness with amb with and without DME and with one UE supported, pt demonstrated greater steadiness with bilateral UE supported with RW. Discussed d/c home without PT services and use of RW until pain subsides. Pt would benefit from continued acute PT services to maximize functional mobility and independence prior to d/c home.   BP supine 128/100, HR 102 BP EOB 131/89, HR 106    Follow Up Recommendations No PT follow up    Equipment Recommendations  None recommended by PT    Recommendations for Other Services       Precautions / Restrictions Precautions Precautions: Fall Restrictions Weight Bearing Restrictions: No      Mobility  Bed Mobility Overal bed mobility: Needs Assistance Bed Mobility: Rolling;Sidelying to Sit Rolling: Modified independent (Device/Increase time) Sidelying to sit: Min assist       General bed mobility comments: pt required increased time due to pain fro rolling, required min(A) for sidelying to EOB, pt able to scoot EOB mod(I) for increased time.  Transfers Overall transfer level: Needs assistance   Transfers:  Sit to/from Stand Sit to Stand: Min guard         General transfer comment: Pt required min guard for sit to stand due to pain and decreased velocity of movement  Ambulation/Gait Ambulation/Gait assistance: Min guard Gait Distance (Feet): 90 Feet Assistive device: Rolling walker (2 wheeled);IV Pole;None Gait Pattern/deviations: Step-through pattern;Decreased stride length;Decreased weight shift to left;Drifts right/left;Wide base of support Gait velocity: decreased   General Gait Details: Pt walked first 10 feet without walker, with decreased WB on LLE due to pain, increased unsteadiness. Pt amb 75 feet holding IV pole for support, increased steadiness than without IV, but still unstable. Attempted 5 feet amb with RW at end of session, pt much more steady and reported "easier".  Stairs            Wheelchair Mobility    Modified Rankin (Stroke Patients Only)       Balance Overall balance assessment: Needs assistance Sitting-balance support: Bilateral upper extremity supported Sitting balance-Leahy Scale: Good Sitting balance - Comments: Pt able to sit eob and maintain balance, decreased long term tolerance due to pain   Standing balance support: Bilateral upper extremity supported;Single extremity supported;During functional activity Standing balance-Leahy Scale: Fair Standing balance comment: Pt able to stand and amb without RW or single UE support, pt very unsteady and demonstrates greater steadiness and safety with RW                             Pertinent Vitals/Pain Pain Assessment: 0-10 Pain Score: 7  Pain Location: pain on  left side, back and knee. Pain in left side and back greatest. Pain Descriptors / Indicators: Grimacing;Discomfort;Guarding Pain Intervention(s): Limited activity within patient's tolerance;Monitored during session;Repositioned    Home Living Family/patient expects to be discharged to:: Private residence Living Arrangements:  Non-relatives/Friends Available Help at Discharge: Other (Comment)(Pt reports he does not have help at home, all his friends work) Type of Home: House Home Access: Stairs to enter   Secretary/administrator of Steps: 3 Home Layout: One level Home Equipment: Environmental consultant - 2 wheels Additional Comments: pt reports he has used walker before but does not use at baseline    Prior Function Level of Independence: Independent               Hand Dominance        Extremity/Trunk Assessment        Lower Extremity Assessment Lower Extremity Assessment: Overall WFL for tasks assessed    Cervical / Trunk Assessment Cervical / Trunk Assessment: Kyphotic  Communication   Communication: Prefers language other than English;Interpreter utilized(#[id number])  Cognition Arousal/Alertness: Awake/alert Behavior During Therapy: WFL for tasks assessed/performed Overall Cognitive Status: Within Functional Limits for tasks assessed                                        General Comments General comments (skin integrity, edema, etc.): Educated pt on use of RW until pain decreases. Pt independent without DME at baseline and anticipating no need for DME once pain resolves. Educated pt on sleeping and sitting in recliner with potential for decreasing pain.    Exercises     Assessment/Plan    PT Assessment Patient needs continued PT services  PT Problem List Decreased mobility;Decreased activity tolerance;Pain;Decreased balance;Decreased knowledge of use of DME       PT Treatment Interventions DME instruction;Therapeutic activities;Gait training;Therapeutic exercise;Patient/family education;Stair training;Balance training;Functional mobility training    PT Goals (Current goals can be found in the Care Plan section)  Acute Rehab PT Goals Patient Stated Goal: Return home, return to work PT Goal Formulation: With patient Time For Goal Achievement: 12/06/19 Potential to Achieve Goals:  Good    Frequency Min 3X/week   Barriers to discharge Decreased caregiver support Pt reports he does not have caregivers to assist him upon return home    Co-evaluation               AM-PAC PT "6 Clicks" Mobility  Outcome Measure Help needed turning from your back to your side while in a flat bed without using bedrails?: None Help needed moving from lying on your back to sitting on the side of a flat bed without using bedrails?: A Little Help needed moving to and from a bed to a chair (including a wheelchair)?: A Little Help needed standing up from a chair using your arms (e.g., wheelchair or bedside chair)?: A Little Help needed to walk in hospital room?: A Little Help needed climbing 3-5 steps with a railing? : A Little 6 Click Score: 19    End of Session Equipment Utilized During Treatment: Gait belt Activity Tolerance: Patient limited by pain Patient left: in chair;with call bell/phone within reach;with chair alarm set Nurse Communication: Mobility status PT Visit Diagnosis: Unsteadiness on feet (R26.81);Pain Pain - Right/Left: Left Pain - part of body: Knee(back and torso)    Time: 3762-8315 PT Time Calculation (min) (ACUTE ONLY): 31 min   Charges:   PT Evaluation $PT  Eval Low Complexity: 1 Low PT Treatments $Gait Training: 8-22 mins        [employer] SPT 12/06/2019   Rolland Porter 12/06/2019, 11:11 AM

## 2019-12-06 NOTE — Discharge Instructions (Signed)
Hiperglucemia Hyperglycemia La hiperglucemia se produce cuando el nivel de azcar (glucosa) en la sangre es demasiado alto. Puede suceder que no cause sntomas. Si tiene sntomas, estos pueden incluir seales de advertencia, por ejemplo, las siguientes:  Estar ms sediento que lo habitual.  Hambre.  Cansancio.  Necesidad de Garment/textile technologist con mayor frecuencia que lo normal.  Visin borrosa. Si la afeccin empeora, puede presentar otros sntomas, como los siguientes:  [place].  Falta de hambre (inapetencia).  Aliento con USAA a fruta.  Debilidad.  Aumento o prdida de peso no planificados. Probablemente pierda de peso muy rpidamente.  Hormigueo o adormecimiento en las manos o los pies.  Dolor de [place].  Cuando se pellizca la piel, esta no vuelve rpidamente a su lugar al soltarla (turgencia cutnea deficiente).  Dolor en el vientre (abdomen).  Cortes o moretones que tardan en curarse. La hiperglucemia puede presentarse tanto en personas que tienen diabetes como en las que no la tienen. Puede desarrollarse despacio o rpidamente y ser una emergencia mdica. Siga estas indicaciones en su casa: Instrucciones generales  [employer] de venta libre y los recetados solamente como se lo haya indicado el mdico.  No consuma productos que contengan nicotina o tabaco, como cigarrillos y Psychologist, sport and exercise. Si necesita ayuda para dejar de fumar, consulte al mdico.  Limite el consumo de alcohol a no ms de 1 medida por da si es mujer y no est Music therapist, y a 2 medidas si es hombre. Una medida equivale a 12oz (310ml) de cerveza, 5oz (156ml) de vino o 1oz (35ml) de bebidas alcohlicas de alta graduacin.  Controle el estrs. Si necesita ayuda para lograrlo, consulte al mdico.  Concurra a todas las visitas de control como se lo haya indicado el mdico. Esto es importante. Comida y bebida   Mantenga un peso saludable.  Haga ejercicios con regularidad como se  lo haya indicado el mdico.  Beba la cantidad suficiente de lquido, especialmente si: ? Realiza actividad fsica. ? Se enferma. ? El clima es caluroso.  Consuma alimentos saludables, por ejemplo: ? Protenas con bajo contenido de grasa [place]). ? Carbohidratos complejos, como panes integrales o arroz integral. ? Lambert Mody y verduras frescas. ? Productos lcteos descremados. ? Grasas saludables.  Beba suficiente lquido como para mantener el pis (orina) claro o de color amarillo plido. Si usted tiene diabetes:   Asegrese de Charity fundraiser de la hiperglucemia.  Siga el plan de control de la diabetes como se lo haya indicado el mdico. Haga lo siguiente: ? Aplquese la insulina y tome los medicamentos como se lo hayan indicado. ? Siga el plan de ejercicio. ? Siga el plan de comidas. Coma a horario. No se saltee comidas. ? Contrlese el nivel de azcar en la sangre con la frecuencia que le hayan indicado. Contrlese antes y despus de hacer actividad fsica. Si hace ejercicio durante ms tiempo o de [employer] de lo habitual, asegrese de Chief Technology Officer su nivel de azcar en la sangre con mayor frecuencia. ? Siga el plan para los das de enfermedad cuando no pueda comer o beber normalmente. Arme este plan de antemano con el mdico.  Comparta su plan de control de la diabetes con sus compaeros de trabajo y de la escuela, y con las otras personas que viven en su hogar.  Contrlese las cetonas en la orina cuando est enfermo y [place] se lo haya indicado el mdico.  Lleve consigo una tarjeta, o use un brazalete o una medalla que indiquen que es diabtico.  Comunquese con un mdico si:  El nivel de azcar en la sangre est por encima de 240mg /dl ( ) durante 2das seguidos.  Tiene problemas para 26,3FHLK/T de azcar en la sangre dentro del intervalo deseado.  El nivel de azcar en la sangre le aumenta a menudo. Solicite ayuda de inmediato si:  Tiene dificultad  para respirar.  Tiene cambios en la manera de sentirse, pensar o actuar (estado mental).  Tiene ganas constantes de vomitar (nuseas).  No puede dejar de vomitar. Estos sntomas pueden Futures trader. No espere hasta que los sntomas desaparezcan. Solicite atencin mdica de inmediato. Comunquese con el servicio de emergencias de su localidad (911 en los [place]). No conduzca por sus propios medios Customer service manager hospital. Resumen  La hiperglucemia se produce cuando el nivel de azcar (glucosa) en la sangre es demasiado alto.  La hiperglucemia puede presentarse tanto en personas que tienen diabetes como en las que no la tienen.  Asegrese de beber la cantidad suficiente de lquido, de comer alimentos saludables y de hacer actividad fsica con regularidad.  Comunquese con el mdico si tiene problemas para [employer] de azcar en la sangre dentro del intervalo deseado. Esta informacin no tiene Futures trader el consejo del mdico. Asegrese de hacerle al mdico cualquier pregunta que tenga. Document Revised: 02/14/2017 Document Reviewed: 02/24/2015 Elsevier Patient Education  2020 Elsevier Inc.       Anlisis de hemoglobina A1c Hemoglobin A1c Test Por qu me debo realizar esta prueba? Es posible que se le realice el anlisis de hemoglobina A1c (anlisis de HbA1c) para:  02/26/2015 su riesgo de desarrollar diabetes (diabetes mellitus).  Diagnosticar la diabetes.  Controlar Development worker, community (glucosa) a Engineer, agricultural que tienen diabetes y ayudarlas a tomar decisiones respecto de su tratamiento. Este anlisis puede hacerse junto con otros anlisis de glucemia, como los anlisis de glucemia en ayunas y Nurse, mental health a la glucosa oral. Astronomer se analiza? La hemoglobina es un tipo de protena de la sangre que transporta el oxgeno. La glucosa se adhiere a la hemoglobina para formar la hemoglobina glicosilada. Este anlisis controla la cantidad de  hemoglobina glicosilada en sangre, que es un buen indicador de la cantidad promedio de glucosa en sangre en los ltimos 2 a 3 meses. Qu tipo de [place] se toma?  Para este anlisis, se extrae [place] de [place]. Por lo general, para extraerla, se introduce una aguja en un vaso sanguneo. Informe al mdico acerca de lo siguiente:  Todos los [place], incluidos vitaminas, hierbas, gotas oftlmicas, cremas y [employer] de [address]  Cualquier enfermedad de la sangre que tenga.  Cirugas a las que se someti.  Cualquier enfermedad que tenga.  Si est embarazada o podra estarlo. Cmo se informan los resultados? Los [address] se informan como un porcentaje que indica qu cantidad de hemoglobina tiene glucosa adherida a ella (est glicosilada). El mdico comparar sus resultados con los rangos normales que se establecieron luego de [employer] esta prueba a un grupo grande de personas (rangos de referencia). Los rangos de referencia pueden variar entre distintos laboratorios y hospitales. Los rangos de referencia habituales para esta prueba son los siguientes:  Adultos o nios sin diabetes: del 4% al 5,6%.  Adultos o nios con diabetes y un buen control de la glucemia: menos del 7%. Qu significan los resultados? Si usted tiene diabetes:  Un resultado de menos del 7 % se considera normal, lo que significa que su glucemia est bien  controlada.  Un resultado de ms del 7 % significa que su glucemia no est bien controlada y que se debe ajustar su plan de tratamiento. Si usted no tiene diabetes:  Un resultado dentro del valor de referencia se considera normal, lo que significa que no tiene riesgo de desarrollar diabetes.  Un resultado entre 5,7 % y 6,4 % significa que tiene un riesgo alto de desarrollar diabetes y que puede tener prediabetes. La prediabetes es una afeccin que se caracteriza por un nivel de glucemia que es ms alto de lo que debera ser, pero no es lo  suficientemente alto como para que se diagnostique diabetes. El hecho de ser prediabtico lo pone en riesgo de desarrollar diabetes tipo 2 (diabetes mellitus tipo 2). Es posible que se le realicen ms anlisis, incluso que se repita el anlisis de HbA1c.  Los resultados del 6,5 % o ms altos en dos anlisis de HbA1c separados significan que usted tiene diabetes. Pueden hacerle ms anlisis para confirmar el diagnstico. Los valores de HbA1c anormalmente bajos pueden ser provocados por:  Psychiatrist.  Prdida de sangre grave.  Recibir sangre donada (transfusiones).  Valores bajos en el recuento de glbulos rojos (anemia).  Insuficiencia renal a largo plazo.  Algunas formas inusuales (variantes) de la hemoglobina. Hable con el mdico sobre lo que [employer]. Preguntas para hacerle al mdico Consulte a su mdico o pregunte en el departamento donde se realiza la prueba acerca de lo siguiente:  Cundo estarn disponibles mis resultados?  Cmo obtendr mis resultados?  Cules son mis opciones de tratamiento?  Qu otras pruebas necesito?  Cules son los prximos pasos que debo seguir? Resumen  El anlisis de hemoglobina A1c (anlisis de HbA1c) puede hacerse para evaluar su riesgo de desarrollar diabetes, para diagnosticar la diabetes y para Systems developer control a largo plazo del Psychologist, counselling sangre (glucosa) en personas que tienen diabetes y ayudarlas a tomar decisiones respecto al TEFL teacher.  La hemoglobina es un tipo de protena de la sangre que transporta el oxgeno. La glucosa se adhiere a la hemoglobina para formar la hemoglobina glicosilada. Este anlisis controla la cantidad de hemoglobina glicosilada en sangre, que es un buen indicador de la cantidad promedio de glucosa en sangre en los ltimos 2 a 3 meses.  Hable con el mdico sobre lo que [employer]. Esta informacin no tiene Theme park manager el consejo del mdico. Asegrese de hacerle al  mdico cualquier pregunta que tenga. Document Revised: 04/18/2017 Document Reviewed: 04/18/2017 Elsevier Patient Education  2020 [employer].  Indicaciones para aplicar una inyeccin de dosis nica de [place], en adultos Insulin Injection Instructions, Single Insulin Dose, Adult Una inyeccin subcutnea es una inyeccin de medicamentos que se aplica en la capa de grasa y tejido que se encuentra entre la piel y [place]. Las personas con diabetes tipo1 deben inyectarse insulina porque su cuerpo no la producen. Las personas con diabetes tipo 2pueden necesitar inyecciones de insulina.  Hay diferentes tipos de insulina. El tipo de [place] que use determinar cuntas inyecciones deber aplicarse y cundo. Materiales necesarios:  [place] y jabn para lavarse las manos.  Samule Dry para insulina nueva y sin usar.  El frasco con insulina (ampolla).  Paos con alcohol.  Un recipiente para desechar elementos cortopunzantes (recipiente para objetos punzantes), por ejemplo, una botella de plstico vaca con tapa. Cmo elegir la zona para aplicar la inyeccin El cuerpo absorbe la insulina de manera diferente dependiendo de dnde se inyecte (sitio de la inyeccin). Lo  mejor es Scientist, clinical (histocompatibility and immunogenetics) insulina siempre en la misma zona del cuerpo (por ejemplo, siempre en el abdomen), pero usando un lugar diferente de esa zona para cada inyeccin. No debe inyectarse la insulina siempre en el mismo lugar. Hay cinco zonas principales que pueden utilizarse para las inyecciones. Estas zonas son las siguientes:  El abdomen. Esta es la zona ms recomendada.  La cara anterior del muslo.  La cara superior y externa del muslo.  La cara superior y externa del brazo.  La cara superior y externa de la nalga. Cmo aplicar una inyeccin de dosis nica de [employer], siga los pasos que se describen en Preparacin y Air traffic controller contine con los pasos de Empuje aire dentro de la [place]. [place], siga los pasos en [place] la  jeringa y finalice con los pasos para Inyecte la [place]. Preparacin 1. Lvese las manos con agua y [place]. Use desinfectante para manos si no dispone de [place] y [place]. 2. Antes de aplicarse la inyeccin de insulina, no se olvide de controlar su nivel de azcar en la sangre (glucemia) y anotar Visual merchandiser. Siga las indicaciones de su mdico acerca de cmo proceder si su nivel de glucemia es mayor o menor que su rango normal. 3. Use una jeringa para insulina nueva y sin usar cada vez que deba aplicarse la inyeccin de [place]. 4. Asegrese de tener el tipo correcto de [place] para la concentracin de insulina que [place]. 5. Verifique la fecha de vencimiento y el tipo de insulina que [place]. 6. Si va a utilizar [employer], revise que sea clara y no tenga grumos. 7. No agite la ampolla para prepararla. Haga rodar [employer] ampolla entre sus palmas varias veces. 8. Quite la cubierta plstica de la ampolla de insulina. Las [place] este tipo de [place] cuando son nuevas. 9. Limpie el tapn de goma de la ampolla con un pao con alcohol. 10. Retire la cubierta plstica de la aguja de la [place]. No deje que la aguja toque nada. Empuje aire dentro de la ampolla 1. Para llenar la jeringa con aire, tire lentamente del mbolo de la [place]. Deje de tirar del mbolo cuando el indicador de dosis llegue al nmero de unidades que va a usar. 2. Con el lado derecho de la ampolla mirando hacia arriba, introduzca la aguja a travs del tapn de goma de la ampolla. No coloque la ampolla boca abajo para hacer esto. 3. Empuje el mbolo hasta el fondo de la [place]. Al hacerlo, empujar aire hacia dentro de la [place]. 4. No retire la aguja de la ampolla todava. Llene la jeringa  1. Con la aguja an dentro de [employer], gire la ampolla hacia abajo y sostngala a nivel de los ojos. 2. Tire lentamente del mbolo. Deje de tirar del mbolo cuando el indicador de dosis llegue al nmero de unidades que  desea usar. 3. Si nota burbujas de aire en la [place], [place] lentamente el mbolo hacia arriba y [place] 2o 3veces para eliminarlas. ? Si tiene Gaffer para eliminar las burbujas de [place], tire del mbolo hasta que el indicador de dosis vuelva a la dosis Advice worker. 4. Quite la aguja de la ampolla. No deje que la aguja toque nada. Inyecte la insulina  1. Limpie el lugar donde aplicar la inyeccin con un pao con alcohol. Deje que el lugar se seque al aire. 2. Sostenga la jeringa como un lpiz, con la mano con la que escribe. 3. Use la Liliana Cline para pellizcar  y sujetar aproximadamente una pulgada (2,5cm) de piel. No toque directamente la zona limpia de la piel. 4. Inserte la aguja en la piel con cuidado pero rpidamente. La aguja debe estar en un ngulo de 90 grados (perpendicular) a la piel. 5. Empuje la aguja lo ms que se pueda (hasta el cono de la [place]). 6. Cuando la aguja est totalmente insertada en la piel, empuje el mbolo hasta el fondo de la jeringa con el pulgar o el dedo ndice de la mano con la que escribe para Tourist information centre manager la insulina. 7. Suelte la zona de la piel que est pellizcando. Contine sosteniendo la [place] en su lugar con la mano con la que escribe. 8. Espere 10segundos, y luego extraiga la aguja de la piel. [employer], permitir que pase toda la insulina de la jeringa y la aguja a su cuerpo. 9. Sostenga el pao con alcohol presionando sobre el sitio de la inyeccin Hotel manager cualquier sangrado. No frote la zona. 10. No vuelva a colocar la cubierta plstica en la aguja. 11. Deseche la Kyung Rudd y la aguja directamente en un recipiente para objetos punzantes, como una botella de plstico vaca con tapa. Cmo desechar los materiales usados  Deseche todas las agujas usadas en un recipiente para objetos cortopunzantes (resistente a punciones). Pregunte en su farmacia local dnde puede conseguir este tipo de recipiente para desechos, o use una botella de  plstico vaca con tapa, como las botellas de detergente lquido para la ropa.  Siga las normas de seguridad de la zona en la que usted vive para [employer] materiales usados. No use la jeringa ni la aguja ms de [employer].  Deseche las ampollas vacas en el cesto de basura de su casa. Preguntas para hacerle al mdico  Con qu frecuencia debo inyectarme insulina?  Con qu frecuencia debo controlarme la glucemia?  Qu cantidad de insulina debo inyectarme cada vez?  Cules son los efectos secundarios?  Qu debo hacer si mi nivel de glucemia es muy alto?  Qu debo hacer si mi nivel de glucemia es muy bajo?  Qu debo hacer si olvido inyectarme la insulina?  A qu nmero debo llamar si tengo preguntas? Dnde encontrar ms informacin  Asociacin Estadounidense de la Diabetes (American Diabetes Association, ADA): www.diabetes.org  Asociacin Estadounidense de Instructores para el Cuidado de la Diabetes (American Association of Diabetes Educators, AADE): https://www.diabeteseducator.org Resumen  Una inyeccin subcutnea es una inyeccin de medicamentos que se aplica en la capa de grasa y tejido que se encuentra entre la piel y [place].  Antes de aplicarse la inyeccin de insulina, no se olvide de controlar su nivel de azcar en la sangre (glucemia) y anotar Administrator, sports.  El tipo de [place] que use determinar cuntas inyecciones deber aplicarse y cundo.  Verifique la fecha de vencimiento y el tipo de insulina que [place].  Lo mejor es inyectar la insulina siempre en la misma zona del cuerpo (por ejemplo, siempre en el abdomen), pero usando un lugar diferente de esa zona para cada inyeccin. Esta informacin no tiene Theme park manager el consejo del mdico. Asegrese de hacerle al mdico cualquier pregunta que tenga. Document Revised: 03/21/2017 Document Reviewed: 07/24/2015 Elsevier Patient Education  2020 [employer].

## 2019-12-06 NOTE — Progress Notes (Addendum)
Patient ID: Jacob Schneider, male   DOB: [birth year], 44 y.o.   MRN: [id number]       Subjective: Feels better this morning, but having pain as expected.  No nausea or vomiting.    ROS: See above, otherwise other systems negative  Objective: Vital signs in last 24 hours: Temp:  [99 F (37.2 C)-100.7 F (38.2 C)] 99.6 F (37.6 C) (06/04 0609) Pulse Rate:  [94-123] 98 (06/04 0609) Resp:  [15-26] 20 (06/04 0609) BP: (115-165)/(76-114) 130/96 (06/04 0609) SpO2:  [95 %-100 %] 100 % (06/04 0609) Weight:  [77.5 kg-89.8 kg] 77.5 kg (06/03 2249)    Intake/Output from previous day: 06/03 0701 - 06/04 0700 In: 2924 [P.O.:240; I.V.:684; IV Piggyback:2000] Out: 1500 [Urine:1500] Intake/Output this shift: No intake/output data recorded.  PE: Gen: NAD HEENT: PERRL Heart: regular Lungs: CTAB Abd: soft, tender as expected on left flank and around his left back.  Ecchymosis present on left flank, +BS, ND Ext: MAE Neuro:  Normal sensation Psych: A&Ox3   Lab Results:  Recent Labs    12/05/19 1247 12/05/19 1713 12/05/19 2139 12/06/19 0459  WBC 8.4  --   --  7.2  HGB 12.5*   < > 10.1* 9.9*  HCT 38.0*   < > 30.8* 30.4*  PLT 214  --   --  157   < > = values in this interval not displayed.   BMET Recent Labs    12/05/19 1247 12/06/19 0459  NA 129* 133*  K 4.8 4.0  CL 92* 99  CO2 23 24  GLUCOSE 489* 211*  BUN 22* 16  CREATININE 0.84 0.77  CALCIUM 9.6 8.1*   PT/INR Recent Labs    12/05/19 1247  LABPROT 11.9  INR 0.9   CMP     Component Value Date/Time   NA 133 (L) 12/06/2019 0459   K 4.0 12/06/2019 0459   CL 99 12/06/2019 0459   CO2 24 12/06/2019 0459   GLUCOSE 211 (H) 12/06/2019 0459   BUN 16 12/06/2019 0459   CREATININE 0.77 12/06/2019 0459   CALCIUM 8.1 (L) 12/06/2019 0459   PROT 7.1 12/05/2019 1247   ALBUMIN 3.8 12/05/2019 1247   AST 31 12/05/2019 1247   ALT 27 12/05/2019 1247   ALKPHOS 103 12/05/2019 1247   BILITOT 1.3 (H) 12/05/2019 1247   GFRNONAA  >60 12/06/2019 0459   GFRAA >60 12/06/2019 0459   Lipase     Component Value Date/Time   LIPASE 26 07/30/2017 2214       Studies/Results: DG Chest 1 View  Result Date: 12/05/2019 CLINICAL DATA:  Fall. EXAM: CHEST  1 VIEW COMPARISON:  Chest x-ray dated Dec 01, 2017. FINDINGS: The heart size and mediastinal contours are within normal limits. Both lungs are clear. The visualized skeletal structures are unremarkable. IMPRESSION: No active disease. Electronically Signed   By: Titus Dubin M.D.   On: 12/05/2019 14:03   DG Pelvis 1-2 Views  Result Date: 12/05/2019 CLINICAL DATA:  Fall. EXAM: PELVIS - 1-2 VIEW COMPARISON:  CT a chest, abdomen, and pelvis dated July 31, 2017. FINDINGS: There is no evidence of pelvic fracture or diastasis. No pelvic bone lesions are seen. Chronic soft tissue calcification lateral to the right acetabulum. IMPRESSION: No acute osseous abnormality. Electronically Signed   By: Titus Dubin M.D.   On: 12/05/2019 14:00   DG Elbow Complete Left  Result Date: 12/05/2019 CLINICAL DATA:  Fall. EXAM: LEFT ELBOW - COMPLETE 3+ VIEW COMPARISON:  None. FINDINGS: There is  no evidence of fracture, dislocation, or joint effusion. There is no evidence of arthropathy or other focal bone abnormality. Triceps enthesophyte. Soft tissues are unremarkable. IMPRESSION: Negative. Electronically Signed   By: Obie Dredge M.D.   On: 12/05/2019 14:03   CT Chest W Contrast  Result Date: 12/05/2019 CLINICAL DATA:  Abdominal trauma, fall from ladder Tuesday EXAM: CT CHEST, ABDOMEN, AND PELVIS WITH CONTRAST TECHNIQUE: Multidetector CT imaging of the chest, abdomen and pelvis was performed following the standard protocol during bolus administration of intravenous contrast. CONTRAST:  OMNIPAQUE IOHEXOL 300 MG/ML  SOLN COMPARISON:  CT angiogram chest abdomen pelvis, 07/30/2017 FINDINGS: CT CHEST FINDINGS Cardiovascular: No significant vascular findings. Normal heart size. No pericardial  effusion. Mediastinum/Nodes: No enlarged mediastinal, hilar, or axillary lymph nodes. Thyroid gland, trachea, and esophagus demonstrate no significant findings. Lungs/Pleura: Trace left pleural effusion associated atelectasis or consolidation. Musculoskeletal: No chest wall mass. Minimally displaced fractures of the posterior left tenth and eleventh ribs (series 3, image 53). CT ABDOMEN PELVIS FINDINGS Hepatobiliary: Hepatomegaly, maximum coronal span 22 cm. Hepatic steatosis. No gallstones, gallbladder wall thickening, or biliary dilatation. Pancreas: Unremarkable. No pancreatic ductal dilatation or surrounding inflammatory changes. Spleen: Normal in size without significant abnormality. Adrenals/Urinary Tract: Adrenal glands are unremarkable. There is a subcapsular hematoma about the left kidney, measuring approximately 3.1 cm in thickness (series 3, image 64). The right kidney is normal. No hydronephrosis. Bladder is unremarkable. Stomach/Bowel: Stomach is within normal limits. The appendix is fluid-filled, measuring up to 1.2 cm in caliber (series 3, image 93). There is a fluid-filled lesion at the right cecal base adjacent to the appendiceal os measuring 2.8 x 2.3 cm (series 3, image 91). No evidence of bowel wall thickening, distention, or inflammatory changes. Vascular/Lymphatic: No significant vascular findings are present. No enlarged abdominal or pelvic lymph nodes. Reproductive: No mass or other abnormality. Other: No abdominal wall hernia or abnormality. No abdominopelvic ascites. Small left retroperitoneal hematoma measuring up to 1.3 cm in thickness (series 3, image 93). Musculoskeletal: No acute or significant osseous findings. IMPRESSION: 1. Minimally displaced fractures of the posterior left tenth and eleventh ribs. Trace left pleural effusion with associated atelectasis or consolidation. 2. There is a subcapsular hematoma about the left kidney, measuring approximately 3.1 cm in thickness. 3. Small  adjacent left retroperitoneal hematoma measuring up to 1.3 cm in thickness. 4. The appendix is fluid-filled, measuring up to 1.2 cm in caliber. There is a fluid-filled lesion at the right cecal base adjacent to the appendiceal os measuring 2.8 x 2.3 cm. 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 Recommend nonemergent surgical consultation due to malignant potential. 5. Hepatomegaly and hepatic steatosis. These results were called by telephone at the time of interpretation on 12/05/2019 at 4:46 pm to PA [facility] , who verbally acknowledged these results. Electronically Signed   By: Lauralyn Primes M.D.   On: 12/05/2019 16:50   CT ABDOMEN PELVIS W CONTRAST  Result Date: 12/05/2019 CLINICAL DATA:  Abdominal trauma, fall from ladder Tuesday EXAM: CT CHEST, ABDOMEN, AND PELVIS WITH CONTRAST TECHNIQUE: Multidetector CT imaging of the chest, abdomen and pelvis was performed following the standard protocol during bolus administration of intravenous contrast. CONTRAST:  OMNIPAQUE IOHEXOL 300 MG/ML  SOLN COMPARISON:  CT angiogram chest abdomen pelvis, 07/30/2017 FINDINGS: CT CHEST FINDINGS Cardiovascular: No significant vascular findings. Normal heart size. No pericardial effusion. Mediastinum/Nodes: No enlarged mediastinal, hilar, or axillary lymph nodes. Thyroid gland, trachea, and esophagus demonstrate no significant  findings. Lungs/Pleura: Trace left pleural effusion associated atelectasis or consolidation. Musculoskeletal: No chest wall mass. Minimally displaced fractures of the posterior left tenth and eleventh ribs (series 3, image 53). CT ABDOMEN PELVIS FINDINGS Hepatobiliary: Hepatomegaly, maximum coronal span 22 cm. Hepatic steatosis. No gallstones, gallbladder wall thickening, or biliary dilatation. Pancreas: Unremarkable. No pancreatic ductal dilatation or surrounding inflammatory changes. Spleen: Normal in size without significant  abnormality. Adrenals/Urinary Tract: Adrenal glands are unremarkable. There is a subcapsular hematoma about the left kidney, measuring approximately 3.1 cm in thickness (series 3, image 64). The right kidney is normal. No hydronephrosis. Bladder is unremarkable. Stomach/Bowel: Stomach is within normal limits. The appendix is fluid-filled, measuring up to 1.2 cm in caliber (series 3, image 93). There is a fluid-filled lesion at the right cecal base adjacent to the appendiceal os measuring 2.8 x 2.3 cm (series 3, image 91). No evidence of bowel wall thickening, distention, or inflammatory changes. Vascular/Lymphatic: No significant vascular findings are present. No enlarged abdominal or pelvic lymph nodes. Reproductive: No mass or other abnormality. Other: No abdominal wall hernia or abnormality. No abdominopelvic ascites. Small left retroperitoneal hematoma measuring up to 1.3 cm in thickness (series 3, image 93). Musculoskeletal: No acute or significant osseous findings. IMPRESSION: 1. Minimally displaced fractures of the posterior left tenth and eleventh ribs. Trace left pleural effusion with associated atelectasis or consolidation. 2. There is a subcapsular hematoma about the left kidney, measuring approximately 3.1 cm in thickness. 3. Small adjacent left retroperitoneal hematoma measuring up to 1.3 cm in thickness. 4. The appendix is fluid-filled, measuring up to 1.2 cm in caliber. There is a fluid-filled lesion at the right cecal base adjacent to the appendiceal os measuring 2.8 x 2.3 cm. 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 Recommend nonemergent surgical consultation due to malignant potential. 5. Hepatomegaly and hepatic steatosis. These results were called by telephone at the time of interpretation on 12/05/2019 at 4:46 pm to PA [facility] , who verbally acknowledged these results. Electronically Signed   By: Lauralyn Primes M.D.   On:  12/05/2019 16:50   DG Knee Complete 4 Views Left  Result Date: 12/05/2019 CLINICAL DATA:  Fall. EXAM: LEFT KNEE - COMPLETE 4+ VIEW COMPARISON:  None. FINDINGS: No evidence of fracture, dislocation, or joint effusion. No evidence of arthropathy or other focal bone abnormality. Soft tissues are unremarkable. IMPRESSION: Negative. Electronically Signed   By: Obie Dredge M.D.   On: 12/05/2019 14:01   DG Knee Complete 4 Views Right  Result Date: 12/05/2019 CLINICAL DATA:  Fall. EXAM: RIGHT KNEE - COMPLETE 4+ VIEW COMPARISON:  None. FINDINGS: No evidence of fracture, dislocation, or joint effusion. No evidence of arthropathy or other focal bone abnormality. Soft tissues are unremarkable. IMPRESSION: Negative. Electronically Signed   By: Obie Dredge M.D.   On: 12/05/2019 14:00    Anti-infectives: Anti-infectives (From admission, onward)   None       Assessment/Plan Fall 48 hrs prior to admit Left 10-11 rib fxs - pain control, IS, mobilization RTP/ L perinephric hematoma -  hgb starting to stabilize.  PT/OT.  Cbc in am DM - hgb A1C 12.  SSI, restart home meds.  Diabetes consult.  Consult to [facility] for PCP referral  H/O HTN - not taking home meds.  BP ok for now.  PCP referral FEN - CLD, adv to carb mod as tolerates VTE - hold for now til hgb stabilizes definitively  ID - none currently warranted  LOS: 0 days    Letha Cape , [facility] 12/06/2019, 9:12 AM Please see Amion for pager number during day hours 7:00am-4:30pm or 7:00am -11:30am on weekends

## 2019-12-07 LAB — CBC
HCT: 29.8 % — ABNORMAL LOW (ref 39.0–52.0)
Hemoglobin: 9.6 g/dL — ABNORMAL LOW (ref 13.0–17.0)
MCH: 27.4 pg (ref 26.0–34.0)
MCHC: 32.2 g/dL (ref 30.0–36.0)
MCV: 84.9 fL (ref 80.0–100.0)
Platelets: 194 10*3/uL (ref 150–400)
RBC: 3.51 MIL/uL — ABNORMAL LOW (ref 4.22–5.81)
RDW: 13.1 % (ref 11.5–15.5)
WBC: 8.7 10*3/uL (ref 4.0–10.5)
nRBC: 0 % (ref 0.0–0.2)

## 2019-12-07 LAB — GLUCOSE, CAPILLARY
Glucose-Capillary: 132 mg/dL — ABNORMAL HIGH (ref 70–99)
Glucose-Capillary: 220 mg/dL — ABNORMAL HIGH (ref 70–99)
Glucose-Capillary: 98 mg/dL (ref 70–99)

## 2019-12-07 MED ORDER — INSULIN ASPART PROT & ASPART (70-30 MIX) 100 UNIT/ML ~~LOC~~ SUSP
8.0000 [IU] | Freq: Two times a day (BID) | SUBCUTANEOUS | Status: DC
  Filled 2019-12-07: qty 10

## 2019-12-07 MED ORDER — GLIMEPIRIDE 4 MG PO TABS: 4.0000 mg | ORAL_TABLET | Freq: Every day | ORAL | 0 refills | Status: DC

## 2019-12-07 MED ORDER — OXYCODONE HCL 5 MG PO TABS: 5.0000 mg | ORAL_TABLET | ORAL | Status: DC | PRN

## 2019-12-07 MED ORDER — ACETAMINOPHEN 500 MG PO TABS: 1000.0000 mg | ORAL_TABLET | Freq: Four times a day (QID) | ORAL | 0 refills | Status: DC | PRN

## 2019-12-07 MED ORDER — MORPHINE SULFATE (PF) 2 MG/ML IV SOLN: 2.0000 mg | INTRAVENOUS | Status: DC | PRN

## 2019-12-07 MED ORDER — NOVOLIN 70/30 (70-30) 100 UNIT/ML ~~LOC~~ SUSP
10.0000 [IU] | Freq: Two times a day (BID) | SUBCUTANEOUS | 0 refills | Status: DC
Start: 2019-12-07 — End: 2020-01-01

## 2019-12-07 MED ORDER — OXYCODONE HCL 5 MG PO TABS: 5.0000 mg | ORAL_TABLET | Freq: Four times a day (QID) | ORAL | 0 refills | Status: DC | PRN

## 2019-12-07 MED ORDER — METFORMIN HCL 500 MG PO TABS: 500.0000 mg | ORAL_TABLET | Freq: Two times a day (BID) | ORAL | 0 refills | Status: DC

## 2019-12-07 NOTE — Care Management (Signed)
Provided with MATCH letter with override for narcs

## 2019-12-07 NOTE — Progress Notes (Signed)
DISCHARGE NOTE HOME SIRE POET to be discharged Home per MD order. Discussed prescriptions and follow up appointments with the patient. Prescriptions given to patient; medication list explained in detail. Patient verbalized understanding.  Skin clean, dry and intact without evidence of skin break down, no evidence of skin tears noted. IV catheter discontinued intact. Site without signs and symptoms of complications. Dressing and pressure applied. Pt denies pain at the site currently. No complaints noted.  Patient free of lines, drains, and wounds.   An After Visit Summary (AVS) was printed and given to the patient. Patient escorted via wheelchair, and discharged home via private auto.  Margretta Sidle, RN3

## 2019-12-07 NOTE — Discharge Summary (Signed)
[facility] Discharge Summary   Patient ID: Jacob Schneider MRN: [id number] DOB/AGE: 44/44/1977 44 y.o.  Admit date: 12/05/2019 Discharge date: 12/07/2019  Admitting Diagnosis: Subcapsular hematoma of kidney 2 rib fractures Diabetes with hyperglycemia  Discharge Diagnosis Fall 48 hrs prior to admit Left 10-11 rib fxs RTP/ L perinephric hematoma DM   Consultants None  Imaging: DG Chest 1 View  Result Date: 12/05/2019 CLINICAL DATA:  Fall. EXAM: CHEST  1 VIEW COMPARISON:  Chest x-ray dated Dec 01, 2017. FINDINGS: The heart size and mediastinal contours are within normal limits. Both lungs are clear. The visualized skeletal structures are unremarkable. IMPRESSION: No active disease. Electronically Signed   By: Titus Dubin M.D.   On: 12/05/2019 14:03   DG Pelvis 1-2 Views  Result Date: 12/05/2019 CLINICAL DATA:  Fall. EXAM: PELVIS - 1-2 VIEW COMPARISON:  CT a chest, abdomen, and pelvis dated July 31, 2017. FINDINGS: There is no evidence of pelvic fracture or diastasis. No pelvic bone lesions are seen. Chronic soft tissue calcification lateral to the right acetabulum. IMPRESSION: No acute osseous abnormality. Electronically Signed   By: Titus Dubin M.D.   On: 12/05/2019 14:00   DG Elbow Complete Left  Result Date: 12/05/2019 CLINICAL DATA:  Fall. EXAM: LEFT ELBOW - COMPLETE 3+ VIEW COMPARISON:  None. FINDINGS: There is no evidence of fracture, dislocation, or joint effusion. There is no evidence of arthropathy or other focal bone abnormality. Triceps enthesophyte. Soft tissues are unremarkable. IMPRESSION: Negative. Electronically Signed   By: Titus Dubin M.D.   On: 12/05/2019 14:03   CT Chest W Contrast  Result Date: 12/05/2019 CLINICAL DATA:  Abdominal trauma, fall from ladder Tuesday EXAM: CT CHEST, ABDOMEN, AND PELVIS WITH CONTRAST TECHNIQUE: Multidetector CT imaging of the chest, abdomen and pelvis was performed following the standard protocol during bolus  administration of intravenous contrast. CONTRAST:  187m OMNIPAQUE IOHEXOL 300 MG/ML  SOLN COMPARISON:  CT angiogram chest abdomen pelvis, 07/30/2017 FINDINGS: CT CHEST FINDINGS Cardiovascular: No significant vascular findings. Normal heart size. No pericardial effusion. Mediastinum/Nodes: No enlarged mediastinal, hilar, or axillary lymph nodes. Thyroid gland, trachea, and esophagus demonstrate no significant findings. Lungs/Pleura: Trace left pleural effusion associated atelectasis or consolidation. Musculoskeletal: No chest wall mass. Minimally displaced fractures of the posterior left tenth and eleventh ribs (series 3, image 53). CT ABDOMEN PELVIS FINDINGS Hepatobiliary: Hepatomegaly, maximum coronal span 22 cm. Hepatic steatosis. No gallstones, gallbladder wall thickening, or biliary dilatation. Pancreas: Unremarkable. No pancreatic ductal dilatation or surrounding inflammatory changes. Spleen: Normal in size without significant abnormality. Adrenals/Urinary Tract: Adrenal glands are unremarkable. There is a subcapsular hematoma about the left kidney, measuring approximately 3.1 cm in thickness (series 3, image 64). The right kidney is normal. No hydronephrosis. Bladder is unremarkable. Stomach/Bowel: Stomach is within normal limits. The appendix is fluid-filled, measuring up to 1.2 cm in caliber (series 3, image 93). There is a fluid-filled lesion at the right cecal base adjacent to the appendiceal os measuring 2.8 x 2.3 cm (series 3, image 91). No evidence of bowel wall thickening, distention, or inflammatory changes. Vascular/Lymphatic: No significant vascular findings are present. No enlarged abdominal or pelvic lymph nodes. Reproductive: No mass or other abnormality. Other: No abdominal wall hernia or abnormality. No abdominopelvic ascites. Small left retroperitoneal hematoma measuring up to 1.3 cm in thickness (series 3, image 93). Musculoskeletal: No acute or significant osseous findings. IMPRESSION: 1.  Minimally displaced fractures of the posterior left tenth and eleventh ribs. Trace left pleural effusion with associated atelectasis or  consolidation. 2. There is a subcapsular hematoma about the left kidney, measuring approximately 3.1 cm in thickness. 3. Small adjacent left retroperitoneal hematoma measuring up to 1.3 cm in thickness. 4. The appendix is fluid-filled, measuring up to 1.2 cm in caliber. There is a fluid-filled lesion at the right cecal base adjacent to the appendiceal os measuring 2.8 x 2.3 cm. 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 Recommend nonemergent surgical consultation due to malignant potential. 5. Hepatomegaly and hepatic steatosis. These results were called by telephone at the time of interpretation on 12/05/2019 at 4:46 pm to [place] , who verbally acknowledged these results. Electronically Signed   By: Eddie Candle M.D.   On: 12/05/2019 16:50   CT ABDOMEN PELVIS W CONTRAST  Result Date: 12/05/2019 CLINICAL DATA:  Abdominal trauma, fall from ladder Tuesday EXAM: CT CHEST, ABDOMEN, AND PELVIS WITH CONTRAST TECHNIQUE: Multidetector CT imaging of the chest, abdomen and pelvis was performed following the standard protocol during bolus administration of intravenous contrast. CONTRAST:  192m OMNIPAQUE IOHEXOL 300 MG/ML  SOLN COMPARISON:  CT angiogram chest abdomen pelvis, 07/30/2017 FINDINGS: CT CHEST FINDINGS Cardiovascular: No significant vascular findings. Normal heart size. No pericardial effusion. Mediastinum/Nodes: No enlarged mediastinal, hilar, or axillary lymph nodes. Thyroid gland, trachea, and esophagus demonstrate no significant findings. Lungs/Pleura: Trace left pleural effusion associated atelectasis or consolidation. Musculoskeletal: No chest wall mass. Minimally displaced fractures of the posterior left tenth and eleventh ribs (series 3, image 53). CT ABDOMEN PELVIS FINDINGS Hepatobiliary:  Hepatomegaly, maximum coronal span 22 cm. Hepatic steatosis. No gallstones, gallbladder wall thickening, or biliary dilatation. Pancreas: Unremarkable. No pancreatic ductal dilatation or surrounding inflammatory changes. Spleen: Normal in size without significant abnormality. Adrenals/Urinary Tract: Adrenal glands are unremarkable. There is a subcapsular hematoma about the left kidney, measuring approximately 3.1 cm in thickness (series 3, image 64). The right kidney is normal. No hydronephrosis. Bladder is unremarkable. Stomach/Bowel: Stomach is within normal limits. The appendix is fluid-filled, measuring up to 1.2 cm in caliber (series 3, image 93). There is a fluid-filled lesion at the right cecal base adjacent to the appendiceal os measuring 2.8 x 2.3 cm (series 3, image 91). No evidence of bowel wall thickening, distention, or inflammatory changes. Vascular/Lymphatic: No significant vascular findings are present. No enlarged abdominal or pelvic lymph nodes. Reproductive: No mass or other abnormality. Other: No abdominal wall hernia or abnormality. No abdominopelvic ascites. Small left retroperitoneal hematoma measuring up to 1.3 cm in thickness (series 3, image 93). Musculoskeletal: No acute or significant osseous findings. IMPRESSION: 1. Minimally displaced fractures of the posterior left tenth and eleventh ribs. Trace left pleural effusion with associated atelectasis or consolidation. 2. There is a subcapsular hematoma about the left kidney, measuring approximately 3.1 cm in thickness. 3. Small adjacent left retroperitoneal hematoma measuring up to 1.3 cm in thickness. 4. The appendix is fluid-filled, measuring up to 1.2 cm in caliber. There is a fluid-filled lesion at the right cecal base adjacent to the appendiceal os measuring 2.8 x 2.3 cm. 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 Recommend nonemergent surgical consultation due to  malignant potential. 5. Hepatomegaly and hepatic steatosis. These results were called by telephone at the time of interpretation on 12/05/2019 at 4:46 pm to [place], who verbally acknowledged these results. Electronically Signed   By: AEddie CandleM.D.   On: 12/05/2019 16:50   DG Knee Complete 4 Views Left  Result  Date: 12/05/2019 CLINICAL DATA:  Fall. EXAM: LEFT KNEE - COMPLETE 4+ VIEW COMPARISON:  None. FINDINGS: No evidence of fracture, dislocation, or joint effusion. No evidence of arthropathy or other focal bone abnormality. Soft tissues are unremarkable. IMPRESSION: Negative. Electronically Signed   By: Titus Dubin M.D.   On: 12/05/2019 14:01   DG Knee Complete 4 Views Right  Result Date: 12/05/2019 CLINICAL DATA:  Fall. EXAM: RIGHT KNEE - COMPLETE 4+ VIEW COMPARISON:  None. FINDINGS: No evidence of fracture, dislocation, or joint effusion. No evidence of arthropathy or other focal bone abnormality. Soft tissues are unremarkable. IMPRESSION: Negative. Electronically Signed   By: Titus Dubin M.D.   On: 12/05/2019 14:00    Procedures None  Hospital Course:  Jacob Schneider is a 44yo male h/o HTN and DM off his medications for several months, who presented to [facility] 6/3 after he was working at a house when he missed his footing and fell down the stairs. He fell on his left side. He did not lose consciousness. He has had worsening pain on the left side over the last 2 days so came into the hospital. He denies dizziness. Pain is worse with deep breathing.  Workup showed Subcapsular hematoma of kidney and 2 rib fractures.  Patient was admitted to the trauma service for pain control and observation. H/H monitored and remained stable.  Patient worked with therapies during this admission. On 6/5 the patient was tolerating diet, ambulating well, pain well controlled, vital signs stable and felt stable for discharge home.  Patient will follow up as below and knows to call with questions or  concerns.  We will send 1 month supply of his diabetes medications, and he knows to follow up with PCP to discuss this and his hypertension.   I have personally reviewed the patients medication history on the  controlled substance database.   Physical Exam: General:  Alert, NAD, pleasant Pulm: CTAB, rate and effort normal, pulling 1250 on IS Cardio: RRR Abd:  Soft, ND, +BS, no HSM, ecchymosis and tenderness of the left flank area Ext: moving all extremities Psych: A&Ox3  Allergies as of 12/07/2019   No Known Allergies     Medication List    STOP taking these medications   aspirin EC 81 MG tablet   atorvastatin 20 MG tablet Commonly known as: LIPITOR   gabapentin 100 MG capsule Commonly known as: NEURONTIN   hydrochlorothiazide 25 MG tablet Commonly known as: HYDRODIURIL   lisinopril 2.5 MG tablet Commonly known as: ZESTRIL     TAKE these medications   acetaminophen 500 MG tablet Commonly known as: TYLENOL Take 2 tablets (1,000 mg total) by mouth every 6 (six) hours as needed for mild pain.   blood glucose meter kit and supplies Kit Dispense based on patient and insurance preference. Daily in AM   glimepiride 4 MG tablet Commonly known as: AMARYL Take 1 tablet (4 mg total) by mouth daily before breakfast.   Insulin Syringes (Disposable) U-100 0.5 ML Misc Take insulin as directed   metFORMIN 500 MG tablet Commonly known as: GLUCOPHAGE Take 1 tablet (500 mg total) by mouth 2 (two) times daily with a meal.   NovoLIN 70/30 (70-30) 100 UNIT/ML injection Generic drug: insulin NPH-regular Human Inject 10 Units into the skin 2 (two) times daily with a meal.   oxyCODONE 5 MG immediate release tablet Commonly known as: Oxy IR/ROXICODONE Take 1 tablet (5 mg total) by mouth every 6 (six) hours as needed for severe pain.  Follow-up Information    [facility] Follow up on 12/19/2019.   Why: 10 am, arrive by 9:30am for paperwork and check in  process Contact information: [place] [address] [phone]       [facility]. Schedule an appointment as soon as possible for a visit.   Contact information: [place] [address] [phone]          Signed: Wellington Hampshire, [facility] 12/07/2019, 10:09 AM Please see Amion for pager number during day hours 7:00am-4:30pm

## 2019-12-07 NOTE — Progress Notes (Signed)
Physical Therapy Treatment Patient Details Name: Jacob Schneider MRN: [id number] DOB: 1976-03-06 Today's Date: 12/07/2019    History of Present Illness Pt is a 44 y.o. male s/p fall down stairs, fell on left side on 06/01, came to hospital on 06/03 due to worsening pain. CT showed hemotoma left kidney and ribs 10-11.  PMH chronic LBP, DM II, DKA, stomach ulcers, HTN, lumbar disc surgery 2004.    PT Comments    Pt making steady progress overall with functional mobility as indicated by his ability to participate in stair training this session without difficulties. PT provided pt education re: use and benefits of IS as well as splinting with a pillow for cough/sneeze/laugh/etc. Pt expressed understanding. PT will continue to f/u with pt acutely to progress mobility as tolerated.     Follow Up Recommendations  No PT follow up     Equipment Recommendations  None recommended by PT    Recommendations for Other Services       Precautions / Restrictions Precautions Precautions: Fall Restrictions Weight Bearing Restrictions: No    Mobility  Bed Mobility               General bed mobility comments: pt OOB in recliner chair upon arrival  Transfers Overall transfer level: Needs assistance Equipment used: None Transfers: Sit to/from Stand Sit to Stand: Supervision         General transfer comment: no instability with transition  Ambulation/Gait Ambulation/Gait assistance: Supervision Gait Distance (Feet): 100 Feet Assistive device: None Gait Pattern/deviations: Step-through pattern;Decreased stride length;Decreased weight shift to right Gait velocity: decreased   General Gait Details: pt overall steady without use of an AD; mildly antalgic but greatly improved since previous session   Stairs Stairs: Yes Stairs assistance: Supervision Stair Management: One rail Right;Alternating pattern;Forwards Number of Stairs: 3 General stair comments: no instability or  LOB   Wheelchair Mobility    Modified Rankin (Stroke Patients Only)       Balance Overall balance assessment: Needs assistance Sitting-balance support: Feet supported Sitting balance-Leahy Scale: Good     Standing balance support: No upper extremity supported Standing balance-Leahy Scale: Good                              Cognition Arousal/Alertness: Awake/alert Behavior During Therapy: WFL for tasks assessed/performed Overall Cognitive Status: Within Functional Limits for tasks assessed                                        Exercises      General Comments        Pertinent Vitals/Pain Pain Assessment: Faces Faces Pain Scale: Hurts little more Pain Location: L lateral trunk Pain Descriptors / Indicators: Grimacing;Discomfort;Guarding Pain Intervention(s): Monitored during session;Repositioned    Home Living                      Prior Function            PT Goals (current goals can now be found in the care plan section) Acute Rehab PT Goals PT Goal Formulation: With patient Time For Goal Achievement: 12/06/19 Potential to Achieve Goals: Good Progress towards PT goals: Progressing toward goals    Frequency    Min 3X/week      PT Plan Current plan remains appropriate    Co-evaluation  AM-PAC PT "6 Clicks" Mobility   Outcome Measure  Help needed turning from your back to your side while in a flat bed without using bedrails?: None Help needed moving from lying on your back to sitting on the side of a flat bed without using bedrails?: None Help needed moving to and from a bed to a chair (including a wheelchair)?: None Help needed standing up from a chair using your arms (e.g., wheelchair or bedside chair)?: None Help needed to walk in hospital room?: None Help needed climbing 3-5 steps with a railing? : None 6 Click Score: 24    End of Session   Activity Tolerance: Patient tolerated  treatment well Patient left: in chair;with call bell/phone within reach;with chair alarm set Nurse Communication: Mobility status PT Visit Diagnosis: Unsteadiness on feet (R26.81);Pain Pain - Right/Left: Left Pain - part of body: (torso)     Time: 2820-6015 PT Time Calculation (min) (ACUTE ONLY): 11 min  Charges:  $Gait Training: 8-22 mins                     Arletta Bale, DPT  Acute Rehabilitation Services Pager [phone] Office [phone]     Alessandra Bevels Min Tunnell 12/07/2019, 12:29 PM

## 2020-01-01 ENCOUNTER — Other Ambulatory Visit: Payer: Self-pay

## 2020-01-01 ENCOUNTER — Ambulatory Visit: Payer: Self-pay | Attending: Critical Care Medicine | Admitting: Critical Care Medicine

## 2020-01-01 ENCOUNTER — Encounter: Payer: Self-pay | Admitting: Critical Care Medicine

## 2020-01-01 VITALS — BP 138/89 | HR 92 | Temp 98.4°F | Resp 17 | Ht 65.0 in | Wt 187.2 lb

## 2020-01-01 DIAGNOSIS — E1110 Type 2 diabetes mellitus with ketoacidosis without coma: Secondary | ICD-10-CM

## 2020-01-01 DIAGNOSIS — Z7689 Persons encountering health services in other specified circumstances: Secondary | ICD-10-CM

## 2020-01-01 DIAGNOSIS — E7800 Pure hypercholesterolemia, unspecified: Secondary | ICD-10-CM

## 2020-01-01 DIAGNOSIS — G629 Polyneuropathy, unspecified: Secondary | ICD-10-CM

## 2020-01-01 DIAGNOSIS — I10 Essential (primary) hypertension: Secondary | ICD-10-CM

## 2020-01-01 DIAGNOSIS — S2242XA Multiple fractures of ribs, left side, initial encounter for closed fracture: Secondary | ICD-10-CM

## 2020-01-01 DIAGNOSIS — Z09 Encounter for follow-up examination after completed treatment for conditions other than malignant neoplasm: Secondary | ICD-10-CM

## 2020-01-01 DIAGNOSIS — Z1159 Encounter for screening for other viral diseases: Secondary | ICD-10-CM

## 2020-01-01 DIAGNOSIS — S37012A Minor contusion of left kidney, initial encounter: Secondary | ICD-10-CM

## 2020-01-01 DIAGNOSIS — S37012D Minor contusion of left kidney, subsequent encounter: Secondary | ICD-10-CM

## 2020-01-01 MED ORDER — METFORMIN HCL 1000 MG PO TABS: 1000.0000 mg | ORAL_TABLET | Freq: Two times a day (BID) | ORAL | 4 refills | Status: DC

## 2020-01-01 MED ORDER — GABAPENTIN 100 MG PO CAPS: 100.0000 mg | ORAL_CAPSULE | Freq: Three times a day (TID) | ORAL | 3 refills | Status: DC

## 2020-01-01 MED ORDER — INSULIN GLARGINE 100 UNITS/ML SOLOSTAR PEN: 20.0000 [IU] | PEN_INJECTOR | Freq: Every day | SUBCUTANEOUS | 4 refills | Status: DC

## 2020-01-01 MED ORDER — ATORVASTATIN CALCIUM 20 MG PO TABS
20.0000 mg | ORAL_TABLET | Freq: Every day | ORAL | 3 refills | Status: DC
Start: 2020-01-01 — End: 2020-02-05

## 2020-01-01 MED ORDER — LISINOPRIL 10 MG PO TABS
10.0000 mg | ORAL_TABLET | Freq: Every day | ORAL | 3 refills | Status: DC
Start: 2020-01-01 — End: 2020-02-05

## 2020-01-01 MED FILL — LISINOPRIL 10 MG TABS: 10 | 30 days supply | Qty: 30 | Fill #0

## 2020-01-01 MED FILL — !LANTUS SOLOSTAR 100UNITS/M: 100 | 30 days supply | Qty: 6 | Fill #0

## 2020-01-01 MED FILL — ATORVASTATIN CALCIUM 20 MG: 20 | 30 days supply | Qty: 30 | Fill #0

## 2020-01-01 MED FILL — GABAPENTIN 100 MG CAPSULE: 100 | 30 days supply | Qty: 90 | Fill #0

## 2020-01-01 NOTE — Assessment & Plan Note (Signed)
Hypertension currently not on medications and will need to be resumed  We will begin lisinopril 10 mg daily

## 2020-01-01 NOTE — Progress Notes (Signed)
Subjective:    Patient ID: Jacob Schneider, male    DOB: 1976-06-06, 44 y.o.   MRN: [id number]  44 y.o.M Latino the patient is accompanied by his friend who serves also as an interpreter  Patient comes today as a post hospital follow-up and to establish care.  Patient has a history of hypertension diagnosed for 9 years but was unaware he had diabetes.  Patient also has history of alcoholic gastritis.  He has been out of his medications for hypertension for some time during the pandemic.  He previously had been followed for primary care but lost contact and during the pandemic.  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  He was found to have diabetes out-of-control and was discharged on NPH twice daily Metformin twice daily and Amaryl daily.  Patient returns today in follow-up.  He is off his blood pressure medicines.  Blood pressure is ranging slightly elevated at this visit.  He states his blood sugars at home of been in the 1 72-80 range.  He states he has been compliant with the NPH.  Note during this hospitalization he was found to have fat in the liver and an enlarged appendix that would need further follow-up.  Patient has a prior history of excessive ethanol use and had history of alcohol delirium previously that has resolved this was associated with DKA as well as electrolyte disturbances.  That admission was in 2018.  Below is the discharge summary of the most recent admission   Admit date: 12/05/2019 Discharge date: 12/07/2019  Admitting Diagnosis: Subcapsular hematoma of kidney 2 rib fractures Diabetes with hyperglycemia  Discharge Diagnosis Fall 48 hrs prior to admit Left 10-11 rib fxs RTP/ L perinephric hematoma DM  Consultants None  Procedures None  Hospital Course:  Jacob Schneider is a 44yo male  h/o HTN and DM off his medications for several months, who presented to [facility]/3 after he was working at a house when he missed his footing and fell down the stairs. He fell on his left side. He did not lose consciousness. He has had worsening pain on the left side over the last 2 days so came into the hospital. He denies dizziness. Pain is worse with deep breathing.  Workup showed Subcapsular hematoma of kidney and 2 rib fractures.  Patient was admitted to the trauma service for pain control and observation. H/H monitored and remained stable.  Patient worked with therapies during this admission. On 6/5 the patient was tolerating diet, ambulating well, pain well controlled, vital signs stable and felt stable for discharge home.  Patient will follow up as below and knows to call with questions or concerns.  We will send 1 month supply of his diabetes medications, and he knows to follow up with PCP to discuss this and his hypertension.    Note this patient comes in with pictures of his old prescriptions showing he had been on atorvastatin 20 mg lisinopril 2.5 mg HCTZ 25 mg and gabapentin 100 mg in the past.  The patient states he does eat quite a bit of carbohydrates for breakfast lunch and dinner.  He states he is no longer drinking alcohol     Past Medical History:  Diagnosis Date  . Alcohol withdrawal delirium, acute, hyperactive ([place]) 03/30/2015  . Anxiety   .  Chronic lower back pain   . Diabetes mellitus type II, uncontrolled ([place])    "I think it's type 1" (07/31/2017)  . DKA (diabetic ketoacidoses) ([place]) 07/30/2017  . History of stomach ulcers   . Hypertension   . Renal hematoma 12/05/2019     Family History  Problem Relation Age of Onset  . Diabetes Father      Social History   Socioeconomic History  . Marital status: Single    Spouse name: Not on file  . Number of children: Not on file  . Years of education: Not on file  . Highest education level: Not on file  Occupational  History  . Not on file  Tobacco Use  . Smoking status: Former Smoker    Packs/day: 0.10    Years: 28.00    Pack years: 2.80    Types: Cigarettes  . Smokeless tobacco: Never Used  . Tobacco comment: 07/31/2017 "stopped before Thanksgiving"  Vaping Use  . Vaping Use: Never used  Substance and Sexual Activity  . Alcohol use: Yes    Alcohol/week: 28.0 standard drinks    Types: 28 Cans of beer per week    Comment: 07/31/2017 "4 beers/day on average"  . Drug use: Not Currently    Types: Cocaine    Comment: 07/31/2017 "none since ~ 05/2017"  . Sexual activity: Yes    Birth control/protection: None  Other Topics Concern  . Not on file  Social History Narrative  . Not on file   Social Determinants of Health   Financial Resource Strain:   . Difficulty of Paying Living Expenses:   Food Insecurity:   . Worried About Charity fundraiser in the Last Year:   . Arboriculturist in the Last Year:   Transportation Needs:   . Film/video editor (Medical):   Marland Kitchen Lack of Transportation (Non-Medical):   Physical Activity:   . Days of Exercise per Week:   . Minutes of Exercise per Session:   Stress:   . Feeling of Stress :   Social Connections:   . Frequency of Communication with Friends and Family:   . Frequency of Social Gatherings with Friends and Family:   . Attends Religious Services:   . Active Member of Clubs or Organizations:   . Attends Archivist Meetings:   Marland Kitchen Marital Status:   Intimate Partner Violence:   . Fear of Current or Ex-Partner:   . Emotionally Abused:   Marland Kitchen Physically Abused:   . Sexually Abused:      No Known Allergies   Outpatient Medications Prior to Visit  Medication Sig Dispense Refill  . acetaminophen (TYLENOL) 500 MG tablet Take 2 tablets (1,000 mg total) by mouth every 6 (six) hours as needed for mild pain. 30 tablet 0  . blood glucose meter kit and supplies KIT Dispense based on patient and insurance preference. Daily in AM 1 each 0  .  Insulin Syringes, Disposable, U-100 0.5 ML MISC Take insulin as directed 100 each 1  . glimepiride (AMARYL) 4 MG tablet Take 1 tablet (4 mg total) by mouth daily before breakfast. 30 tablet 0  . insulin NPH-regular Human (NOVOLIN 70/30) (70-30) 100 UNIT/ML injection Inject 10 Units into the skin 2 (two) times daily with a meal. 10 mL 0  . metFORMIN (GLUCOPHAGE) 500 MG tablet Take 1 tablet (500 mg total) by mouth 2 (two) times daily with a meal. 60 tablet 0  . oxyCODONE (OXY IR/ROXICODONE) 5 MG immediate release  tablet Take 1 tablet (5 mg total) by mouth every 6 (six) hours as needed for severe pain. 25 tablet 0   No facility-administered medications prior to visit.     Review of Systems  Constitutional: Negative.   HENT: Negative.   Eyes: Negative.   Respiratory: Negative.   Cardiovascular: Negative.   Gastrointestinal: Negative.   Endocrine: Negative.   Genitourinary: Negative.   Musculoskeletal: Negative.   Allergic/Immunologic: Negative.   Neurological: Negative.   Hematological: Negative.   Psychiatric/Behavioral: Negative.        Objective:   Physical Exam Vitals:   01/01/20 0916  BP: 138/89  Pulse: 92  Resp: 17  Temp: 98.4 F (36.9 C)  TempSrc: Temporal  SpO2: 97%  Weight: 187 lb 3.2 oz (84.9 kg)  Height: 5' 5" (1.651 m)  No flowsheet data found.  Note current GAD-7 and PHQ-9 are low PHQ9 SCORE ONLY 01/01/2020 08/27/2018 08/21/2017  PHQ-9 Total Score 0 0 1     Gen: Pleasant, well-nourished, in no distress,  normal affect  ENT: No lesions, dentition is adequate with cavities having been filled and no active periodontal disease,  mouth clear,  oropharynx clear, no postnasal drip  Neck: No JVD, no TMG, no carotid bruits  Lungs: No use of accessory muscles, no dullness to percussion, clear without rales or rhonchi, slight tenderness over the anterior lateral 10th and 11th ribs but markedly improved from prior exams  Cardiovascular: RRR, heart sounds normal, no  murmur or gallops, no peripheral edema  Abdomen: soft and NT, no HSM,  BS normal, no residual abdominal tenderness and no enlarged liver seen on this exam  Musculoskeletal: No deformities, no cyanosis or clubbing  Neuro: alert, non focal  Skin: Warm, no lesions or rashes  Foot exam is performed and shows a decreased sensation to monofilament in the heel and front foot area but good pulses in dorsalis pedis and posterior tibialis areas.  No skin defect.  Dryness of the heels.  Some elements of fungal involvement of the toenails   imaging:  Imaging Results (Last 48 hours) DG Chest 1 View  Result Date: 12/05/2019 CLINICAL DATA:  Fall. EXAM: CHEST  1 VIEW COMPARISON:  Chest x-ray dated Dec 01, 2017. FINDINGS: The heart size and mediastinal contours are within normal limits. Both lungs are clear. The visualized skeletal structures are unremarkable. IMPRESSION: No active disease. Electronically Signed   By: Titus Dubin M.D.   On: 12/05/2019 14:03   DG Pelvis 1-2 Views  Result Date: 12/05/2019 CLINICAL DATA:  Fall. EXAM: PELVIS - 1-2 VIEW COMPARISON:  CT a chest, abdomen, and pelvis dated July 31, 2017. FINDINGS: There is no evidence of pelvic fracture or diastasis. No pelvic bone lesions are seen. Chronic soft tissue calcification lateral to the right acetabulum. IMPRESSION: No acute osseous abnormality. Electronically Signed   By: Titus Dubin M.D.   On: 12/05/2019 14:00   DG Elbow Complete Left  Result Date: 12/05/2019 CLINICAL DATA:  Fall. EXAM: LEFT ELBOW - COMPLETE 3+ VIEW COMPARISON:  None. FINDINGS: There is no evidence of fracture, dislocation, or joint effusion. There is no evidence of arthropathy or other focal bone abnormality. Triceps enthesophyte. Soft tissues are unremarkable. IMPRESSION: Negative. Electronically Signed   By: Titus Dubin M.D.   On: 12/05/2019 14:03   CT Chest W Contrast  Result Date: 12/05/2019 CLINICAL DATA:  Abdominal trauma, fall from ladder  Tuesday EXAM: CT CHEST, ABDOMEN, AND PELVIS WITH CONTRAST TECHNIQUE: Multidetector CT imaging of the chest, abdomen  and pelvis was performed following the standard protocol during bolus administration of intravenous contrast. CONTRAST:  171m OMNIPAQUE IOHEXOL 300 MG/ML  SOLN COMPARISON:  CT angiogram chest abdomen pelvis, 07/30/2017 FINDINGS: CT CHEST FINDINGS Cardiovascular: No significant vascular findings. Normal heart size. No pericardial effusion. Mediastinum/Nodes: No enlarged mediastinal, hilar, or axillary lymph nodes. Thyroid gland, trachea, and esophagus demonstrate no significant findings. Lungs/Pleura: Trace left pleural effusion associated atelectasis or consolidation. Musculoskeletal: No chest wall mass. Minimally displaced fractures of the posterior left tenth and eleventh ribs (series 3, image 53). CT ABDOMEN PELVIS FINDINGS Hepatobiliary: Hepatomegaly, maximum coronal span 22 cm. Hepatic steatosis. No gallstones, gallbladder wall thickening, or biliary dilatation. Pancreas: Unremarkable. No pancreatic ductal dilatation or surrounding inflammatory changes. Spleen: Normal in size without significant abnormality. Adrenals/Urinary Tract: Adrenal glands are unremarkable. There is a subcapsular hematoma about the left kidney, measuring approximately 3.1 cm in thickness (series 3, image 64). The right kidney is normal. No hydronephrosis. Bladder is unremarkable. Stomach/Bowel: Stomach is within normal limits. The appendix is fluid-filled, measuring up to 1.2 cm in caliber (series 3, image 93). There is a fluid-filled lesion at the right cecal base adjacent to the appendiceal os measuring 2.8 x 2.3 cm (series 3, image 91). No evidence of bowel wall thickening, distention, or inflammatory changes. Vascular/Lymphatic: No significant vascular findings are present. No enlarged abdominal or pelvic lymph nodes. Reproductive: No mass or other abnormality. Other: No abdominal wall hernia or abnormality. No  abdominopelvic ascites. Small left retroperitoneal hematoma measuring up to 1.3 cm in thickness (series 3, image 93). Musculoskeletal: No acute or significant osseous findings. IMPRESSION: 1. Minimally displaced fractures of the posterior left tenth and eleventh ribs. Trace left pleural effusion with associated atelectasis or consolidation. 2. There is a subcapsular hematoma about the left kidney, measuring approximately 3.1 cm in thickness. 3. Small adjacent left retroperitoneal hematoma measuring up to 1.3 cm in thickness. 4. The appendix is fluid-filled, measuring up to 1.2 cm in caliber. There is a fluid-filled lesion at the right cecal base adjacent to the appendiceal os measuring 2.8 x 2.3 cm. 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 Recommend nonemergent surgical consultation due to malignant potential. 5. Hepatomegaly and hepatic steatosis. These results were called by telephone at the time of interpretation on 12/05/2019 at 4:46 pm to PBrawley, who verbally acknowledged these results. Electronically Signed   By: AEddie CandleM.D.   On: 12/05/2019 16:50   CT ABDOMEN PELVIS W CONTRAST  Result Date: 12/05/2019 CLINICAL DATA:  Abdominal trauma, fall from ladder Tuesday EXAM: CT CHEST, ABDOMEN, AND PELVIS WITH CONTRAST TECHNIQUE: Multidetector CT imaging of the chest, abdomen and pelvis was performed following the standard protocol during bolus administration of intravenous contrast. CONTRAST:  1013mOMNIPAQUE IOHEXOL 300 MG/ML  SOLN COMPARISON:  CT angiogram chest abdomen pelvis, 07/30/2017 FINDINGS: CT CHEST FINDINGS Cardiovascular: No significant vascular findings. Normal heart size. No pericardial effusion. Mediastinum/Nodes: No enlarged mediastinal, hilar, or axillary lymph nodes. Thyroid gland, trachea, and esophagus demonstrate no significant findings. Lungs/Pleura: Trace left pleural effusion associated atelectasis or  consolidation. Musculoskeletal: No chest wall mass. Minimally displaced fractures of the posterior left tenth and eleventh ribs (series 3, image 53). CT ABDOMEN PELVIS FINDINGS Hepatobiliary: Hepatomegaly, maximum coronal span 22 cm. Hepatic steatosis. No gallstones, gallbladder wall thickening, or biliary dilatation. Pancreas: Unremarkable. No pancreatic ductal dilatation or surrounding inflammatory changes. Spleen: Normal in size without significant abnormality. Adrenals/Urinary Tract: Adrenal glands are unremarkable. There is  a subcapsular hematoma about the left kidney, measuring approximately 3.1 cm in thickness (series 3, image 64). The right kidney is normal. No hydronephrosis. Bladder is unremarkable. Stomach/Bowel: Stomach is within normal limits. The appendix is fluid-filled, measuring up to 1.2 cm in caliber (series 3, image 93). There is a fluid-filled lesion at the right cecal base adjacent to the appendiceal os measuring 2.8 x 2.3 cm (series 3, image 91). No evidence of bowel wall thickening, distention, or inflammatory changes. Vascular/Lymphatic: No significant vascular findings are present. No enlarged abdominal or pelvic lymph nodes. Reproductive: No mass or other abnormality. Other: No abdominal wall hernia or abnormality. No abdominopelvic ascites. Small left retroperitoneal hematoma measuring up to 1.3 cm in thickness (series 3, image 93). Musculoskeletal: No acute or significant osseous findings. IMPRESSION: 1. Minimally displaced fractures of the posterior left tenth and eleventh ribs. Trace left pleural effusion with associated atelectasis or consolidation. 2. There is a subcapsular hematoma about the left kidney, measuring approximately 3.1 cm in thickness. 3. Small adjacent left retroperitoneal hematoma measuring up to 1.3 cm in thickness. 4. The appendix is fluid-filled, measuring up to 1.2 cm in caliber. There is a fluid-filled lesion at the right cecal base adjacent to the appendiceal os  measuring 2.8 x 2.3 cm. 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 Recommend nonemergent surgical consultation due to malignant potential. 5. Hepatomegaly and hepatic steatosis. These results were called by telephone at the time of interpretation on 12/05/2019 at 4:46 pm to [place] , who verbally acknowledged these results. Electronically Signed   By: Eddie Candle M.D.   On: 12/05/2019 16:50   DG Knee Complete 4 Views Left  Result Date: 12/05/2019 CLINICAL DATA:  Fall. EXAM: LEFT KNEE - COMPLETE 4+ VIEW COMPARISON:  None. FINDINGS: No evidence of fracture, dislocation, or joint effusion. No evidence of arthropathy or other focal bone abnormality. Soft tissues are unremarkable. IMPRESSION: Negative. Electronically Signed   By: Titus Dubin M.D.   On: 12/05/2019 14:01   DG Knee Complete 4 Views Right  Result Date: 12/05/2019 CLINICAL DATA:  Fall. EXAM: RIGHT KNEE - COMPLETE 4+ VIEW COMPARISON:  None. FINDINGS: No evidence of fracture, dislocation, or joint effusion. No evidence of arthropathy or other focal bone abnormality. Soft tissues are unremarkable. IMPRESSION: Negative. Electronically Signed   By: Titus Dubin M.D.   On: 12/05/2019 14:00       Assessment & Plan:  I personally reviewed all images and lab data in the [facility] system as well as any outside material available during this office visit and agree with the  radiology impressions.   Essential hypertension Hypertension currently not on medications and will need to be resumed  We will begin lisinopril 10 mg daily  DM (diabetes mellitus) type 2, uncontrolled Diabetes poorly controlled  We will follow-up metabolic profile and change NPH to Lantus 20 units daily and increase Metformin to 1000 mg twice daily and discontinue Amaryl to allow for improved patient adherence  Patient to return to see our clinical pharmacist in 2 weeks and will return to see me in 1  month  Neuropathy Neuropathy both lower extremities secondary to poorly controlled diabetes  We will resume gabapentin 100 mg 3 times daily  Renal hematoma Renal hematoma which has resolved  Hyperlipidemia Hyperlipidemia secondary to diabetes will resume atorvastatin 20 mg daily   Brendyn was seen today for establish care and hospitalization follow-up.  Diagnoses and all orders for this visit:  DM (diabetes mellitus) type 2, uncontrolled -     metFORMIN (GLUCOPHAGE) 1000 MG tablet; Take 1 tablet (1,000 mg total) by mouth 2 (two) times daily with a meal. -     Comprehensive metabolic panel -     Microalbumin, urine  Encounter to establish care  Hospital discharge follow-up  Closed fracture of multiple ribs of left side, initial encounter  Traumatic hematoma of left kidney -     CBC with Differential/Platelet  Essential hypertension -     Comprehensive metabolic panel  Pure hypercholesterolemia  Need for hepatitis C screening test -     Hepatitis c antibody (reflex)  Neuropathy  Hematoma of left kidney, subsequent encounter  Other orders -     Discontinue: insulin glargine (LANTUS) 100 unit/mL SOPN; Inject 0.2 mLs (20 Units total) into the skin daily. -     lisinopril (ZESTRIL) 10 MG tablet; Take 1 tablet (10 mg total) by mouth daily. -     gabapentin (NEURONTIN) 100 MG capsule; Take 1 capsule (100 mg total) by mouth 3 (three) times daily. -     atorvastatin (LIPITOR) 20 MG tablet; Take 1 tablet (20 mg total) by mouth daily. -     insulin glargine (LANTUS) 100 unit/mL SOPN; Inject 0.2 mLs (20 Units total) into the skin daily.

## 2020-01-01 NOTE — Assessment & Plan Note (Signed)
Hyperlipidemia secondary to diabetes will resume atorvastatin 20 mg daily

## 2020-01-01 NOTE — Assessment & Plan Note (Signed)
Diabetes poorly controlled  We will follow-up metabolic profile and change NPH to Lantus 20 units daily and increase Metformin to 1000 mg twice daily and discontinue Amaryl to allow for improved patient adherence  Patient to return to see our clinical pharmacist in 2 weeks and will return to see me in 1 month

## 2020-01-01 NOTE — Assessment & Plan Note (Signed)
Neuropathy both lower extremities secondary to poorly controlled diabetes  We will resume gabapentin 100 mg 3 times daily

## 2020-01-01 NOTE — Patient Instructions (Signed)
Stop NPH Start Lantus 20 units daily at bedtime Increase Metformin to 1000mg  twice daily Stop amaryl Start Gabapentin 100mg  three times daily Start lisinopril 10mg  daily Start atorvastatin 20mg  daily   Labs today Urine microalbumin, hep C   Follow diet below   See our clinical pharmacist in two weeks Jacob Schneider  See Dr in one month   Diabetes mellitus y nutricin, en adultos Diabetes Mellitus and Nutrition, Adult Si sufre de diabetes (diabetes mellitus), es muy importante tener hbitos alimenticios saludables debido a que sus niveles de sangre (glucosa) se ven afectados en gran medida por lo que come y bebe. [place] alimentos saludables en las cantidades [place], aproximadamente a la , Delford Field ayudar a:  Psychologist, counselling glucemia.  Disminuir el riesgo de sufrir una enfermedad cardaca.  Mejorar la presin arterial.  [place] o mantener un peso saludable. Todas las personas que sufren de diabetes son diferentes y cada una tiene necesidades diferentes en cuanto a un plan de alimentacin. El mdico puede recomendarle que trabaje con un especialista en dietas y nutricin (nutricionista) para [employer] plan para usted. Su plan de alimentacin puede variar segn factores como:  Las caloras que necesita.  Los medicamentos que toma.  Su peso.  Sus niveles de glucemia, presin arterial y colesterol.  Su nivel de [place].  Otras afecciones que tenga, como enfermedades cardacas o renales. Cmo me afectan los carbohidratos? Los carbohidratos, o hidratos de carbono, afectan su nivel de glucemia ms que cualquier otro tipo de alimento. La ingesta de carbohidratos naturalmente aumenta la cantidad de Scientist, physiological. El recuento de carbohidratos es un mtodo destinado a Barista un registro de la cantidad de carbohidratos que se consumen. El recuento de carbohidratos es importante para Tax adviser la glucemia a un nivel saludable, especialmente  si utiliza insulina o toma determinados medicamentos por va oral para la diabetes. Es importante conocer la cantidad de carbohidratos que se pueden ingerir en cada comida sin correr [place]. Esto es [employer]. Su nutricionista puede ayudarlo a calcular la cantidad de carbohidratos que debe ingerir en cada comida y en cada refrigerio. Entre los alimentos que contienen carbohidratos, se incluyen:  Pan, cereal, arroz, pastas y galletas.  Papas y maz.  Guisantes, frijoles y lentejas.  Leche y Midwife.  Pharmacologist y Surveyor, minerals.  Postres, como pasteles, galletas, helado y caramelos. Cmo me afecta el alcohol? El alcohol puede provocar disminuciones sbitas de la glucemia (hipoglucemia), especialmente si utiliza insulina o toma determinados medicamentos por va oral para la diabetes. La hipoglucemia es una afeccin potencialmente mortal. Los sntomas de la hipoglucemia (somnolencia, mareos y confusin) son similares a los sntomas de haber consumido demasiado alcohol. Si el mdico afirma que el alcohol es seguro para usted, Government social research officer estas pautas:  Limite el consumo de alcohol a no ms de Dentist por da si es mujer y no est [place], y a [place] si es hombre. Una medida equivale a 12oz ([place]) de cerveza, 5oz ( ) de vino o 1oz (58ml) de bebidas alcohlicas de alta graduacin.  No beba con el estmago vaco.  Mantngase hidratado bebiendo agua, refrescos dietticos o t helado sin azcar.  Tenga en cuenta que los refrescos comunes, los jugos y otras bebida para pueden contener mucha azcar y se deben contar como carbohidratos. Cules son algunos consejos para seguir este plan?  Leer las etiquetas de los alimentos  Comience por leer el tamao de la porcin en la "Informacin nutricional" en  las etiquetas de los alimentos envasados y las bebidas. La cantidad de caloras, carbohidratos, grasas y otros nutrientes mencionados en la etiqueta se basan en una porcin  del alimento. Muchos alimentos contienen ms de una porcin por envase.  Verifique la cantidad total de gramos (g) de carbohidratos totales en una porcin. Puede calcular la cantidad de porciones de carbohidratos al dividir el total de carbohidratos por 15. Por ejemplo, si un alimento tiene un total de 30g de carbohidratos, equivale a 2 porciones de carbohidratos.  Verifique la cantidad de gramos (g) de grasas saturadas y grasas trans en una porcin. Escoja alimentos que no contengan grasa o que tengan un bajo contenido.  Verifique la cantidad de miligramos (mg) de sal (sodio) en una porcin. [place] [employer] de las personas deben limitar la ingesta de sodio total a menos de 2300mg  por .  Siempre consulte la informacin nutricional de los alimentos etiquetados como "con bajo contenido de grasa" o "sin grasa". Estos alimentos pueden tener un mayor contenido de Futures trader agregada o carbohidratos refinados, y deben evitarse.  Hable con su nutricionista para identificar sus objetivos diarios en cuanto a los nutrientes mencionados en la etiqueta. Al ir de compras  Evite comprar alimentos procesados, enlatados o precocinados. Estos alimentos tienden a International aid/development worker mayor cantidad de [place], sodio y azcar agregada.  Compre en la zona exterior de la tienda de comestibles. Esta zona incluye frutas y verduras frescas, granos a granel, carnes frescas y productos lcteos frescos. Al cocinar  Utilice mtodos de coccin a baja temperatura, como hornear, en lugar de mtodos de coccin a alta temperatura, como frer en abundante aceite.  Cocine con aceites saludables, como el aceite de [place], canola o [place].  Evite cocinar con manteca, crema o carnes con alto contenido de grasa. Planificacin de las comidas  Coma las comidas y los refrigerios regularmente, preferentemente a la misma hora todos [place]. Evite pasar largos perodos de tiempo sin comer.  Consuma alimentos ricos en fibra, como frutas frescas,  verduras, frijoles y cereales integrales. Consulte a su nutricionista sobre cuntas porciones de carbohidratos puede consumir en cada comida.  Consuma entre 4 y 6 onzas (oz) de protenas magras por da, como carnes [place], pollo, pescado, huevos o tofu. Una onza de protena magra equivale a: ? 1 onza de carne, pollo o pescado. ? 1huevo. ?  taza de tofu.  Coma algunos alimentos por da que contengan grasas saludables, como aguacates, frutos secos, semillas y pescado. Estilo de vida  Controle su nivel de glucemia con regularidad.  Haga actividad fsica habitualmente como se lo haya indicado el mdico. Esto puede incluir lo siguiente: ? [place] semanales de ejercicio de intensidad moderada o alta. Esto podra incluir caminatas dinmicas, ciclismo o gimnasia acutica. ? Realizar ejercicios de elongacin y de fortalecimiento, como yoga o levantamiento de pesas, por lo menos 2veces por semana.  Tome los se lo haya indicado el mdico.  No consuma ningn producto que contenga nicotina o tabaco, como cigarrillos y [employer]. Si necesita ayuda para dejar de fumar, consulte al Administrator, Civil Service con un asesor o instructor en diabetes para identificar estrategias para controlar el estrs y cualquier desafo emocional y social. Preguntas para hacerle al mdico  Es necesario que consulte a CIGNA en el cuidado de la diabetes?  Es necesario que me rena con un nutricionista?  A qu nmero puedo llamar si tengo preguntas?  Cules son los mejores momentos para controlar la glucemia? Dnde encontrar ms informacin:  Asociacin  Estadounidense de la Diabetes (American Diabetes Association): diabetes.org  Academia de Nutricin y Pension scheme manager (Academy of Nutrition and Dietetics): www.eatright.org  [employer] de la Diabetes y las Enfermedades Digestivas y [facility] of Diabetes and Digestive and Kidney Diseases, NIH):  CarFlippers.tn Resumen  Un plan de alimentacin saludable lo ayudar a Scientist, physiological glucemia y Pharmacologist un estilo de vida saludable.  Trabajar con un especialista en dietas y nutricin (nutricionista) puede ayudarlo a Designer, television/film set de alimentacin para usted.  Tenga en cuenta que los carbohidratos (hidratos de carbono) y el alcohol tienen efectos inmediatos en sus niveles de glucemia. Es importante contar los carbohidratos que ingiere y consumir alcohol con prudencia. Esta informacin no tiene Theme park manager el consejo del mdico. Asegrese de hacerle al mdico cualquier pregunta que tenga. Document Revised: 02/28/2017 Document Reviewed: 10/10/2016 Elsevier Patient Education  2020 [employer].

## 2020-01-01 NOTE — Assessment & Plan Note (Signed)
Renal hematoma which has resolved

## 2020-01-02 LAB — CBC WITH DIFFERENTIAL/PLATELET
Basophils Absolute: 0 10*3/uL (ref 0.0–0.2)
Basos: 1 %
EOS (ABSOLUTE): 0.3 10*3/uL (ref 0.0–0.4)
Eos: 4 %
Hematocrit: 41.5 % (ref 37.5–51.0)
Hemoglobin: 13 g/dL (ref 13.0–17.7)
Immature Grans (Abs): 0.1 10*3/uL (ref 0.0–0.1)
Immature Granulocytes: 1 %
Lymphocytes Absolute: 1.8 10*3/uL (ref 0.7–3.1)
Lymphs: 22 %
MCH: 25.8 pg — ABNORMAL LOW (ref 26.6–33.0)
MCHC: 31.3 g/dL — ABNORMAL LOW (ref 31.5–35.7)
MCV: 82 fL (ref 79–97)
Monocytes Absolute: 0.6 10*3/uL (ref 0.1–0.9)
Monocytes: 7 %
Neutrophils Absolute: 5.5 10*3/uL (ref 1.4–7.0)
Neutrophils: 65 %
Platelets: 387 10*3/uL (ref 150–450)
RBC: 5.04 x10E6/uL (ref 4.14–5.80)
RDW: 13.3 % (ref 11.6–15.4)
WBC: 8.3 10*3/uL (ref 3.4–10.8)

## 2020-01-02 LAB — COMPREHENSIVE METABOLIC PANEL
ALT: 11 IU/L (ref 0–44)
AST: 12 IU/L (ref 0–40)
Albumin/Globulin Ratio: 1.5 (ref 1.2–2.2)
Albumin: 4.6 g/dL (ref 4.0–5.0)
Alkaline Phosphatase: 126 IU/L — ABNORMAL HIGH (ref 48–121)
BUN/Creatinine Ratio: 20 (ref 9–20)
BUN: 15 mg/dL (ref 6–24)
Bilirubin Total: <0.2 mg/dL (ref 0.0–1.2)
CO2: 23 mmol/L (ref 20–29)
Calcium: 9.9 mg/dL (ref 8.7–10.2)
Chloride: 100 mmol/L (ref 96–106)
Creatinine, Ser: 0.76 mg/dL (ref 0.76–1.27)
GFR calc Af Amer: 128 mL/min/{1.73_m2} (ref >59)
GFR calc non Af Amer: 111 mL/min/{1.73_m2} (ref >59)
Globulin, Total: 3.1 g/dL (ref 1.5–4.5)
Glucose: 119 mg/dL — ABNORMAL HIGH (ref 65–99)
Potassium: 4.7 mmol/L (ref 3.5–5.2)
Sodium: 140 mmol/L (ref 134–144)
Total Protein: 7.7 g/dL (ref 6.0–8.5)

## 2020-01-02 LAB — HCV COMMENT:

## 2020-01-02 LAB — MICROALBUMIN, URINE: Microalbumin, Urine: 22.6 ug/mL

## 2020-01-02 LAB — HEPATITIS C ANTIBODY (REFLEX): HCV Ab: <0.1 s/co ratio (ref 0.0–0.9)

## 2020-01-16 ENCOUNTER — Ambulatory Visit: Payer: Self-pay | Attending: Family Medicine | Admitting: Pharmacist

## 2020-01-16 ENCOUNTER — Other Ambulatory Visit: Payer: Self-pay

## 2020-01-16 DIAGNOSIS — E1110 Type 2 diabetes mellitus with ketoacidosis without coma: Secondary | ICD-10-CM

## 2020-01-16 MED ORDER — TRUEPLUS 5-BEVEL PEN NEEDLES 32G X 4 MM MISC: 2 refills | Status: DC

## 2020-01-16 MED ORDER — LANTUS SOLOSTAR 100 UNIT/ML ~~LOC~~ SOPN: 20.0000 [IU] | PEN_INJECTOR | Freq: Every day | SUBCUTANEOUS | 2 refills | Status: DC

## 2020-01-16 MED FILL — TRUEplus 5-BEVEL PEN NEEDLE: 32G X 4 MM | 25 days supply | Qty: 100 | Fill #0

## 2020-01-16 MED FILL — !LANTUS SOLOSTAR 100UNITS/M: 100 | 30 days supply | Qty: 6 | Fill #0

## 2020-01-17 ENCOUNTER — Encounter: Payer: Self-pay | Admitting: Pharmacist

## 2020-01-17 NOTE — Progress Notes (Signed)
    S:    PCP: Dr. Delford Field No chief complaint on file.  Patient arrives in good spirits.  Presents for diabetes evaluation, education, and management.  Patient was referred and last seen by Primary Care Provider on 01/01/2020. At that visit, NPH was changed to Lantus. And metformin dose was increased. The pt was started on an ACEi, statin, and gabapentin as well.   Patient reports Diabetes was diagnosed in June of this year.   Family/Social History:  - FHx: DM - Tobacco: former smoker - Alcohol: endorses drinking 4 beers/day  Insurance coverage/medication affordability: self pay  Medication adherence denied. Pt did not pick up Lantus and has been taking 10 units BID of NPH.   Current diabetes medications include: Lantus 20 units (not taking), metformin 1000 mg BID. Current hypertension medications include: lisinopril 10 mg daily  Current hyperlipidemia medications include: atorvastatin 20 mg daily   Patient denies hypoglycemic events.  Patient reported dietary habits:  - Reports that he watches what he eats but admits to struggling with carbs   Patient-reported exercise habits: none    Patient denies nocturia (nighttime urination).  Patient denies changes in neuropathy (nerve pain). Patient denies visual changes. Patient reports self foot exams.     O: Lab Results  Component Value Date   HGBA1C 12.3 (H) 12/05/2019   There were no vitals filed for this visit.  Lipid Panel     Component Value Date/Time   CHOL 141 08/21/2017 1044   TRIG 154 (H) 08/21/2017 1044   HDL 33 (L) 08/21/2017 1044   CHOLHDL 4.3 08/21/2017 1044   LDLCALC 77 08/21/2017 1044   Home fasting blood sugars: reports low 100s-190s.  2 hour post-meal/random blood sugars: 140s-160s.  Clinical Atherosclerotic Cardiovascular Disease (ASCVD): No  The 10-year ASCVD risk score Denman George DC Jr., et al., 2013) is: 4.3%   Values used to calculate the score:     Age: 24 years     Sex: Male     Is Non-Hispanic  African American: No     Diabetic: Yes     Tobacco smoker: No     Systolic Blood Pressure: 138 mmHg     Is BP treated: Yes     HDL Cholesterol: 33 mg/dL     Total Cholesterol: 141 mg/dL    A/P: Diabetes newly dx currently uncontrolled. Patient is able to verbalize appropriate hypoglycemia management plan. Medication adherence reported. Control is suboptimal due to some dietary indiscretion, physical inactivity, and medication nonadherence.  -Discontinued NPH; disposed for the pt.  -Directed him to our pharmacy to obtain Lantus pens. Injection technique covered.  -Continued metformin. -Extensively discussed pathophysiology of diabetes, recommended lifestyle interventions, dietary effects on blood sugar control -Counseled on s/sx of and management of hypoglycemia -Next A1C anticipated 03/2020.   ASCVD risk - primary prevention in patient with diabetes. Last LDL is close to goal but was taken in 2019. Needs updated lipid panel. ASCVD risk score is not >20%  - moderate intensity statin indicated for now.  -Continued atorvastatin 20 mg.   HM: needs Covid vaccine. Other vaccines UTD.   Written patient instructions provided.  Total time in face to face counseling 30 minutes.   Follow up Pharmacist Clinic Visit in 1 week.    Butch Penny, PharmD, CPP Clinical Pharmacist [facility] [phone]

## 2020-01-23 ENCOUNTER — Other Ambulatory Visit: Payer: Self-pay

## 2020-01-23 ENCOUNTER — Ambulatory Visit: Payer: Self-pay | Attending: Family Medicine | Admitting: Pharmacist

## 2020-01-23 DIAGNOSIS — E1110 Type 2 diabetes mellitus with ketoacidosis without coma: Secondary | ICD-10-CM

## 2020-01-23 LAB — GLUCOSE, POCT (MANUAL RESULT ENTRY): POC Glucose: 186 mg/dl — AB (ref 70–99)

## 2020-01-23 MED ORDER — TRULICITY 0.75 MG/0.5ML ~~LOC~~ SOAJ: 0.7500 mg | SUBCUTANEOUS | 0 refills | Status: DC

## 2020-01-23 MED FILL — TRULICITY 0.75 MG/0.5 ML PE: 0.75 | 28 days supply | Qty: 2 | Fill #0

## 2020-01-23 NOTE — Progress Notes (Signed)
    S:    PCP: Dr. Delford Field No chief complaint on file.  Patient arrives in good spirits.  Presents for diabetes evaluation, education, and management.  Patient was referred and last seen by Primary Care Provider on 01/01/2020. At that visit, NPH was changed to Lantus. Metformin dose was increased. The pt was started on an ACEi, statin, and gabapentin as well. I saw him last week and provided education.   Patient reports Diabetes was diagnosed in June of this year.   Family/Social History:  - FHx: DM - Tobacco: former smoker - Alcohol: endorses drinking 4 beers/day  Insurance coverage/medication affordability: self pay  Medication adherence reported. Current diabetes medications include: Lantus 20 units, metformin 1000 mg BID. Current hypertension medications include: lisinopril 10 mg daily  Current hyperlipidemia medications include: atorvastatin 20 mg daily   Patient denies hypoglycemic events.  Patient reported dietary habits:  - Reports that he watches what he eats but admits to struggling with carbs   Patient-reported exercise habits: none    Patient denies nocturia (nighttime urination).  Patient denies changes in neuropathy (nerve pain). Patient denies visual changes. Patient reports self foot exams.     O: POCT: 186  Lab Results  Component Value Date   HGBA1C 12.3 (H) 12/05/2019   There were no vitals filed for this visit.  Lipid Panel     Component Value Date/Time   CHOL 141 08/21/2017 1044   TRIG 154 (H) 08/21/2017 1044   HDL 33 (L) 08/21/2017 1044   CHOLHDL 4.3 08/21/2017 1044   LDLCALC 77 08/21/2017 1044   Home fasting blood sugars: 80-90s in the past week since starting Lantus.  2 hour post-meal/random blood sugars: 140s-200s.  Clinical Atherosclerotic Cardiovascular Disease (ASCVD): No  The 10-year ASCVD risk score Denman George DC Jr., et al., 2013) is: 4.3%   Values used to calculate the score:     Age: 44 years     Sex: Male     Is Non-Hispanic  African American: No     Diabetic: Yes     Tobacco smoker: No     Systolic Blood Pressure: 138 mmHg     Is BP treated: Yes     HDL Cholesterol: 33 mg/dL     Total Cholesterol: 141 mg/dL    A/P: Diabetes newly dx currently uncontrolled. Patient is able to verbalize appropriate hypoglycemia management plan. Medication adherence reported. Control is suboptimal due to some dietary indiscretion, physical inactivity, and medication nonadherence.  -Start Trulicity 0.75 mg weekly. Counseled on injection technique. -Continued Lantus, metformin at current doses. -Extensively discussed pathophysiology of diabetes, recommended lifestyle interventions, dietary effects on blood sugar control -Counseled on s/sx of and management of hypoglycemia -Next A1C anticipated 03/2020.   ASCVD risk - primary prevention in patient with diabetes. Last LDL is close to goal but was taken in 2019. Needs updated lipid panel. ASCVD risk score is not >20%  - moderate intensity statin indicated for now.  -Continued atorvastatin 20 mg.  -Lipid, future  HM: needs Covid vaccine. Other vaccines UTD.   Written patient instructions provided.  Total time in face to face counseling 30 minutes.   Follow up with PCP in Aug.   Butch Penny, PharmD, CPP Clinical Pharmacist [facility] [phone]

## 2020-02-04 ENCOUNTER — Ambulatory Visit: Payer: Self-pay | Admitting: Pharmacist

## 2020-02-04 ENCOUNTER — Ambulatory Visit: Payer: Self-pay | Admitting: Critical Care Medicine

## 2020-02-05 ENCOUNTER — Other Ambulatory Visit: Payer: Self-pay

## 2020-02-05 ENCOUNTER — Encounter: Payer: Self-pay | Admitting: Critical Care Medicine

## 2020-02-05 ENCOUNTER — Ambulatory Visit: Payer: Self-pay | Attending: Critical Care Medicine | Admitting: Critical Care Medicine

## 2020-02-05 ENCOUNTER — Other Ambulatory Visit: Payer: Self-pay | Admitting: Critical Care Medicine

## 2020-02-05 VITALS — BP 147/99 | HR 89 | Temp 97.6°F | Resp 16 | Ht 65.0 in | Wt 190.0 lb

## 2020-02-05 DIAGNOSIS — E1110 Type 2 diabetes mellitus with ketoacidosis without coma: Secondary | ICD-10-CM

## 2020-02-05 DIAGNOSIS — K388 Other specified diseases of appendix: Secondary | ICD-10-CM | POA: Insufficient documentation

## 2020-02-05 DIAGNOSIS — G629 Polyneuropathy, unspecified: Secondary | ICD-10-CM

## 2020-02-05 DIAGNOSIS — E7800 Pure hypercholesterolemia, unspecified: Secondary | ICD-10-CM

## 2020-02-05 DIAGNOSIS — I10 Essential (primary) hypertension: Secondary | ICD-10-CM

## 2020-02-05 HISTORY — DX: Other specified diseases of appendix: K38.8

## 2020-02-05 LAB — POCT GLYCOSYLATED HEMOGLOBIN (HGB A1C): Hemoglobin A1C: 8 % — AB (ref 4.0–5.6)

## 2020-02-05 LAB — GLUCOSE, POCT (MANUAL RESULT ENTRY): POC Glucose: 128 mg/dl — AB (ref 70–99)

## 2020-02-05 MED ORDER — TRULICITY 0.75 MG/0.5ML ~~LOC~~ SOAJ: 0.7500 mg | SUBCUTANEOUS | 0 refills | Status: DC

## 2020-02-05 MED ORDER — ATORVASTATIN CALCIUM 20 MG PO TABS: 20.0000 mg | ORAL_TABLET | Freq: Every day | ORAL | 3 refills | Status: DC

## 2020-02-05 MED ORDER — METFORMIN HCL 1000 MG PO TABS: 1000.0000 mg | ORAL_TABLET | Freq: Two times a day (BID) | ORAL | 4 refills | Status: DC

## 2020-02-05 MED ORDER — LISINOPRIL 30 MG PO TABS: 30.0000 mg | ORAL_TABLET | Freq: Every day | ORAL | 3 refills | Status: DC

## 2020-02-05 MED ORDER — LANTUS SOLOSTAR 100 UNIT/ML ~~LOC~~ SOPN: 20.0000 [IU] | PEN_INJECTOR | Freq: Every day | SUBCUTANEOUS | 2 refills | Status: DC

## 2020-02-05 MED ORDER — GABAPENTIN 100 MG PO CAPS: 100.0000 mg | ORAL_CAPSULE | Freq: Three times a day (TID) | ORAL | 3 refills | Status: DC

## 2020-02-05 MED FILL — LISINOPRIL 30 MG TABLET: 30 | 30 days supply | Qty: 30 | Fill #0

## 2020-02-05 MED FILL — metFORMIN HCL 1000 MG TABS: 1000 | 30 days supply | Qty: 60 | Fill #0

## 2020-02-05 MED FILL — GABAPENTIN 100 MG CAPSULE: 100 | 30 days supply | Qty: 90 | Fill #0

## 2020-02-05 MED FILL — ATORVASTATIN CALCIUM 20 MG: 20 | 30 days supply | Qty: 30 | Fill #0

## 2020-02-05 NOTE — Assessment & Plan Note (Signed)
Diabetes type 2 with improved control hemoglobin A1c now down to 8.0  Plan is to send the patient to dietitian for further education on improving his diet he also will see an eye specialist for his retina exam  Patient to continue Trulicity Metformin as prescribed along with the Lantus at 20 units daily

## 2020-02-05 NOTE — Progress Notes (Signed)
Here for DM F /u

## 2020-02-05 NOTE — Assessment & Plan Note (Signed)
Hypertension poorly controlled we will increase lisinopril to 30 mg daily

## 2020-02-05 NOTE — Patient Instructions (Signed)
Come to [facility] to receive your cov[facility] vaccine:    [address]  Refills on all medications will be sent to our pharmacy.  Lisinopril is increased to 30mg  daily  A referral to a nutritionist for diabetes will be given  Return Dr 2 months   La diabetes mellitus y el cuidado de los pies Diabetes Mellitus and Foot Care El cuidado de los pies es un aspecto importante de la salud, especialmente si tiene diabetes. La diabetes puede generar problemas debido a que el flujo sanguneo (circulacin) es deficiente en las piernas y los pies, y esto puede hacer que la piel:  Se torne ms fina y Delford Field.  Se resquebraje ms fcilmente.  Cicatrice ms lentamente.  Se descame y agriete. Tambin pueden estar daados los nervios (neuropata) de las piernas y de los pies, lo que provoca una disminucin de la sensibilidad. En consecuencia, es posible que no advierta heridas pequeas en los pies que pueden causar problemas ms graves. Identificar y tratar cualquier complicacin lo antes posible es la mejor manera de evitar futuros problemas de pie. Cmo cuidar los pies Higiene de los pies  Magazine features editor pies todos los [employer] con agua tibia y un [place]. No use agua caliente. Luego squese los pies y [place] dedos dando palmaditas, hasta que estn completamente secos. No remoje los pies, ya que esto puede resecar la piel.  Crtese las uas de los pies en lnea recta. No escarbe debajo de las uas o alrededor [employer]. Lime los bordes de las uas con una lima o esmeril.  Aplique una locin hidratante o vaselina en la piel de los pies y en las uas secas y [employer]. Use una locin que no contenga alcohol ni fragancias. No aplique locin entre los dedos. Zapatos y calcetines  Use calcetines de algodn o medias limpias todos los [place]. Asegrese de que no le KALIX. No use calcetines que le lleguen a las rodillas, ya que podran disminuir el flujo  de sangre a las piernas.  Use zapatos de cuero que le queden bien y que sean acolchados. Revise siempre los zapatos antes de ponerlos para asegurarse de que no haya objetos en su interior.  Para amoldar los zapatos, clcelos solo algunas horas por da. Esto evitar lesiones en los pies. Heridas, rasguos, durezas y callosidades  Controle sus pies diariamente para observar si hay ampollas, cortes, moretones, llagas o enrojecimiento. Si no puede ver la planta del pie, use un espejo o pdale ayuda a [employer].  No corte las durezas o callosidades, ni trate de quitarlas con medicamentos.  Si algo le ha raspado, cortado o lastimado la piel de los pies, mantenga la piel de esa zona limpia y [place]. Puede higienizar estas zonas con agua y un jabn suave. No limpie la zona con agua oxigenada, alcohol ni yodo.  Si tiene una herida, un rasguo, una dureza o una callosidad en el pie, revsela varias veces al da para asegurarse de que se est curando y no se infecte. Est atento a los siguientes signos: ? Dolor, hinchazn o enrojecimiento. ? Lquido o sangre. ? Calor. ? Pus o mal olor. Instrucciones generales  No se cruce de piernas. Esto puede disminuir el flujo de sangre a los pies.  No use bolsas de agua caliente ni almohadillas trmicas en los pies. Podran causar quemaduras. Si ha perdido la sensibilidad en los pies o las piernas, no sabr lo que le est sucediendo 10-30-1992  que sea demasiado tarde.  Proteja sus pies del calor y del fro con calzado, en la playa o sobre el pavimento caliente.  Programe una cita para un examen completo de los pies por lo menos una vez al ao (anualmente) o con ms frecuencia si tiene [employer] Rx. Si tiene Care[employer] Rx, infrmele al mdico de [employer] cortes, las llagas o los moretones. Comunquese con un mdico si:  Tiene una afeccin que aumenta su riesgo de tener infecciones y tiene cortes, llagas o moretones en los pies.  Tiene  una lesin que no se [place].  Tiene una zona irritada en las piernas o los pies.  Siente una sensacin de ardor u hormigueo en las piernas o los pies.  Siente dolor o calambres en las piernas o los pies.  Las piernas o los pies estn adormecidos.  Siente los pies siempre fros.  Siente dolor alrededor de una ua del pie. Solicite ayuda de inmediato si:  Tiene una herida, un rasguo, una dureza o una callosidad en el pie y: ? Licensed conveyancer, hinchazn o enrojecimiento que empeora. ? Le sale lquido o sangre de la herida, el rasguo, la dureza o la callosidad. ? La herida, el rasguo, la dureza o la callosidad est caliente al tacto. ? Le sale pus o mal olor de la herida, el rasguo, la dureza o la callosidad. ? Tiene fiebre. ? Tiene una lnea roja que sube por la pierna. Resumen  Controle todos los [address] de sus pies para observar si hay cortes, llagas, manchas rojas, hinchazn o ampollas.  Humctese los pies y las piernas a diario.  Use zapatos de cuero que le queden bien y que sean acolchados.  Si tiene Care[employer] Rx, infrmele al mdico de [employer] cortes, las llagas o los moretones.  Programe una cita para un examen completo de los pies por lo menos una vez al ao (anualmente) o con ms frecuencia si tiene Caremark Rx. Esta informacin no tiene Theme park manager el consejo del mdico. Asegrese de hacerle al mdico cualquier pregunta que tenga. Document Revised: 02/10/2017 Document Reviewed: 02/10/2017 Elsevier Patient Education  2020 [employer].

## 2020-02-05 NOTE — Assessment & Plan Note (Signed)
Hyperlipidemia we will continue the atorvastatin at 20 mg daily

## 2020-02-05 NOTE — Assessment & Plan Note (Signed)
If he continues to improve the control of his diabetesPlan to continue gabapentin 100 mg 3 times daily for neuropathy he will in fact see improvement in the neuropathy symptoms

## 2020-02-05 NOTE — Progress Notes (Signed)
Subjective:    Patient ID: Jacob Schneider, male    DOB: April 12, 1976, 44 y.o.   MRN: [id number]  44 y.o.M Latino the patient is accompanied by his friend who serves also as an interpreter  Patient comes today as a post hospital follow-up and to establish care.  Patient has a history of hypertension diagnosed for 9 years but was unaware he had diabetes.  Patient also has history of alcoholic gastritis.  He has been out of his medications for hypertension for some time during the pandemic.  He previously had been followed for primary care but lost contact and during the pandemic.  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  He was found to have diabetes out-of-control and was discharged on NPH twice daily Metformin twice daily and Amaryl daily.  Patient returns today in follow-up.  He is off his blood pressure medicines.  Blood pressure is ranging slightly elevated at this visit.  He states his blood sugars at home of been in the 1 72-80 range.  He states he has been compliant with the NPH.  Note during this hospitalization he was found to have fat in the liver and an enlarged appendix that would need further follow-up.  Patient has a prior history of excessive ethanol use and had history of alcohol delirium previously that has resolved this was associated with DKA as well as electrolyte disturbances.  That admission was in 2018.  Below is the discharge summary of the most recent admission   Admit date: 12/05/2019 Discharge date: 12/07/2019  Admitting Diagnosis: Subcapsular hematoma of kidney 2 rib fractures Diabetes with hyperglycemia  Discharge Diagnosis Fall 48 hrs prior to admit Left 10-11 rib fxs RTP/ L perinephric hematoma DM  Consultants None  Procedures None  Hospital Course:  Jacob Schneider is a 44yo male  h/o HTN and DM off his medications for several months, who presented to [facility] 6/3 after he was working at a house when he missed his footing and fell down the stairs. He fell on his left side. He did not lose consciousness. He has had worsening pain on the left side over the last 2 days so came into the hospital. He denies dizziness. Pain is worse with deep breathing.  Workup showed Subcapsular hematoma of kidney and 2 rib fractures.  Patient was admitted to the trauma service for pain control and observation. H/H monitored and remained stable.  Patient worked with therapies during this admission. On 6/5 the patient was tolerating diet, ambulating well, pain well controlled, vital signs stable and felt stable for discharge home.  Patient will follow up as below and knows to call with questions or concerns.  We will send 1 month supply of his diabetes medications, and he knows to follow up with PCP to discuss this and his hypertension.    Note this patient comes in with pictures of his old prescriptions showing he had been on atorvastatin 20 mg lisinopril 2.5 mg HCTZ 25 mg and gabapentin 100 mg in the past.  The patient states he does eat quite a bit of carbohydrates for breakfast lunch and dinner.  He states he is no longer drinking alcohol  02/05/2020 This patient was seen in return follow-up for diabetes and hypertension.  He states at home his diabetes control is showing glucose of  1 50-1 60 he did have Trulicity added in July of this year.  He is on 75 mcg weekly of this along with the Lantus and Metformin.  Today he comes in with a hemoglobin A1c of 8.0 noted had been 12 in June so there is an improvement here.  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  He is currently trying to finish his application for his orange card for the surgery referral.  Patient states his diet still has heavy  carbohydrate intake.  Note he is willing to get the Covid vaccine and we told him where he can go for this vaccination.  Patient currently is not drinking any alcohol.    Past Medical History:  Diagnosis Date  . Alcohol withdrawal delirium, acute, hyperactive ([place]) 03/30/2015  . Anxiety   . Chronic lower back pain   . Diabetes mellitus type II, uncontrolled ([place])    "I think it's type 1" (07/31/2017)  . DKA (diabetic ketoacidoses) ([place]) 07/30/2017  . History of stomach ulcers   . Hypertension   . Renal hematoma 12/05/2019     Family History  Problem Relation Age of Onset  . Diabetes Father      Social History   Socioeconomic History  . Marital status: Single    Spouse name: Not on file  . Number of children: Not on file  . Years of education: Not on file  . Highest education level: Not on file  Occupational History  . Not on file  Tobacco Use  . Smoking status: Former Smoker    Packs/day: 0.10    Years: 28.00    Pack years: 2.80    Types: Cigarettes  . Smokeless tobacco: Never Used  . Tobacco comment: 07/31/2017 "stopped before Thanksgiving"  Vaping Use  . Vaping Use: Never used  Substance and Sexual Activity  . Alcohol use: Yes    Alcohol/week: 28.0 standard drinks    Types: 28 Cans of beer per week    Comment: 07/31/2017 "4 beers/day on average"  . Drug use: Not Currently    Types: Cocaine    Comment: 07/31/2017 "none since ~ 05/2017"  . Sexual activity: Yes    Birth control/protection: None  Other Topics Concern  . Not on file  Social History Narrative  . Not on file   Social Determinants of Health   Financial Resource Strain:   . Difficulty of Paying Living Expenses:   Food Insecurity:   . Worried About Charity fundraiser in the Last Year:   . Arboriculturist in the Last Year:   Transportation Needs:   . Film/video editor (Medical):   Marland Kitchen Lack of Transportation (Non-Medical):   Physical Activity:   . Days of Exercise per Week:   . Minutes of  Exercise per Session:   Stress:   . Feeling of Stress :   Social Connections:   . Frequency of Communication with Friends and Family:   . Frequency of Social Gatherings with Friends and Family:   . Attends Religious Services:   . Active Member of Clubs or Organizations:   . Attends Archivist Meetings:   Marland Kitchen Marital Status:   Intimate Partner Violence:   . Fear of Current or Ex-Partner:   . Emotionally Abused:   Marland Kitchen Physically Abused:   . Sexually Abused:      No Known Allergies  Outpatient Medications Prior to Visit  Medication Sig Dispense Refill  . acetaminophen (TYLENOL) 500 MG tablet Take 2 tablets (1,000 mg total) by mouth every 6 (six) hours as needed for mild pain. 30 tablet 0  . blood glucose meter kit and supplies KIT Dispense based on patient and insurance preference. Daily in AM 1 each 0  . Insulin Pen Needle (TRUEPLUS 5-BEVEL PEN NEEDLES) 32G X 4 MM MISC Use as instructed to inject Lantus once daily. 100 each 2  . atorvastatin (LIPITOR) 20 MG tablet Take 1 tablet (20 mg total) by mouth daily. 90 tablet 3  . Dulaglutide (TRULICITY) 6.29 BM/8.4XL SOPN Inject 0.5 mLs (0.75 mg total) into the skin once a week. 2 mL 0  . gabapentin (NEURONTIN) 100 MG capsule Take 1 capsule (100 mg total) by mouth 3 (three) times daily. 90 capsule 3  . insulin glargine (LANTUS SOLOSTAR) 100 UNIT/ML Solostar Pen Inject 20 Units into the skin daily. 6 mL 2  . lisinopril (ZESTRIL) 10 MG tablet Take 1 tablet (10 mg total) by mouth daily. 30 tablet 3  . metFORMIN (GLUCOPHAGE) 1000 MG tablet Take 1 tablet (1,000 mg total) by mouth 2 (two) times daily with a meal. 60 tablet 4   No facility-administered medications prior to visit.     Review of Systems  Constitutional: Negative.   HENT: Negative.   Eyes: Negative.   Respiratory: Negative.   Cardiovascular: Negative.   Gastrointestinal: Negative.   Endocrine: Negative.   Genitourinary: Negative.   Musculoskeletal: Negative.    Allergic/Immunologic: Negative.   Neurological: Negative.   Hematological: Negative.   Psychiatric/Behavioral: Negative.        Objective:   Physical Exam Vitals:   02/05/20 0856  BP: (!) 147/99  Pulse: 89  Resp: 16  Temp: 97.6 F (36.4 C)  SpO2: 100%  Weight: 190 lb (86.2 kg)  Height: '5\' 5"'$  (1.651 m)   GAD 7 : Generalized Anxiety Score 02/05/2020  Nervous, Anxious, on Edge 0  Control/stop worrying 0  Worry too much - different things 0  Trouble relaxing 0  Restless 0  Easily annoyed or irritable 0  Afraid - awful might happen 0  Total GAD 7 Score 0    Note current GAD-7 and PHQ-9 are low PHQ9 SCORE ONLY 02/05/2020 01/01/2020 08/27/2018  PHQ-9 Total Score 0 0 0     Gen: Pleasant, well-nourished, in no distress,  normal affect  ENT: No lesions, dentition is adequate with cavities having been filled and no active periodontal disease,  mouth clear,  oropharynx clear, no postnasal drip  Neck: No JVD, no TMG, no carotid bruits  Lungs: No use of accessory muscles, no dullness to percussion, chest is completely clear Cardiovascular: RRR, heart sounds normal, no murmur or gallops, no peripheral edema  Abdomen: soft and NT, no HSM,  BS normal, no residual abdominal tenderness and no enlarged liver seen on this exam  Musculoskeletal: No deformities, no cyanosis or clubbing  Neuro: alert, non focal  Skin: Warm, no lesions or rashes   imaging:  Imaging Results (Last 48 hours) DG Chest 1 View  Result Date: 12/05/2019 CLINICAL DATA:  Fall. EXAM: CHEST  1 VIEW COMPARISON:  Chest x-ray dated Dec 01, 2017. FINDINGS: The heart size and mediastinal contours are within normal limits. Both lungs are clear. The visualized skeletal structures are unremarkable. IMPRESSION: No active disease. Electronically Signed   By: Titus Dubin M.D.   On: 12/05/2019 14:03   DG Pelvis 1-2 Views  Result  Date: 12/05/2019 CLINICAL DATA:  Fall. EXAM: PELVIS - 1-2 VIEW COMPARISON:  CT a chest,  abdomen, and pelvis dated July 31, 2017. FINDINGS: There is no evidence of pelvic fracture or diastasis. No pelvic bone lesions are seen. Chronic soft tissue calcification lateral to the right acetabulum. IMPRESSION: No acute osseous abnormality. Electronically Signed   By: Titus Dubin M.D.   On: 12/05/2019 14:00   DG Elbow Complete Left  Result Date: 12/05/2019 CLINICAL DATA:  Fall. EXAM: LEFT ELBOW - COMPLETE 3+ VIEW COMPARISON:  None. FINDINGS: There is no evidence of fracture, dislocation, or joint effusion. There is no evidence of arthropathy or other focal bone abnormality. Triceps enthesophyte. Soft tissues are unremarkable. IMPRESSION: Negative. Electronically Signed   By: Titus Dubin M.D.   On: 12/05/2019 14:03   CT Chest W Contrast  Result Date: 12/05/2019 CLINICAL DATA:  Abdominal trauma, fall from ladder Tuesday EXAM: CT CHEST, ABDOMEN, AND PELVIS WITH CONTRAST TECHNIQUE: Multidetector CT imaging of the chest, abdomen and pelvis was performed following the standard protocol during bolus administration of intravenous contrast. CONTRAST:  125m OMNIPAQUE IOHEXOL 300 MG/ML  SOLN COMPARISON:  CT angiogram chest abdomen pelvis, 07/30/2017 FINDINGS: CT CHEST FINDINGS Cardiovascular: No significant vascular findings. Normal heart size. No pericardial effusion. Mediastinum/Nodes: No enlarged mediastinal, hilar, or axillary lymph nodes. Thyroid gland, trachea, and esophagus demonstrate no significant findings. Lungs/Pleura: Trace left pleural effusion associated atelectasis or consolidation. Musculoskeletal: No chest wall mass. Minimally displaced fractures of the posterior left tenth and eleventh ribs (series 3, image 53). CT ABDOMEN PELVIS FINDINGS Hepatobiliary: Hepatomegaly, maximum coronal span 22 cm. Hepatic steatosis. No gallstones, gallbladder wall thickening, or biliary dilatation. Pancreas: Unremarkable. No pancreatic ductal dilatation or surrounding inflammatory changes. Spleen:  Normal in size without significant abnormality. Adrenals/Urinary Tract: Adrenal glands are unremarkable. There is a subcapsular hematoma about the left kidney, measuring approximately 3.1 cm in thickness (series 3, image 64). The right kidney is normal. No hydronephrosis. Bladder is unremarkable. Stomach/Bowel: Stomach is within normal limits. The appendix is fluid-filled, measuring up to 1.2 cm in caliber (series 3, image 93). There is a fluid-filled lesion at the right cecal base adjacent to the appendiceal os measuring 2.8 x 2.3 cm (series 3, image 91). No evidence of bowel wall thickening, distention, or inflammatory changes. Vascular/Lymphatic: No significant vascular findings are present. No enlarged abdominal or pelvic lymph nodes. Reproductive: No mass or other abnormality. Other: No abdominal wall hernia or abnormality. No abdominopelvic ascites. Small left retroperitoneal hematoma measuring up to 1.3 cm in thickness (series 3, image 93). Musculoskeletal: No acute or significant osseous findings. IMPRESSION: 1. Minimally displaced fractures of the posterior left tenth and eleventh ribs. Trace left pleural effusion with associated atelectasis or consolidation. 2. There is a subcapsular hematoma about the left kidney, measuring approximately 3.1 cm in thickness. 3. Small adjacent left retroperitoneal hematoma measuring up to 1.3 cm in thickness. 4. The appendix is fluid-filled, measuring up to 1.2 cm in caliber. There is a fluid-filled lesion at the right cecal base adjacent to the appendiceal os measuring 2.8 x 2.3 cm. 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 Recommend nonemergent surgical consultation due to malignant potential. 5. Hepatomegaly and hepatic steatosis. These results were called by telephone at the time of interpretation on 12/05/2019 at 4:46 pm to [place], who verbally acknowledged these results. Electronically Signed    By: AEddie CandleM.D.   On: 12/05/2019 16:50   CT ABDOMEN  PELVIS W CONTRAST  Result Date: 12/05/2019 CLINICAL DATA:  Abdominal trauma, fall from ladder Tuesday EXAM: CT CHEST, ABDOMEN, AND PELVIS WITH CONTRAST TECHNIQUE: Multidetector CT imaging of the chest, abdomen and pelvis was performed following the standard protocol during bolus administration of intravenous contrast. CONTRAST:  138m OMNIPAQUE IOHEXOL 300 MG/ML  SOLN COMPARISON:  CT angiogram chest abdomen pelvis, 07/30/2017 FINDINGS: CT CHEST FINDINGS Cardiovascular: No significant vascular findings. Normal heart size. No pericardial effusion. Mediastinum/Nodes: No enlarged mediastinal, hilar, or axillary lymph nodes. Thyroid gland, trachea, and esophagus demonstrate no significant findings. Lungs/Pleura: Trace left pleural effusion associated atelectasis or consolidation. Musculoskeletal: No chest wall mass. Minimally displaced fractures of the posterior left tenth and eleventh ribs (series 3, image 53). CT ABDOMEN PELVIS FINDINGS Hepatobiliary: Hepatomegaly, maximum coronal span 22 cm. Hepatic steatosis. No gallstones, gallbladder wall thickening, or biliary dilatation. Pancreas: Unremarkable. No pancreatic ductal dilatation or surrounding inflammatory changes. Spleen: Normal in size without significant abnormality. Adrenals/Urinary Tract: Adrenal glands are unremarkable. There is a subcapsular hematoma about the left kidney, measuring approximately 3.1 cm in thickness (series 3, image 64). The right kidney is normal. No hydronephrosis. Bladder is unremarkable. Stomach/Bowel: Stomach is within normal limits. The appendix is fluid-filled, measuring up to 1.2 cm in caliber (series 3, image 93). There is a fluid-filled lesion at the right cecal base adjacent to the appendiceal os measuring 2.8 x 2.3 cm (series 3, image 91). No evidence of bowel wall thickening, distention, or inflammatory changes. Vascular/Lymphatic: No significant vascular findings are  present. No enlarged abdominal or pelvic lymph nodes. Reproductive: No mass or other abnormality. Other: No abdominal wall hernia or abnormality. No abdominopelvic ascites. Small left retroperitoneal hematoma measuring up to 1.3 cm in thickness (series 3, image 93). Musculoskeletal: No acute or significant osseous findings. IMPRESSION: 1. Minimally displaced fractures of the posterior left tenth and eleventh ribs. Trace left pleural effusion with associated atelectasis or consolidation. 2. There is a subcapsular hematoma about the left kidney, measuring approximately 3.1 cm in thickness. 3. Small adjacent left retroperitoneal hematoma measuring up to 1.3 cm in thickness. 4. The appendix is fluid-filled, measuring up to 1.2 cm in caliber. There is a fluid-filled lesion at the right cecal base adjacent to the appendiceal os measuring 2.8 x 2.3 cm. 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 Recommend nonemergent surgical consultation due to malignant potential. 5. Hepatomegaly and hepatic steatosis. These results were called by telephone at the time of interpretation on 12/05/2019 at 4:46 pm to [place], who verbally acknowledged these results. Electronically Signed   By: AEddie CandleM.D.   On: 12/05/2019 16:50   DG Knee Complete 4 Views Left  Result Date: 12/05/2019 CLINICAL DATA:  Fall. EXAM: LEFT KNEE - COMPLETE 4+ VIEW COMPARISON:  None. FINDINGS: No evidence of fracture, dislocation, or joint effusion. No evidence of arthropathy or other focal bone abnormality. Soft tissues are unremarkable. IMPRESSION: Negative. Electronically Signed   By: WTitus DubinM.D.   On: 12/05/2019 14:01   DG Knee Complete 4 Views Right  Result Date: 12/05/2019 CLINICAL DATA:  Fall. EXAM: RIGHT KNEE - COMPLETE 4+ VIEW COMPARISON:  None. FINDINGS: No evidence of fracture, dislocation, or joint effusion. No evidence of arthropathy or other focal bone  abnormality. Soft tissues are unremarkable. IMPRESSION: Negative. Electronically Signed   By: WTitus DubinM.D.   On: 12/05/2019 14:00       Assessment & Plan:  I personally reviewed all images  and lab data in the [facility] system as well as any outside material available during this office visit and agree with the  radiology impressions.   DM (diabetes mellitus) type 2, uncontrolled Diabetes type 2 with improved control hemoglobin A1c now down to 8.0  Plan is to send the patient to dietitian for further education on improving his diet he also will see an eye specialist for his retina exam  Patient to continue Trulicity Metformin as prescribed along with the Lantus at 20 units daily  Essential hypertension Hypertension poorly controlled we will increase lisinopril to 30 mg daily  Neuropathy If he continues to improve the control of his diabetesPlan to continue gabapentin 100 mg 3 times daily for neuropathy he will in fact see improvement in the neuropathy symptoms  Hyperlipidemia Hyperlipidemia we will continue the atorvastatin at 20 mg daily   Diagnoses and all orders for this visit:  Mucocele of appendix  DM (diabetes mellitus) type 2, uncontrolled, with ketoacidosis (HCC) -     HgB A1c -     POCT glucose (manual entry) -     Dulaglutide (TRULICITY) 2.70 JJ/0.0XF SOPN; Inject 0.5 mLs (0.75 mg total) into the skin once a week. -     metFORMIN (GLUCOPHAGE) 1000 MG tablet; Take 1 tablet (1,000 mg total) by mouth 2 (two) times daily with a meal. -     insulin glargine (LANTUS SOLOSTAR) 100 UNIT/ML Solostar Pen; Inject 20 Units into the skin daily. -     Amb Ref to Medical Weight Management  DM (diabetes mellitus) type 2, uncontrolled -     HgB A1c -     POCT glucose (manual entry) -     Dulaglutide (TRULICITY) 8.18 EX/9.3ZJ SOPN; Inject 0.5 mLs (0.75 mg total) into the skin once a week. -     metFORMIN (GLUCOPHAGE) 1000 MG tablet; Take 1 tablet (1,000 mg total) by mouth 2 (two)  times daily with a meal. -     insulin glargine (LANTUS SOLOSTAR) 100 UNIT/ML Solostar Pen; Inject 20 Units into the skin daily. -     Amb Ref to Medical Weight Management  Essential hypertension  Neuropathy  Pure hypercholesterolemia  Other orders -     lisinopril (ZESTRIL) 30 MG tablet; Take 1 tablet (30 mg total) by mouth daily. -     gabapentin (NEURONTIN) 100 MG capsule; Take 1 capsule (100 mg total) by mouth 3 (three) times daily. -     atorvastatin (LIPITOR) 20 MG tablet; Take 1 tablet (20 mg total) by mouth daily.  The patient was encouraged to come to the mobile medicine unit to obtain a Mater[place] vaccine he will take this under advisement

## 2020-02-06 ENCOUNTER — Ambulatory Visit: Payer: Self-pay | Admitting: Pharmacist

## 2020-03-26 ENCOUNTER — Ambulatory Visit: Payer: Self-pay | Admitting: Dietician

## 2020-04-13 MED FILL — METFORMIN HCL 1000 MG TABS: 1000 | 30 days supply | Qty: 60 | Fill #1

## 2020-04-13 MED FILL — ATORVASTATIN CALCIUM 20 MG: 20 | 30 days supply | Qty: 30 | Fill #1

## 2020-04-13 MED FILL — LISINOPRIL 30 MG TABS: 30 | 30 days supply | Qty: 30 | Fill #1

## 2020-04-13 MED FILL — GABAPENTIN 100 MG CAPSULE: 100 | 30 days supply | Qty: 90 | Fill #1

## 2020-05-11 ENCOUNTER — Ambulatory Visit: Payer: Self-pay | Admitting: Dietician

## 2020-05-22 MED FILL — METFORMIN HCL 1000 MG TABS: 1000 | 30 days supply | Qty: 60 | Fill #2

## 2020-05-22 MED FILL — LISINOPRIL 30 MG TABS: 30 | 30 days supply | Qty: 30 | Fill #2

## 2020-05-22 MED FILL — ATORVASTATIN CALCIUM 20 MG: 20 | 30 days supply | Qty: 30 | Fill #2

## 2020-06-18 ENCOUNTER — Other Ambulatory Visit: Payer: Self-pay | Admitting: Critical Care Medicine

## 2020-06-18 ENCOUNTER — Ambulatory Visit: Payer: Self-pay | Attending: Critical Care Medicine | Admitting: Critical Care Medicine

## 2020-06-18 ENCOUNTER — Encounter: Payer: Self-pay | Admitting: Critical Care Medicine

## 2020-06-18 ENCOUNTER — Other Ambulatory Visit: Payer: Self-pay

## 2020-06-18 VITALS — BP 117/79 | HR 84 | Temp 97.7°F | Resp 18 | Ht 65.0 in | Wt 193.8 lb

## 2020-06-18 DIAGNOSIS — Z23 Encounter for immunization: Secondary | ICD-10-CM

## 2020-06-18 DIAGNOSIS — I10 Essential (primary) hypertension: Secondary | ICD-10-CM

## 2020-06-18 DIAGNOSIS — L309 Dermatitis, unspecified: Secondary | ICD-10-CM | POA: Insufficient documentation

## 2020-06-18 DIAGNOSIS — E1142 Type 2 diabetes mellitus with diabetic polyneuropathy: Secondary | ICD-10-CM

## 2020-06-18 DIAGNOSIS — E1110 Type 2 diabetes mellitus with ketoacidosis without coma: Secondary | ICD-10-CM

## 2020-06-18 DIAGNOSIS — K388 Other specified diseases of appendix: Secondary | ICD-10-CM

## 2020-06-18 DIAGNOSIS — E7800 Pure hypercholesterolemia, unspecified: Secondary | ICD-10-CM

## 2020-06-18 LAB — POCT GLYCOSYLATED HEMOGLOBIN (HGB A1C): Hemoglobin A1C: 6.1 % — AB (ref 4.0–5.6)

## 2020-06-18 LAB — GLUCOSE, POCT (MANUAL RESULT ENTRY): POC Glucose: 121 mg/dl — AB (ref 70–99)

## 2020-06-18 MED ORDER — ATORVASTATIN CALCIUM 20 MG PO TABS: 20.0000 mg | ORAL_TABLET | Freq: Every day | ORAL | 3 refills | Status: DC

## 2020-06-18 MED ORDER — GABAPENTIN 100 MG PO CAPS: 100.0000 mg | ORAL_CAPSULE | Freq: Three times a day (TID) | ORAL | 3 refills | Status: DC

## 2020-06-18 MED ORDER — TRULICITY 0.75 MG/0.5ML ~~LOC~~ SOAJ: 0.7500 mg | SUBCUTANEOUS | 0 refills | Status: DC

## 2020-06-18 MED ORDER — LANTUS SOLOSTAR 100 UNIT/ML ~~LOC~~ SOPN: 20.0000 [IU] | PEN_INJECTOR | Freq: Every day | SUBCUTANEOUS | 2 refills | Status: DC

## 2020-06-18 MED ORDER — METFORMIN HCL 1000 MG PO TABS: 1000.0000 mg | ORAL_TABLET | Freq: Two times a day (BID) | ORAL | 4 refills | Status: DC

## 2020-06-18 MED ORDER — LISINOPRIL 30 MG PO TABS: 30.0000 mg | ORAL_TABLET | Freq: Every day | ORAL | 3 refills | Status: DC

## 2020-06-18 MED ORDER — TRIAMCINOLONE ACETONIDE 0.1 % EX CREA
1.0000 | TOPICAL_CREAM | Freq: Two times a day (BID) | CUTANEOUS | 0 refills | Status: DC
Start: 2020-06-18 — End: 2020-06-18

## 2020-06-18 MED FILL — ATORVASTATIN CALCIUM 20 MG: 20 | 30 days supply | Qty: 30 | Fill #0

## 2020-06-18 MED FILL — METFORMIN HCL 1000 MG TABS: 1000 | 30 days supply | Qty: 60 | Fill #0

## 2020-06-18 MED FILL — LISINOPRIL 30 MG TABS: 30 | 30 days supply | Qty: 30 | Fill #0

## 2020-06-18 MED FILL — GABAPENTIN 100 MG CAPSULE: 100 | 30 days supply | Qty: 90 | Fill #0

## 2020-06-18 MED FILL — TRIAMCINOLONE 0.1% CREAM: 0.1 | 7 days supply | Qty: 30 | Fill #0

## 2020-06-18 NOTE — Assessment & Plan Note (Signed)
Dermatitis of the back consistent with dry skin and inflammation  Begin skin moisturizer mixed with triamcinolone cream 1 to twice daily

## 2020-06-18 NOTE — Progress Notes (Signed)
Generalized Itching w/ red spots and dry skin x 1 month   Flu vaccine today

## 2020-06-18 NOTE — Assessment & Plan Note (Signed)
Continue atorvastatin

## 2020-06-18 NOTE — Assessment & Plan Note (Signed)
Type 2 diabetes now well controlled A1c down to 6.1 blood glucoses in the 100-1 20 range  Continue current medications including the Trulicity Lantus and Metformin  I congratulated the patient on achieving his goals and to continue same

## 2020-06-18 NOTE — Assessment & Plan Note (Signed)
History of mucocele of the appendix this will need to be followed up

## 2020-06-18 NOTE — Progress Notes (Signed)
Subjective:    Patient ID: Jacob Schneider, male    DOB: April 12, 1976, 44 y.o.   MRN: [id number]  44 y.o.M Latino the patient is accompanied by his friend who serves also as an interpreter  Patient comes today as a post hospital follow-up and to establish care.  Patient has a history of hypertension diagnosed for 9 years but was unaware he had diabetes.  Patient also has history of alcoholic gastritis.  He has been out of his medications for hypertension for some time during the pandemic.  He previously had been followed for primary care but lost contact and during the pandemic.  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  He was found to have diabetes out-of-control and was discharged on NPH twice daily Metformin twice daily and Amaryl daily.  Patient returns today in follow-up.  He is off his blood pressure medicines.  Blood pressure is ranging slightly elevated at this visit.  He states his blood sugars at home of been in the 1 72-80 range.  He states he has been compliant with the NPH.  Note during this hospitalization he was found to have fat in the liver and an enlarged appendix that would need further follow-up.  Patient has a prior history of excessive ethanol use and had history of alcohol delirium previously that has resolved this was associated with DKA as well as electrolyte disturbances.  That admission was in 2018.  Below is the discharge summary of the most recent admission   Admit date: 12/05/2019 Discharge date: 12/07/2019  Admitting Diagnosis: Subcapsular hematoma of kidney 2 rib fractures Diabetes with hyperglycemia  Discharge Diagnosis Fall 48 hrs prior to admit Left 10-11 rib fxs RTP/ L perinephric hematoma DM  Consultants None  Procedures None  Hospital Course:  Jacob Schneider is a 44yo male  h/o HTN and DM off his medications for several months, who presented to [facility] 6/3 after he was working at a house when he missed his footing and fell down the stairs. He fell on his left side. He did not lose consciousness. He has had worsening pain on the left side over the last 2 days so came into the hospital. He denies dizziness. Pain is worse with deep breathing.  Workup showed Subcapsular hematoma of kidney and 2 rib fractures.  Patient was admitted to the trauma service for pain control and observation. H/H monitored and remained stable.  Patient worked with therapies during this admission. On 6/5 the patient was tolerating diet, ambulating well, pain well controlled, vital signs stable and felt stable for discharge home.  Patient will follow up as below and knows to call with questions or concerns.  We will send 1 month supply of his diabetes medications, and he knows to follow up with PCP to discuss this and his hypertension.    Note this patient comes in with pictures of his old prescriptions showing he had been on atorvastatin 20 mg lisinopril 2.5 mg HCTZ 25 mg and gabapentin 100 mg in the past.  The patient states he does eat quite a bit of carbohydrates for breakfast lunch and dinner.  He states he is no longer drinking alcohol  02/05/2020 This patient was seen in return follow-up for diabetes and hypertension.  He states at home his diabetes control is showing glucose of  1 50-1 60 he did have Trulicity added in July of this year.  He is on 75 mcg weekly of this along with the Lantus and Metformin.  Today he comes in with a hemoglobin A1c of 8.0 noted had been 12 in June so there is an improvement here.  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  He is currently trying to finish his application for his orange card for the surgery referral.  Patient states his diet still has heavy  carbohydrate intake.  Note he is willing to get the Covid vaccine and we told him where he can go for this vaccination.  Patient currently is not drinking any alcohol. 06/18/2020 Patient seen return follow-up doing quite well on arrival A1c is 6.1 blood sugar 120 he does complain of some itching in the back he has been 7 months without alcohol he is no longer drinking no longer smoking as well he is on an exercise program.  He has no specific complaints other than a rash on his back and upper arms. He is here to get his flu vaccine is yet to receive the Covid vaccine series   Past Medical History:  Diagnosis Date  . Alcohol withdrawal delirium, acute, hyperactive ([facility]) 03/30/2015  . Anxiety   . Chronic lower back pain   . Diabetes mellitus type II, uncontrolled ([place])    "I think it's type 1" (07/31/2017)  . DKA (diabetic ketoacidoses) 07/30/2017  . History of stomach ulcers   . Hypertension   . 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 Renal hematoma 12/05/2019     Family History  Problem Relation Age of Onset  . Diabetes Father      Social History   Socioeconomic History  . Marital status: Single    Spouse name: Not on file  . Number of children: Not on file  . Years of education: Not on file  . Highest education level: Not on file  Occupational History  . Not on file  Tobacco Use  . Smoking status: Former Smoker    Packs/day: 0.10    Years: 28.00    Pack years: 2.80    Types: Cigarettes  . Smokeless tobacco: Never Used  . Tobacco comment: 07/31/2017 "stopped before Thanksgiving"  Vaping Use  . Vaping Use: Never used  Substance and Sexual Activity  . Alcohol use: Yes    Alcohol/week: 28.0 standard drinks    Types: 28 Cans of beer per week    Comment: 07/31/2017 "4 beers/day on average"  . Drug use: Not Currently    Types: Cocaine    Comment: 07/31/2017 "none since ~ 05/2017"  . Sexual activity: Yes    Birth control/protection: None  Other Topics Concern  . Not on file   Social History Narrative  . Not on file   Social Determinants of Health   Financial Resource Strain: Not on file  Food Insecurity: Not on file  Transportation Needs: Not on file  Physical Activity: Not on file  Stress: Not on file  Social Connections: Not on file  Intimate Partner Violence: Not on file     No Known Allergies   Outpatient Medications Prior to Visit  Medication Sig Dispense Refill  . acetaminophen (TYLENOL) 500 MG tablet Take 2 tablets (1,000 mg total) by mouth every 6 (six) hours as needed for mild pain. 30 tablet 0  . blood glucose meter  kit and supplies KIT Dispense based on patient and insurance preference. Daily in AM 1 each 0  . Insulin Pen Needle (TRUEPLUS 5-BEVEL PEN NEEDLES) 32G X 4 MM MISC Use as instructed to inject Lantus once daily. 100 each 2  . atorvastatin (LIPITOR) 20 MG tablet Take 1 tablet (20 mg total) by mouth daily. 90 tablet 3  . Dulaglutide (TRULICITY) 9.62 XB/2.8UX SOPN Inject 0.5 mLs (0.75 mg total) into the skin once a week. 2 mL 0  . gabapentin (NEURONTIN) 100 MG capsule Take 1 capsule (100 mg total) by mouth 3 (three) times daily. 90 capsule 3  . insulin glargine (LANTUS SOLOSTAR) 100 UNIT/ML Solostar Pen Inject 20 Units into the skin daily. 6 mL 2  . lisinopril (ZESTRIL) 30 MG tablet Take 1 tablet (30 mg total) by mouth daily. 60 tablet 3  . metFORMIN (GLUCOPHAGE) 1000 MG tablet Take 1 tablet (1,000 mg total) by mouth 2 (two) times daily with a meal. 60 tablet 4   No facility-administered medications prior to visit.     Review of Systems  Constitutional: Negative.   HENT: Negative.   Eyes: Negative.   Respiratory: Negative.   Cardiovascular: Negative.   Gastrointestinal: Negative.   Endocrine: Negative.   Genitourinary: Negative.   Musculoskeletal: Negative.   Allergic/Immunologic: Negative.   Neurological: Negative.   Hematological: Negative.   Psychiatric/Behavioral: Negative.        Objective:   Physical Exam Vitals:    06/18/20 0906  BP: 117/79  Pulse: 84  Resp: 18  Temp: 97.7 F (36.5 C)  TempSrc: Oral  SpO2: 100%  Weight: 193 lb 12.8 oz (87.9 kg)  Height: _0  (1.651 m)   GAD 7 : Generalized Anxiety Score 06/18/2020 02/05/2020  Nervous, Anxious, on Edge 0 0  Control/stop worrying 0 0  Worry too much - different things 0 0  Trouble relaxing 0 0  Restless 0 0  Easily annoyed or irritable 0 0  Afraid - awful might happen 0 0  Total GAD 7 Score 0 0    Note current GAD-7 and PHQ-9 are low PHQ9 SCORE ONLY 06/18/2020 02/05/2020 01/01/2020  PHQ-9 Total Score 0 0 0     Gen: Pleasant, well-nourished, in no distress,  normal affect  ENT: No lesions, dentition is adequate with cavities having been filled and no active periodontal disease,  mouth clear,  oropharynx clear, no postnasal drip  Neck: No JVD, no TMG, no carotid bruits  Lungs: No use of accessory muscles, no dullness to percussion, chest is completely clear Cardiovascular: RRR, heart sounds normal, no murmur or gallops, no peripheral edema  Abdomen: soft and NT, no HSM,  BS normal, no residual abdominal tenderness and no enlarged liver seen on this exam  Musculoskeletal: No deformities, no cyanosis or clubbing  Neuro: alert, non focal  Skin: Warm, no lesions or rashes   imaging:  Imaging Results (Last 48 hours) DG Chest 1 View  Result Date: 12/05/2019 CLINICAL DATA:  Fall. EXAM: CHEST  1 VIEW COMPARISON:  Chest x-ray dated Dec 01, 2017. FINDINGS: The heart size and mediastinal contours are within normal limits. Both lungs are clear. The visualized skeletal structures are unremarkable. IMPRESSION: No active disease. Electronically Signed   By: Titus Dubin M.D.   On: 12/05/2019 14:03   DG Pelvis 1-2 Views  Result Date: 12/05/2019 CLINICAL DATA:  Fall. EXAM: PELVIS - 1-2 VIEW COMPARISON:  CT a chest, abdomen, and pelvis dated July 31, 2017. FINDINGS: There is no evidence of  pelvic fracture or diastasis. No pelvic bone  lesions are seen. Chronic soft tissue calcification lateral to the right acetabulum. IMPRESSION: No acute osseous abnormality. Electronically Signed   By: Titus Dubin M.D.   On: 12/05/2019 14:00   DG Elbow Complete Left  Result Date: 12/05/2019 CLINICAL DATA:  Fall. EXAM: LEFT ELBOW - COMPLETE 3+ VIEW COMPARISON:  None. FINDINGS: There is no evidence of fracture, dislocation, or joint effusion. There is no evidence of arthropathy or other focal bone abnormality. Triceps enthesophyte. Soft tissues are unremarkable. IMPRESSION: Negative. Electronically Signed   By: Titus Dubin M.D.   On: 12/05/2019 14:03   CT Chest W Contrast  Result Date: 12/05/2019 CLINICAL DATA:  Abdominal trauma, fall from ladder Tuesday EXAM: CT CHEST, ABDOMEN, AND PELVIS WITH CONTRAST TECHNIQUE: Multidetector CT imaging of the chest, abdomen and pelvis was performed following the standard protocol during bolus administration of intravenous contrast. CONTRAST:  113m OMNIPAQUE IOHEXOL 300 MG/ML  SOLN COMPARISON:  CT angiogram chest abdomen pelvis, 07/30/2017 FINDINGS: CT CHEST FINDINGS Cardiovascular: No significant vascular findings. Normal heart size. No pericardial effusion. Mediastinum/Nodes: No enlarged mediastinal, hilar, or axillary lymph nodes. Thyroid gland, trachea, and esophagus demonstrate no significant findings. Lungs/Pleura: Trace left pleural effusion associated atelectasis or consolidation. Musculoskeletal: No chest wall mass. Minimally displaced fractures of the posterior left tenth and eleventh ribs (series 3, image 53). CT ABDOMEN PELVIS FINDINGS Hepatobiliary: Hepatomegaly, maximum coronal span 22 cm. Hepatic steatosis. No gallstones, gallbladder wall thickening, or biliary dilatation. Pancreas: Unremarkable. No pancreatic ductal dilatation or surrounding inflammatory changes. Spleen: Normal in size without significant abnormality. Adrenals/Urinary Tract: Adrenal glands are unremarkable. There is a  subcapsular hematoma about the left kidney, measuring approximately 3.1 cm in thickness (series 3, image 64). The right kidney is normal. No hydronephrosis. Bladder is unremarkable. Stomach/Bowel: Stomach is within normal limits. The appendix is fluid-filled, measuring up to 1.2 cm in caliber (series 3, image 93). There is a fluid-filled lesion at the right cecal base adjacent to the appendiceal os measuring 2.8 x 2.3 cm (series 3, image 91). No evidence of bowel wall thickening, distention, or inflammatory changes. Vascular/Lymphatic: No significant vascular findings are present. No enlarged abdominal or pelvic lymph nodes. Reproductive: No mass or other abnormality. Other: No abdominal wall hernia or abnormality. No abdominopelvic ascites. Small left retroperitoneal hematoma measuring up to 1.3 cm in thickness (series 3, image 93). Musculoskeletal: No acute or significant osseous findings. IMPRESSION: 1. Minimally displaced fractures of the posterior left tenth and eleventh ribs. Trace left pleural effusion with associated atelectasis or consolidation. 2. There is a subcapsular hematoma about the left kidney, measuring approximately 3.1 cm in thickness. 3. Small adjacent left retroperitoneal hematoma measuring up to 1.3 cm in thickness. 4. The appendix is fluid-filled, measuring up to 1.2 cm in caliber. There is a fluid-filled lesion at the right cecal base adjacent to the appendiceal os measuring 2.8 x 2.3 cm. 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 Recommend nonemergent surgical consultation due to malignant potential. 5. Hepatomegaly and hepatic steatosis. These results were called by telephone at the time of interpretation on 12/05/2019 at 4:46 pm to [place], who verbally acknowledged these results. Electronically Signed   By: AEddie CandleM.D.   On: 12/05/2019 16:50   CT ABDOMEN PELVIS W CONTRAST  Result Date: 12/05/2019 CLINICAL  DATA:  Abdominal trauma, fall from ladder Tuesday EXAM: CT CHEST, ABDOMEN, AND PELVIS WITH CONTRAST TECHNIQUE: Multidetector CT imaging of  the chest, abdomen and pelvis was performed following the standard protocol during bolus administration of intravenous contrast. CONTRAST:  144mL OMNIPAQUE IOHEXOL 300 MG/ML  SOLN COMPARISON:  CT angiogram chest abdomen pelvis, 07/30/2017 FINDINGS: CT CHEST FINDINGS Cardiovascular: No significant vascular findings. Normal heart size. No pericardial effusion. Mediastinum/Nodes: No enlarged mediastinal, hilar, or axillary lymph nodes. Thyroid gland, trachea, and esophagus demonstrate no significant findings. Lungs/Pleura: Trace left pleural effusion associated atelectasis or consolidation. Musculoskeletal: No chest wall mass. Minimally displaced fractures of the posterior left tenth and eleventh ribs (series 3, image 53). CT ABDOMEN PELVIS FINDINGS Hepatobiliary: Hepatomegaly, maximum coronal span 22 cm. Hepatic steatosis. No gallstones, gallbladder wall thickening, or biliary dilatation. Pancreas: Unremarkable. No pancreatic ductal dilatation or surrounding inflammatory changes. Spleen: Normal in size without significant abnormality. Adrenals/Urinary Tract: Adrenal glands are unremarkable. There is a subcapsular hematoma about the left kidney, measuring approximately 3.1 cm in thickness (series 3, image 64). The right kidney is normal. No hydronephrosis. Bladder is unremarkable. Stomach/Bowel: Stomach is within normal limits. The appendix is fluid-filled, measuring up to 1.2 cm in caliber (series 3, image 93). There is a fluid-filled lesion at the right cecal base adjacent to the appendiceal os measuring 2.8 x 2.3 cm (series 3, image 91). No evidence of bowel wall thickening, distention, or inflammatory changes. Vascular/Lymphatic: No significant vascular findings are present. No enlarged abdominal or pelvic lymph nodes. Reproductive: No mass or other abnormality. Other: No  abdominal wall hernia or abnormality. No abdominopelvic ascites. Small left retroperitoneal hematoma measuring up to 1.3 cm in thickness (series 3, image 93). Musculoskeletal: No acute or significant osseous findings. IMPRESSION: 1. Minimally displaced fractures of the posterior left tenth and eleventh ribs. Trace left pleural effusion with associated atelectasis or consolidation. 2. There is a subcapsular hematoma about the left kidney, measuring approximately 3.1 cm in thickness. 3. Small adjacent left retroperitoneal hematoma measuring up to 1.3 cm in thickness. 4. The appendix is fluid-filled, measuring up to 1.2 cm in caliber. There is a fluid-filled lesion at the right cecal base adjacent to the appendiceal os measuring 2.8 x 2.3 cm. 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 Recommend nonemergent surgical consultation due to malignant potential. 5. Hepatomegaly and hepatic steatosis. These results were called by telephone at the time of interpretation on 12/05/2019 at 4:46 pm to [place] , who verbally acknowledged these results. Electronically Signed   By: Eddie Candle M.D.   On: 12/05/2019 16:50   DG Knee Complete 4 Views Left  Result Date: 12/05/2019 CLINICAL DATA:  Fall. EXAM: LEFT KNEE - COMPLETE 4+ VIEW COMPARISON:  None. FINDINGS: No evidence of fracture, dislocation, or joint effusion. No evidence of arthropathy or other focal bone abnormality. Soft tissues are unremarkable. IMPRESSION: Negative. Electronically Signed   By: Titus Dubin M.D.   On: 12/05/2019 14:01   DG Knee Complete 4 Views Right  Result Date: 12/05/2019 CLINICAL DATA:  Fall. EXAM: RIGHT KNEE - COMPLETE 4+ VIEW COMPARISON:  None. FINDINGS: No evidence of fracture, dislocation, or joint effusion. No evidence of arthropathy or other focal bone abnormality. Soft tissues are unremarkable. IMPRESSION: Negative. Electronically Signed   By: Titus Dubin  M.D.   On: 12/05/2019 14:00       Assessment & Plan:  I personally reviewed all images and lab data in the [facility] system as well as any outside material available during this office visit and agree with the  radiology impressions.   Essential hypertension  Hypertension under excellent control continue current medications without change  Controlled type 2 diabetes mellitus (HCC) Type 2 diabetes now well controlled A1c down to 6.1 blood glucoses in the 100-1 20 range  Continue current medications including the Trulicity Lantus and Metformin  I congratulated the patient on achieving his goals and to continue same  Hyperlipidemia Continue atorvastatin  Mucocele of appendix History of mucocele of the appendix this will need to be followed up  Dermatitis Dermatitis of the back consistent with dry skin and inflammation  Begin skin moisturizer mixed with triamcinolone cream 1 to twice daily   Jacob Schneider was seen today for follow-up.  Diagnoses and all orders for this visit:  DM (diabetes mellitus) type 2, uncontrolled, with ketoacidosis (HCC) -     Glucose (CBG) -     HgB A1c -     insulin glargine (LANTUS SOLOSTAR) 100 UNIT/ML Solostar Pen; Inject 20 Units into the skin daily. -     metFORMIN (GLUCOPHAGE) 1000 MG tablet; Take 1 tablet (1,000 mg total) by mouth 2 (two) times daily with a meal. -     Dulaglutide (TRULICITY) 8.52 DP/8.2UM SOPN; Inject 0.75 mg into the skin once a week. (Patient not taking: Reported on 06/18/2020)  Flu vaccine need -     Flu Vaccine QUAD 6+ mos PF IM (Fluarix Quad PF)  DM (diabetes mellitus) type 2, uncontrolled -     Glucose (CBG) -     HgB A1c -     insulin glargine (LANTUS SOLOSTAR) 100 UNIT/ML Solostar Pen; Inject 20 Units into the skin daily. -     metFORMIN (GLUCOPHAGE) 1000 MG tablet; Take 1 tablet (1,000 mg total) by mouth 2 (two) times daily with a meal. -     Dulaglutide (TRULICITY) 3.53 IR/4.4RX SOPN; Inject 0.75 mg into the skin once a  week. (Patient not taking: Reported on 06/18/2020)  Essential hypertension  Controlled type 2 diabetes mellitus with diabetic polyneuropathy, without long-term current use of insulin (HCC)  Pure hypercholesterolemia  Mucocele of appendix  Dermatitis  Other orders -     lisinopril (ZESTRIL) 30 MG tablet; Take 1 tablet (30 mg total) by mouth daily. -     atorvastatin (LIPITOR) 20 MG tablet; Take 1 tablet (20 mg total) by mouth daily. -     gabapentin (NEURONTIN) 100 MG capsule; Take 1 capsule (100 mg total) by mouth 3 (three) times daily. -     triamcinolone (KENALOG) 0.1 %; Apply 1 application topically 2 (two) times daily.

## 2020-06-18 NOTE — Assessment & Plan Note (Signed)
Hypertension under excellent control continue current medications without change

## 2020-06-18 NOTE — Patient Instructions (Signed)
No change in medications  Please obtain a Covid vaccine I recommend the Pfizer, you can call the number below to find out where you can obtain COVID-19 Vaccine Information can be found at: [email or web address] For questions related to vaccine distribution or appointments, please email [email or web address] or call [phone].   I sent triamcinolone cream to our pharmacy apply a small amount of this into some skin moisturizer and apply to your affected areas on your arms and back and chest once to twice daily Any skin moisturizer is adequate such as Aveeno or Eucerin   Return Dr. Delford Field 4 months

## 2020-07-27 MED FILL — ATORVASTATIN CALCIUM 20 MG: 20 | 30 days supply | Qty: 30 | Fill #1

## 2020-07-27 MED FILL — LISINOPRIL 30 MG TABS: 30 | 30 days supply | Qty: 30 | Fill #1

## 2020-10-17 NOTE — Progress Notes (Addendum)
Subjective:    Patient ID: Jacob Schneider, male    DOB: [birth year], 45 y.o.   MRN: [id number] Virtual Visit via Telephone Note  I connected with Jacob Schneider on 10/20/20 at  4:00 PM EDT by telephone and verified that I am speaking with the correct person using two identifiers.   Consent:  I discussed the limitations, risks, security and privacy concerns of performing an evaluation and management service by telephone and the availability of in person appointments. I also discussed with the patient that there may be a patient responsible charge related to this service. The patient expressed understanding and agreed to proceed.  Location of patient:I was in my office  Location of provider:pt was at home  Persons participating in the televisit with the patient.   Spanish interpeter assisted in the call Jacob Schneider    History of Present Illness: 45 y.o.M Latino the patient is accompanied by his friend who serves also as an interpreter  Patient comes today as a post hospital follow-up and to establish care.  Patient has a history of hypertension diagnosed for 9 years but was unaware he had diabetes.  Patient also has history of alcoholic gastritis.  He has been out of his medications for hypertension for some time during the pandemic.  He previously had been followed for primary care but lost contact and during the pandemic.  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  He was found to have diabetes out-of-control and was discharged on NPH twice daily Metformin twice daily and Amaryl daily.  Patient returns today in follow-up.  He is off his blood pressure medicines.  Blood pressure is ranging slightly elevated at this visit.  He states his blood sugars at home of been in the 1 72-80 range.  He states he has been compliant with  the NPH.  Note during this hospitalization he was found to have fat in the liver and an enlarged appendix that would need further follow-up.  Patient has a prior history of excessive ethanol use and had history of alcohol delirium previously that has resolved this was associated with DKA as well as electrolyte disturbances.  That admission was in 2018.  Below is the discharge summary of the most recent admission   Admit date: 12/05/2019 Discharge date: 12/07/2019  Admitting Diagnosis: Subcapsular hematoma of kidney 2 rib fractures Diabetes with hyperglycemia  Discharge Diagnosis Fall 48 hrs prior to admit Left 10-11 rib fxs RTP/ L perinephric hematoma DM  Consultants None  Procedures None  Hospital Course:  Jacob Schneider is a 45yo male h/o HTN and DM off his medications for several months, who presented [facility]3 after he was working at a house when he missed his footing and fell down the stairs. He fell on his left side. He did not lose consciousness. He has had worsening pain on the left side over the last 2 days so came into the hospital. He denies dizziness. Pain is worse with deep breathing.  Workup showed Subcapsular hematoma of kidney and 2 rib fractures.  Patient was admitted to the trauma service for pain control and observation. H/H monitored and remained stable.  Patient worked with therapies during this admission. On 6/5 the patient was tolerating diet, ambulating well, pain well controlled, vital signs stable and felt stable for  discharge home.  Patient will follow up as below and knows to call with questions or concerns.  We will send 1 month supply of his diabetes medications, and he knows to follow up with PCP to discuss this and his hypertension.    Note this patient comes in with pictures of his old prescriptions showing he had been on atorvastatin 20 mg lisinopril 2.5 mg HCTZ 25 mg and gabapentin 100 mg in the past.  The patient states he does eat quite a bit of  carbohydrates for breakfast lunch and dinner.  He states he is no longer drinking alcohol  02/05/2020 This patient was seen in return follow-up for diabetes and hypertension.  He states at home his diabetes control is showing glucose of 1 50-1 60 he did have Trulicity added in July of this year.  He is on 75 mcg weekly of this along with the Lantus and Metformin.  Today he comes in with a hemoglobin A1c of 8.0 noted had been 12 in June so there is an improvement here.  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  He is currently trying to finish his application for his orange card for the surgery referral.  Patient states his diet still has heavy carbohydrate intake.  Note he is willing to get the Covid vaccine and we told him where he can go for this vaccination.  Patient currently is not drinking any alcohol. 06/18/2020 Patient seen return follow-up doing quite well on arrival A1c is 6.1 blood sugar 120 he does complain of some itching in the back he has been 7 months without alcohol he is no longer drinking no longer smoking as well he is on an exercise program.  He has no specific complaints other than a rash on his back and upper arms. He is here to get his flu vaccine is yet to receive the Covid vaccine series  4/18 PT seen in return visit doing well.  No new complaintes.  The patient's been compliant with his blood pressure medications.  Blood sugars have been running well with no change in Trulicity and metformin and he is on an improved diet.  Patient has no other complaints at this time.     Past Medical History:  Diagnosis Date  . Alcohol withdrawal delirium, acute, hyperactive ([place]) 03/30/2015  . Anxiety   . Chronic lower back pain   . Diabetes mellitus type II, uncontrolled ([place])    "I think it's type 1" (07/31/2017)  . DKA (diabetic ketoacidoses) 07/30/2017  . History of  stomach ulcers   . Hypertension   . 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 Renal hematoma 12/05/2019     Family History  Problem Relation Age of Onset  . Diabetes Father      Social History   Socioeconomic History  . Marital status: Single    Spouse name: Not on file  . Number of children: Not on file  . Years of education: Not on file  . Highest education level: Not on file  Occupational History  . Not on file  Tobacco Use  . Smoking status: Former Smoker    Packs/day: 0.10    Years: 28.00    Pack years: 2.80    Types: Cigarettes  . Smokeless tobacco: Never Used  . Tobacco comment: 07/31/2017 "stopped before Thanksgiving"  Vaping Use  . Vaping Use: Never used  Substance and  Sexual Activity  . Alcohol use: Yes    Alcohol/week: 28.0 standard drinks    Types: 28 Cans of beer per week    Comment: 07/31/2017 "4 beers/day on average"  . Drug use: Not Currently    Types: Cocaine    Comment: 07/31/2017 "none since ~ 05/2017"  . Sexual activity: Yes    Birth control/protection: None  Other Topics Concern  . Not on file  Social History Narrative  . Not on file   Social Determinants of Health   Financial Resource Strain: Not on file  Food Insecurity: Not on file  Transportation Needs: Not on file  Physical Activity: Not on file  Stress: Not on file  Social Connections: Not on file  Intimate Partner Violence: Not on file     No Known Allergies   Outpatient Medications Prior to Visit  Medication Sig Dispense Refill  . acetaminophen (TYLENOL) 500 MG tablet Take 2 tablets (1,000 mg total) by mouth every 6 (six) hours as needed for mild pain. 30 tablet 0  . atorvastatin (LIPITOR) 20 MG tablet TAKE 1 TABLET (20 MG TOTAL) BY MOUTH DAILY. 90 tablet 3  . blood glucose meter kit and supplies KIT Dispense based on patient and insurance preference. Daily in AM 1 each 0  . gabapentin (NEURONTIN) 100 MG capsule TAKE 1 CAPSULE (100 MG TOTAL) BY MOUTH 3 (THREE) TIMES DAILY. 90 capsule 3   . Insulin Pen Needle (TRUEPLUS 5-BEVEL PEN NEEDLES) 32G X 4 MM MISC Use as instructed to inject Lantus once daily. 100 each 2  . lisinopril (ZESTRIL) 30 MG tablet TAKE 1 TABLET (30 MG TOTAL) BY MOUTH DAILY. 60 tablet 3  . metFORMIN (GLUCOPHAGE) 1000 MG tablet TAKE 1 TABLET (1,000 MG TOTAL) BY MOUTH 2 (TWO) TIMES DAILY WITH A MEAL. 60 tablet 4  . Dulaglutide (TRULICITY) 8.34 HD/6.2IW SOPN Inject 0.75 mg into the skin once a week. 2 mL 0  . insulin glargine (LANTUS SOLOSTAR) 100 UNIT/ML Solostar Pen Inject 20 Units into the skin daily. 6 mL 2  . triamcinolone (KENALOG) 0.1 % APPLY 1 APPLICATION TOPICALLY 2 (TWO) TIMES DAILY. 30 g 0   No facility-administered medications prior to visit.     Review of Systems  Constitutional: Negative.   HENT: Negative.   Eyes: Negative.   Respiratory: Negative.   Cardiovascular: Negative.   Gastrointestinal: Negative.   Endocrine: Negative.   Genitourinary: Negative.   Musculoskeletal: Negative.   Allergic/Immunologic: Negative.   Neurological: Negative.   Hematological: Negative.   Psychiatric/Behavioral: Negative.        Objective:   Physical Exam There were no vitals filed for this visit. GAD 7 : Generalized Anxiety Score 06/18/2020 02/05/2020  Nervous, Anxious, on Edge 0 0  Control/stop worrying 0 0  Worry too much - different things 0 0  Trouble relaxing 0 0  Restless 0 0  Easily annoyed or irritable 0 0  Afraid - awful might happen 0 0  Total GAD 7 Score 0 0    Note current GAD-7 and PHQ-9 are low PHQ9 SCORE ONLY 06/18/2020 02/05/2020 01/01/2020  PHQ-9 Total Score 0 0 0   No exam this is a telephone visit   imaging:  Imaging Results (Last 48 hours) DG Chest 1 View  Result Date: 12/05/2019 CLINICAL DATA:  Fall. EXAM: CHEST  1 VIEW COMPARISON:  Chest x-ray dated Dec 01, 2017. FINDINGS: The heart size and mediastinal contours are within normal limits. Both lungs are clear. The visualized skeletal structures are unremarkable.  IMPRESSION: No active disease. Electronically Signed   By: Titus Dubin M.D.   On: 12/05/2019 14:03   DG Pelvis 1-2 Views  Result Date: 12/05/2019 CLINICAL DATA:  Fall. EXAM: PELVIS - 1-2 VIEW COMPARISON:  CT a chest, abdomen, and pelvis dated July 31, 2017. FINDINGS: There is no evidence of pelvic fracture or diastasis. No pelvic bone lesions are seen. Chronic soft tissue calcification lateral to the right acetabulum. IMPRESSION: No acute osseous abnormality. Electronically Signed   By: Titus Dubin M.D.   On: 12/05/2019 14:00   DG Elbow Complete Left  Result Date: 12/05/2019 CLINICAL DATA:  Fall. EXAM: LEFT ELBOW - COMPLETE 3+ VIEW COMPARISON:  None. FINDINGS: There is no evidence of fracture, dislocation, or joint effusion. There is no evidence of arthropathy or other focal bone abnormality. Triceps enthesophyte. Soft tissues are unremarkable. IMPRESSION: Negative. Electronically Signed   By: Titus Dubin M.D.   On: 12/05/2019 14:03   CT Chest W Contrast  Result Date: 12/05/2019 CLINICAL DATA:  Abdominal trauma, fall from ladder Tuesday EXAM: CT CHEST, ABDOMEN, AND PELVIS WITH CONTRAST TECHNIQUE: Multidetector CT imaging of the chest, abdomen and pelvis was performed following the standard protocol during bolus administration of intravenous contrast. CONTRAST:  135m OMNIPAQUE IOHEXOL 300 MG/ML  SOLN COMPARISON:  CT angiogram chest abdomen pelvis, 07/30/2017 FINDINGS: CT CHEST FINDINGS Cardiovascular: No significant vascular findings. Normal heart size. No pericardial effusion. Mediastinum/Nodes: No enlarged mediastinal, hilar, or axillary lymph nodes. Thyroid gland, trachea, and esophagus demonstrate no significant findings. Lungs/Pleura: Trace left pleural effusion associated atelectasis or consolidation. Musculoskeletal: No chest wall mass. Minimally displaced fractures of the posterior left tenth and eleventh ribs (series 3, image 53). CT ABDOMEN PELVIS FINDINGS Hepatobiliary:  Hepatomegaly, maximum coronal span 22 cm. Hepatic steatosis. No gallstones, gallbladder wall thickening, or biliary dilatation. Pancreas: Unremarkable. No pancreatic ductal dilatation or surrounding inflammatory changes. Spleen: Normal in size without significant abnormality. Adrenals/Urinary Tract: Adrenal glands are unremarkable. There is a subcapsular hematoma about the left kidney, measuring approximately 3.1 cm in thickness (series 3, image 64). The right kidney is normal. No hydronephrosis. Bladder is unremarkable. Stomach/Bowel: Stomach is within normal limits. The appendix is fluid-filled, measuring up to 1.2 cm in caliber (series 3, image 93). There is a fluid-filled lesion at the right cecal base adjacent to the appendiceal os measuring 2.8 x 2.3 cm (series 3, image 91). No evidence of bowel wall thickening, distention, or inflammatory changes. Vascular/Lymphatic: No significant vascular findings are present. No enlarged abdominal or pelvic lymph nodes. Reproductive: No mass or other abnormality. Other: No abdominal wall hernia or abnormality. No abdominopelvic ascites. Small left retroperitoneal hematoma measuring up to 1.3 cm in thickness (series 3, image 93). Musculoskeletal: No acute or significant osseous findings. IMPRESSION: 1. Minimally displaced fractures of the posterior left tenth and eleventh ribs. Trace left pleural effusion with associated atelectasis or consolidation. 2. There is a subcapsular hematoma about the left kidney, measuring approximately 3.1 cm in thickness. 3. Small adjacent left retroperitoneal hematoma measuring up to 1.3 cm in thickness. 4. The appendix is fluid-filled, measuring up to 1.2 cm in caliber. There is a fluid-filled lesion at the right cecal base adjacent to the appendiceal os measuring 2.8 x 2.3 cm. 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 Recommend nonemergent surgical consultation due to  malignant potential. 5. Hepatomegaly and hepatic steatosis. These results were called by telephone at the time of interpretation on 12/05/2019 at 4:46 pm to [place] , who verbally acknowledged these results. Electronically Signed   By: Eddie Candle M.D.   On: 12/05/2019 16:50   CT ABDOMEN PELVIS W CONTRAST  Result Date: 12/05/2019 CLINICAL DATA:  Abdominal trauma, fall from ladder Tuesday EXAM: CT CHEST, ABDOMEN, AND PELVIS WITH CONTRAST TECHNIQUE: Multidetector CT imaging of the chest, abdomen and pelvis was performed following the standard protocol during bolus administration of intravenous contrast. CONTRAST:  17m OMNIPAQUE IOHEXOL 300 MG/ML  SOLN COMPARISON:  CT angiogram chest abdomen pelvis, 07/30/2017 FINDINGS: CT CHEST FINDINGS Cardiovascular: No significant vascular findings. Normal heart size. No pericardial effusion. Mediastinum/Nodes: No enlarged mediastinal, hilar, or axillary lymph nodes. Thyroid gland, trachea, and esophagus demonstrate no significant findings. Lungs/Pleura: Trace left pleural effusion associated atelectasis or consolidation. Musculoskeletal: No chest wall mass. Minimally displaced fractures of the posterior left tenth and eleventh ribs (series 3, image 53). CT ABDOMEN PELVIS FINDINGS Hepatobiliary: Hepatomegaly, maximum coronal span 22 cm. Hepatic steatosis. No gallstones, gallbladder wall thickening, or biliary dilatation. Pancreas: Unremarkable. No pancreatic ductal dilatation or surrounding inflammatory changes. Spleen: Normal in size without significant abnormality. Adrenals/Urinary Tract: Adrenal glands are unremarkable. There is a subcapsular hematoma about the left kidney, measuring approximately 3.1 cm in thickness (series 3, image 64). The right kidney is normal. No hydronephrosis. Bladder is unremarkable. Stomach/Bowel: Stomach is within normal limits. The appendix is fluid-filled, measuring up to 1.2 cm in caliber (series 3, image 93). There is a fluid-filled  lesion at the right cecal base adjacent to the appendiceal os measuring 2.8 x 2.3 cm (series 3, image 91). No evidence of bowel wall thickening, distention, or inflammatory changes. Vascular/Lymphatic: No significant vascular findings are present. No enlarged abdominal or pelvic lymph nodes. Reproductive: No mass or other abnormality. Other: No abdominal wall hernia or abnormality. No abdominopelvic ascites. Small left retroperitoneal hematoma measuring up to 1.3 cm in thickness (series 3, image 93). Musculoskeletal: No acute or significant osseous findings. IMPRESSION: 1. Minimally displaced fractures of the posterior left tenth and eleventh ribs. Trace left pleural effusion with associated atelectasis or consolidation. 2. There is a subcapsular hematoma about the left kidney, measuring approximately 3.1 cm in thickness. 3. Small adjacent left retroperitoneal hematoma measuring up to 1.3 cm in thickness. 4. The appendix is fluid-filled, measuring up to 1.2 cm in caliber. There is a fluid-filled lesion at the right cecal base adjacent to the appendiceal os measuring 2.8 x 2.3 cm. 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 Recommend nonemergent surgical consultation due to malignant potential. 5. Hepatomegaly and hepatic steatosis. These results were called by telephone at the time of interpretation on 12/05/2019 at 4:46 pm to [place], who verbally acknowledged these results. Electronically Signed   By: AEddie CandleM.D.   On: 12/05/2019 16:50   DG Knee Complete 4 Views Left  Result Date: 12/05/2019 CLINICAL DATA:  Fall. EXAM: LEFT KNEE - COMPLETE 4+ VIEW COMPARISON:  None. FINDINGS: No evidence of fracture, dislocation, or joint effusion. No evidence of arthropathy or other focal bone abnormality. Soft tissues are unremarkable. IMPRESSION: Negative. Electronically Signed   By: WTitus DubinM.D.   On: 12/05/2019 14:01   DG Knee  Complete 4 Views Right  Result Date: 12/05/2019 CLINICAL DATA:  Fall. EXAM: RIGHT KNEE - COMPLETE 4+ VIEW COMPARISON:  None. FINDINGS: No evidence of fracture, dislocation, or joint effusion. No evidence of arthropathy or other focal bone abnormality. Soft tissues are unremarkable. IMPRESSION: Negative. Electronically Signed  By: Titus Dubin M.D.   On: 12/05/2019 14:00       Assessment & Plan:  I personally reviewed all images and lab data in the [facility] system as well as any outside material available during this office visit and agree with the  radiology impressions.   Essential hypertension Not clear of how the blood pressure control is at this visit we will bring the patient in for direct exam  Controlled type 2 diabetes mellitus ([place]) Diabetes under good control according to the patient his glucoses are anywhere from 120-140 his A1c was excellent at the last visit we will continue current medications without change  Hyperlipidemia Continue current program   Diagnoses and all orders for this visit:  DM (diabetes mellitus) type 2, uncontrolled, with ketoacidosis ([place]) -     Dulaglutide (TRULICITY) 1.03 UD/3.1YH SOPN; Inject 0.75 mg into the skin once a week. -     insulin glargine (LANTUS SOLOSTAR) 100 UNIT/ML Solostar Pen; Inject 20 Units into the skin daily.  Essential hypertension  Controlled type 2 diabetes mellitus with diabetic polyneuropathy, without long-term current use of insulin ([facility])  Pure hypercholesterolemia     I spent 20 minutes on this reviewing the patient's records non-face-to-face time encountering the patient with a 15-minute phone interview using the Spanish interpreter  Follow Up Instructions: Patient knows to follow-up face-to-face visit will occur within the next 2 months   I discussed the assessment and treatment plan with the patient. The patient was provided an opportunity to ask questions and all were answered. The patient agreed with the plan  and demonstrated an understanding of the instructions.   The patient was advised to call back or seek an in-person evaluation if the symptoms worsen or if the condition fails to improve as anticipated.  I provided 20 minutes of non-face-to-face time during this encounter  including  median intraservice time , review of notes, labs, imaging, medications  and explaining diagnosis and management to the patient .    Asencion Noble, MD

## 2020-10-19 ENCOUNTER — Encounter: Payer: Self-pay | Admitting: Critical Care Medicine

## 2020-10-19 ENCOUNTER — Ambulatory Visit: Payer: Self-pay | Attending: Critical Care Medicine | Admitting: Critical Care Medicine

## 2020-10-19 ENCOUNTER — Other Ambulatory Visit: Payer: Self-pay

## 2020-10-19 DIAGNOSIS — E7800 Pure hypercholesterolemia, unspecified: Secondary | ICD-10-CM

## 2020-10-19 DIAGNOSIS — E1142 Type 2 diabetes mellitus with diabetic polyneuropathy: Secondary | ICD-10-CM

## 2020-10-19 DIAGNOSIS — I10 Essential (primary) hypertension: Secondary | ICD-10-CM

## 2020-10-19 DIAGNOSIS — E1110 Type 2 diabetes mellitus with ketoacidosis without coma: Secondary | ICD-10-CM

## 2020-10-19 MED ORDER — LANTUS SOLOSTAR 100 UNIT/ML ~~LOC~~ SOPN
20.0000 [IU] | PEN_INJECTOR | Freq: Every day | SUBCUTANEOUS | 2 refills | Status: DC
  Filled 2020-10-19: qty 6, 30d supply, fill #0

## 2020-10-19 MED ORDER — TRULICITY 0.75 MG/0.5ML ~~LOC~~ SOAJ
0.7500 mg | SUBCUTANEOUS | 3 refills | Status: DC
  Filled 2020-10-19: qty 2, 28d supply, fill #0

## 2020-10-19 NOTE — Patient Instructions (Addendum)
COVID-19 Vaccine Information can be found at: [email or web address] For questions related to vaccine distribution or appointments, please email [email or web address] or call [phone].    

## 2020-10-19 NOTE — Assessment & Plan Note (Signed)
Continue current program 

## 2020-10-19 NOTE — Assessment & Plan Note (Signed)
Not clear of how the blood pressure control is at this visit we will bring the patient in for direct exam

## 2020-10-19 NOTE — Assessment & Plan Note (Signed)
Diabetes under good control according to the patient his glucoses are anywhere from 120-140 his A1c was excellent at the last visit we will continue current medications without change

## 2020-10-21 ENCOUNTER — Other Ambulatory Visit: Payer: Self-pay

## 2020-10-22 ENCOUNTER — Other Ambulatory Visit: Payer: Self-pay | Admitting: Pharmacist

## 2020-10-22 ENCOUNTER — Encounter (INDEPENDENT_AMBULATORY_CARE_PROVIDER_SITE_OTHER): Payer: Self-pay

## 2020-10-22 ENCOUNTER — Telehealth: Payer: Self-pay | Admitting: Critical Care Medicine

## 2020-10-22 ENCOUNTER — Other Ambulatory Visit: Payer: Self-pay

## 2020-10-22 MED ORDER — TRUEPLUS LANCETS 28G MISC
2 refills | Status: AC
  Filled 2020-10-22: qty 100, 33d supply, fill #0

## 2020-10-22 MED FILL — Atorvastatin Calcium Tab 20 MG (Base Equivalent): ORAL | 30 days supply | Qty: 30 | Fill #0 | Status: AC

## 2020-10-22 MED FILL — Gabapentin Cap 100 MG: ORAL | 30 days supply | Qty: 90 | Fill #0 | Status: AC

## 2020-10-22 MED FILL — Metformin HCl Tab 1000 MG: ORAL | 30 days supply | Qty: 60 | Fill #0 | Status: AC

## 2020-10-22 MED FILL — Lisinopril Tab 30 MG: ORAL | 30 days supply | Qty: 30 | Fill #0 | Status: AC

## 2020-10-23 ENCOUNTER — Other Ambulatory Visit: Payer: Self-pay

## 2020-10-30 ENCOUNTER — Other Ambulatory Visit: Payer: Self-pay

## 2020-11-30 NOTE — Progress Notes (Signed)
Established Patient Office Visit  Subjective:  Patient ID: Jacob Schneider, male    DOB: 04-05-1976  Age: 45 y.o. MRN: [id number]  CC:  Chief Complaint  Patient presents with  . Follow-up  . Generalized Body Aches    HPI Jacob Schneider presents for T2DM and covid like illness  01/01/20 89 y.o.M Latino the patient is accompanied by his friend who serves also as an interpreter  Patient comes today as a post hospital follow-up and to establish care.  Patient has a history of hypertension diagnosed for 9 years but was unaware he had diabetes.  Patient also has history of alcoholic gastritis.  He has been out of his medications for hypertension for some time during the pandemic.  He previously had been followed for primary care but lost contact and during the pandemic.  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  He was found to have diabetes out-of-control and was discharged on NPH twice daily Metformin twice daily and Amaryl daily.  Patient returns today in follow-up.  He is off his blood pressure medicines.  Blood pressure is ranging slightly elevated at this visit.  He states his blood sugars at home of been in the 1 72-80 range.  He states he has been compliant with the NPH.  Note during this hospitalization he was found to have fat in the liver and an enlarged appendix that would need further follow-up.  Patient has a prior history of excessive ethanol use and had history of alcohol delirium previously that has resolved this was associated with DKA as well as electrolyte disturbances.  That admission was in 2018.  Below is the discharge summary of the most recent admission   Admit date: 12/05/2019 Discharge date: 12/07/2019  Admitting Diagnosis: Subcapsular hematoma of kidney 2 rib fractures Diabetes with  hyperglycemia  Discharge Diagnosis Fall 48 hrs prior to admit Left 10-11 rib fxs RTP/ L perinephric hematoma DM  Consultants None  Procedures None  Hospital Course:  Jacob Schneider is a 45yo male h/o HTN and DM off his medications for several months, who presented to [facility]3 after he was working at a house when he missed his footing and fell down the stairs. He fell on his left side. He did not lose consciousness. He has had worsening pain on the left side over the last 2 days so came into the hospital. He denies dizziness. Pain is worse with deep breathing.  Workup showed Subcapsular hematoma of kidney and 2 rib fractures.  Patient was admitted to the trauma service for pain control and observation. H/H monitored and remained stable.  Patient worked with therapies during this admission. On 6/5 the patient was tolerating diet, ambulating well, pain well controlled, vital signs stable and felt stable for discharge home.  Patient will follow up as below and knows to call with questions or concerns.  We will send 1 month supply of his diabetes medications, and he knows to follow up with PCP to discuss this and his hypertension.    Note this patient comes in with pictures of his old prescriptions showing he had been on atorvastatin 20 mg lisinopril 2.5 mg HCTZ 25 mg and gabapentin 100 mg in the past.  The patient states he does eat quite a bit of carbohydrates for breakfast lunch and  dinner.  He states he is no longer drinking alcohol  02/05/2020 This patient was seen in return follow-up for diabetes and hypertension.  He states at home his diabetes control is showing glucose of 1 50-1 60 he did have Trulicity added in July of this year.  He is on 75 mcg weekly of this along with the Lantus and Metformin.  Today he comes in with a hemoglobin A1c of 8.0 noted had been 12 in June so there is an improvement here.  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  He is currently trying to finish his application for his orange card for the surgery referral.  Patient states his diet still has heavy carbohydrate intake.  Note he is willing to get the Covid vaccine and we told him where he can go for this vaccination.  Patient currently is not drinking any alcohol. 06/18/2020 Patient seen return follow-up doing quite well on arrival A1c is 6.1 blood sugar 120 he does complain of some itching in the back he has been 7 months without alcohol he is no longer drinking no longer smoking as well he is on an exercise program.  He has no specific complaints other than a rash on his back and upper arms. He is here to get his flu vaccine is yet to receive the Covid vaccine series  4/18 PT seen in return visit doing well.  No new complaintes.  The patient's been compliant with his blood pressure medications.  Blood sugars have been running well with no change in Trulicity and metformin and he is on an improved diet.  Patient has no other complaints at this time.  12/01/2020 this visit was accomplished with Spanish interpreter Jacob Schneider [phone] video interpreter Note this visit occurred with full PPE in place due to the fact that this patient screening symptoms were compatible with COVID and he is unvaccinated  This patient comes to the office today for follow-up of hypertension and diabetes but on arrival states he has had body aches severe fatigue headaches and a dry cough for 2 days.  Note he is unvaccinated for COVID.  He denies any stomach issues or significant respiratory distress.  His temperature is elevated 99 degrees on arrival.  He is unclear if he has had any exposure to COVID.  His diabetes and blood pressure doing well on arrival blood pressure was 106/75 blood sugar was 115 A1c was 6.1  Patient has been compliant with all of his diabetic medications and blood pressure  medicine and does need refills    Past Medical History:  Diagnosis Date  . Alcohol withdrawal delirium, acute, hyperactive ([place]) 03/30/2015  . Anxiety   . Chronic lower back pain   . Diabetes mellitus type II, uncontrolled ([place])    "I think it's type 1" (07/31/2017)  . DKA (diabetic ketoacidoses) 07/30/2017  . History of stomach ulcers   . Hypertension   . 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 Renal hematoma 12/05/2019    Past Surgical History:  Procedure Laterality Date  . BACK SURGERY    . [place] SURGERY  2004    Family History  Problem Relation Age of Onset  . Diabetes Father     Social History   Socioeconomic History  . Marital status: Single    Spouse name: Not on file  . Number of children: Not on file  . Years  of education: Not on file  . Highest education level: Not on file  Occupational History  . Not on file  Tobacco Use  . Smoking status: Former Smoker    Packs/day: 0.10    Years: 28.00    Pack years: 2.80    Types: Cigarettes  . Smokeless tobacco: Never Used  . Tobacco comment: 07/31/2017 "stopped before Thanksgiving"  Vaping Use  . Vaping Use: Never used  Substance and Sexual Activity  . Alcohol use: Yes    Alcohol/week: 28.0 standard drinks    Types: 28 Cans of beer per week    Comment: 07/31/2017 "4 beers/day on average"  . Drug use: Not Currently    Types: Cocaine    Comment: 07/31/2017 "none since ~ 05/2017"  . Sexual activity: Yes    Birth control/protection: None  Other Topics Concern  . Not on file  Social History Narrative  . Not on file   Social Determinants of Health   Financial Resource Strain: Not on file  Food Insecurity: Not on file  Transportation Needs: Not on file  Physical Activity: Not on file  Stress: Not on file  Social Connections: Not on file  Intimate Partner Violence: Not on file    Outpatient Medications Prior to Visit  Medication Sig Dispense Refill  . acetaminophen (TYLENOL) 500 MG tablet Take 2 tablets  (1,000 mg total) by mouth every 6 (six) hours as needed for mild pain. 30 tablet 0  . gabapentin (NEURONTIN) 100 MG capsule TAKE 1 CAPSULE (100 MG TOTAL) BY MOUTH 3 (THREE) TIMES DAILY. 90 capsule 3  . insulin glargine (LANTUS SOLOSTAR) 100 UNIT/ML Solostar Pen Inject 20 Units into the skin daily. 6 mL 2  . atorvastatin (LIPITOR) 20 MG tablet TAKE 1 TABLET (20 MG TOTAL) BY MOUTH DAILY. (Patient not taking: Reported on 12/01/2020) 90 tablet 3  . blood glucose meter kit and supplies KIT Dispense based on patient and insurance preference. Daily in AM 1 each 0  . Dulaglutide (TRULICITY) 7.00 FV/4.9SW SOPN Inject 0.75 mg into the skin once a week. 2 mL 3  . Insulin Pen Needle (TRUEPLUS 5-BEVEL PEN NEEDLES) 32G X 4 MM MISC Use as instructed to inject Lantus once daily. 100 each 2  . lisinopril (ZESTRIL) 30 MG tablet TAKE 1 TABLET (30 MG TOTAL) BY MOUTH DAILY. (Patient not taking: Reported on 12/01/2020) 60 tablet 3  . metFORMIN (GLUCOPHAGE) 1000 MG tablet TAKE 1 TABLET (1,000 MG TOTAL) BY MOUTH 2 (TWO) TIMES DAILY WITH A MEAL. 60 tablet 4  . TRUEplus Lancets 28G MISC Use to check blood sugar three times daily. 100 each 2   No facility-administered medications prior to visit.    No Known Allergies  ROS Review of Systems  Constitutional: Positive for fatigue and fever.  Respiratory: Positive for cough. Negative for shortness of breath, wheezing and stridor.   Cardiovascular: Negative.   Gastrointestinal: Negative.   Genitourinary: Negative.   Musculoskeletal: Positive for arthralgias and myalgias.  Neurological: Positive for dizziness and headaches.  Psychiatric/Behavioral: Negative.       Objective:    Physical Exam Vitals:   12/01/20 0940  BP: 106/75  Pulse: 95  Resp: 16  Temp: 99.1 F (37.3 C)  TempSrc: Oral  SpO2: 97%  Weight: 203 lb (92.1 kg)    Gen: Pleasant, well-nourished, in no distress,  normal affect  ENT: No lesions,  mouth clear,  oropharynx clear, no postnasal  drip  Neck: No JVD, no TMG, no carotid bruits  Lungs:  No use of accessory muscles, no dullness to percussion, clear without rales or rhonchi  Cardiovascular: RRR, heart sounds normal, no murmur or gallops, no peripheral edema  Abdomen: soft and NT, no HSM,  BS normal  Musculoskeletal: No deformities, no cyanosis or clubbing  Neuro: alert, non focal  Skin: Warm, no lesions or rashes  No results found.  BP 106/75 (BP Location: Left Arm, Patient Position: Sitting, Cuff Size: Large)   Pulse 95   Temp 99.1 F (37.3 C) (Oral)   Resp 16   Wt 203 lb (92.1 kg)   SpO2 97%   BMI 33.78 kg/m  Wt Readings from Last 3 Encounters:  12/01/20 203 lb (92.1 kg)  06/18/20 193 lb 12.8 oz (87.9 kg)  02/05/20 190 lb (86.2 kg)     Health Maintenance Due  Topic Date Due  . COVID-19 Vaccine (1) Never done  . OPHTHALMOLOGY EXAM  Never done  . COLON CANCER SCREENING ANNUAL FOBT  Never done    There are no preventive care reminders to display for this patient.  No results found for: TSH Lab Results  Component Value Date   WBC 8.3 01/01/2020   HGB 13.0 01/01/2020   HCT 41.5 01/01/2020   MCV 82 01/01/2020   PLT 387 01/01/2020   Lab Results  Component Value Date   NA 140 01/01/2020   K 4.7 01/01/2020   CO2 23 01/01/2020   GLUCOSE 119 (H) 01/01/2020   BUN 15 01/01/2020   CREATININE 0.76 01/01/2020   BILITOT <0.2 01/01/2020   ALKPHOS 126 (H) 01/01/2020   AST 12 01/01/2020   ALT 11 01/01/2020   PROT 7.7 01/01/2020   ALBUMIN 4.6 01/01/2020   CALCIUM 9.9 01/01/2020   ANIONGAP 10 12/06/2019   Lab Results  Component Value Date   CHOL 141 08/21/2017   Lab Results  Component Value Date   HDL 33 (L) 08/21/2017   Lab Results  Component Value Date   LDLCALC 08-30-1975 08/21/2017   Lab Results  Component Value Date   TRIG 154 (H) 08/21/2017   Lab Results  Component Value Date   CHOLHDL 4.3 08/21/2017   Lab Results  Component Value Date   HGBA1C 6.1 (A) 06/18/2020       Assessment & Plan:   Problem List Items Addressed This Visit      Cardiovascular and Mediastinum   Essential hypertension    Hypertension well controlled continue lisinopril as prescribed        Endocrine   Controlled type 2 diabetes mellitus ([place])    Diabetes well controlled at this time  Will continue Lantus as prescribed also continue metformin and Trulicity weekly refills on Neurontin also sent for his diabetic neuropathy        Nervous and Auditory   Neuropathy    Neuropathy well-controlled continue gabapentin as prescribed        Musculoskeletal and Integument   Dermatitis    Has resolved with topical steroids        Other   Hyperlipidemia    Was at goal at the last check will continue atorvastatin as prescribed      Encounter for screening for COVID-19 - Primary    Symptom complex compatible with COVID illness  I have obtained a COVID test and asked the patient to isolate until result is available patient would be a candidate for oral antiviral if he indeed has COVID  Will call patient with results        Relevant Orders  Novel Coronavirus, NAA ([facility])    Other Visit Diagnoses    DM (diabetes mellitus) type 2, uncontrolled, with ketoacidosis ([place])       Relevant Orders   Glucose (CBG)   HgB A1c       Follow-up: 4 months but will see sooner via video visit if Covid positive  I spent 36 minutes examining the patient, conducting the inteview, reviewing records and performing moderate complex assessment   Asencion Noble, MD

## 2020-12-01 ENCOUNTER — Encounter: Payer: Self-pay | Admitting: Critical Care Medicine

## 2020-12-01 ENCOUNTER — Other Ambulatory Visit: Payer: Self-pay

## 2020-12-01 ENCOUNTER — Ambulatory Visit: Payer: Self-pay | Attending: Critical Care Medicine | Admitting: Critical Care Medicine

## 2020-12-01 VITALS — BP 106/75 | HR 95 | Temp 99.1°F | Resp 16 | Wt 203.0 lb

## 2020-12-01 DIAGNOSIS — Z1152 Encounter for screening for COVID-19: Secondary | ICD-10-CM | POA: Insufficient documentation

## 2020-12-01 DIAGNOSIS — E7800 Pure hypercholesterolemia, unspecified: Secondary | ICD-10-CM

## 2020-12-01 DIAGNOSIS — E1142 Type 2 diabetes mellitus with diabetic polyneuropathy: Secondary | ICD-10-CM

## 2020-12-01 DIAGNOSIS — I10 Essential (primary) hypertension: Secondary | ICD-10-CM

## 2020-12-01 DIAGNOSIS — L309 Dermatitis, unspecified: Secondary | ICD-10-CM

## 2020-12-01 DIAGNOSIS — E1110 Type 2 diabetes mellitus with ketoacidosis without coma: Secondary | ICD-10-CM

## 2020-12-01 DIAGNOSIS — G629 Polyneuropathy, unspecified: Secondary | ICD-10-CM

## 2020-12-01 LAB — POCT GLYCOSYLATED HEMOGLOBIN (HGB A1C): Hemoglobin A1C: 6.1 % — AB (ref 4.0–5.6)

## 2020-12-01 LAB — GLUCOSE, POCT (MANUAL RESULT ENTRY): POC Glucose: 115 mg/dl — AB (ref 70–99)

## 2020-12-01 NOTE — Patient Instructions (Signed)
A COVID test was obtained  We will call you with the result  You can pick up your refills after you have been cleared from the COVID   You send in one of your family members and they can pick up your refills for you if you wish  No change in medications excellent work on your blood pressure and diabetes  Please consider getting a eye exam we have given you a resource to consider to go to to get your eyes checked with your diabetes  Return to see Dr. Delford Field in 4 months for your diabetes and high blood pressure  If you are COVID-positive Dr. Delford Field will be giving you an oral antiviral medication and we will have a follow-up video visit with you within the next 2 weeks if you are COVID-positive  Se obtuvo una prueba de COVID  Te llamaremos con el resultado.  Puede recoger sus recargas despus de que haya sido liberado del COVID  Envas a uno de los miembros de tu familia y ellos pueden recoger tus recargas por ti si lo deseas.  Ningn cambio en los medicamentos excelente trabajo en su presin arterial y diabetes  Considere hacerse un examen de la vista. Le hemos brindado un recurso al que puede acudir para que le revisen la vista si tiene diabetes.  Vuelva a ver al Dr. Delford Field en 4 meses por su diabetes y presin arterial alta  Si tiene COVID positivo, el Dr. Delford Field le dar un medicamento antiviral oral y tendremos una videoconsulta de seguimiento con usted dentro de las prximas 2 semanas si tiene COVID positivo.

## 2020-12-01 NOTE — Assessment & Plan Note (Signed)
Symptom complex compatible with COVID illness  I have obtained a COVID test and asked the patient to isolate until result is available patient would be a candidate for oral antiviral if he indeed has COVID  Will call patient with results

## 2020-12-01 NOTE — Assessment & Plan Note (Signed)
Was at goal at the last check will continue atorvastatin as prescribed

## 2020-12-01 NOTE — Assessment & Plan Note (Signed)
Has resolved with topical steroids

## 2020-12-01 NOTE — Assessment & Plan Note (Signed)
Neuropathy well-controlled continue gabapentin as prescribed

## 2020-12-01 NOTE — Assessment & Plan Note (Signed)
Diabetes well controlled at this time  Will continue Lantus as prescribed also continue metformin and Trulicity weekly refills on Neurontin also sent for his diabetic neuropathy

## 2020-12-01 NOTE — Assessment & Plan Note (Signed)
Hypertension well controlled continue lisinopril as prescribed

## 2020-12-02 LAB — SARS-COV-2, NAA 2 DAY TAT

## 2020-12-02 LAB — NOVEL CORONAVIRUS, NAA: SARS-CoV-2, NAA: NOT DETECTED

## 2020-12-02 NOTE — Progress Notes (Signed)
Let pt know covid test is negative. He does not have covid

## 2020-12-21 ENCOUNTER — Ambulatory Visit: Payer: Self-pay | Admitting: Critical Care Medicine

## 2021-01-25 ENCOUNTER — Other Ambulatory Visit: Payer: Self-pay

## 2021-01-25 MED FILL — Metformin HCl Tab 1000 MG: ORAL | 30 days supply | Qty: 60 | Fill #1 | Status: AC

## 2021-01-25 MED FILL — Lisinopril Tab 30 MG: ORAL | 30 days supply | Qty: 30 | Fill #1 | Status: AC

## 2021-01-25 MED FILL — Atorvastatin Calcium Tab 20 MG (Base Equivalent): ORAL | 30 days supply | Qty: 30 | Fill #1 | Status: AC

## 2021-02-03 ENCOUNTER — Other Ambulatory Visit: Payer: Self-pay

## 2021-02-03 MED ORDER — LISINOPRIL 30 MG PO TABS: 30.0000 mg | ORAL_TABLET | Freq: Every day | ORAL | 2 refills | Status: DC

## 2021-03-02 ENCOUNTER — Ambulatory Visit: Payer: Self-pay | Attending: Critical Care Medicine | Admitting: Critical Care Medicine

## 2021-03-02 ENCOUNTER — Encounter: Payer: Self-pay | Admitting: Critical Care Medicine

## 2021-03-02 ENCOUNTER — Other Ambulatory Visit: Payer: Self-pay

## 2021-03-02 VITALS — BP 123/86 | HR 97

## 2021-03-02 DIAGNOSIS — E1169 Type 2 diabetes mellitus with other specified complication: Secondary | ICD-10-CM

## 2021-03-02 DIAGNOSIS — E1142 Type 2 diabetes mellitus with diabetic polyneuropathy: Secondary | ICD-10-CM

## 2021-03-02 DIAGNOSIS — H5462 Unqualified visual loss, left eye, normal vision right eye: Secondary | ICD-10-CM

## 2021-03-02 DIAGNOSIS — E1110 Type 2 diabetes mellitus with ketoacidosis without coma: Secondary | ICD-10-CM

## 2021-03-02 DIAGNOSIS — E7800 Pure hypercholesterolemia, unspecified: Secondary | ICD-10-CM

## 2021-03-02 DIAGNOSIS — G629 Polyneuropathy, unspecified: Secondary | ICD-10-CM

## 2021-03-02 DIAGNOSIS — Z1211 Encounter for screening for malignant neoplasm of colon: Secondary | ICD-10-CM

## 2021-03-02 DIAGNOSIS — B351 Tinea unguium: Secondary | ICD-10-CM

## 2021-03-02 DIAGNOSIS — I10 Essential (primary) hypertension: Secondary | ICD-10-CM

## 2021-03-02 MED ORDER — TRULICITY 0.75 MG/0.5ML ~~LOC~~ SOAJ
0.7500 mg | SUBCUTANEOUS | 3 refills | Status: AC
  Filled 2021-03-02: qty 2, 28d supply, fill #0

## 2021-03-02 MED ORDER — ATORVASTATIN CALCIUM 20 MG PO TABS
ORAL_TABLET | Freq: Every day | ORAL | 3 refills | Status: AC
  Filled 2021-03-02: qty 30, 30d supply, fill #0
  Filled 2021-06-07: qty 30, 30d supply, fill #1

## 2021-03-02 MED ORDER — GABAPENTIN 100 MG PO CAPS
ORAL_CAPSULE | Freq: Three times a day (TID) | ORAL | 3 refills | Status: AC
  Filled 2021-03-02: qty 90, 30d supply, fill #0
  Filled 2021-06-07: qty 90, 30d supply, fill #1

## 2021-03-02 MED ORDER — LANTUS SOLOSTAR 100 UNIT/ML ~~LOC~~ SOPN
20.0000 [IU] | PEN_INJECTOR | Freq: Every day | SUBCUTANEOUS | 2 refills | Status: AC
  Filled 2021-03-02: qty 6, 30d supply, fill #0

## 2021-03-02 MED ORDER — METFORMIN HCL 1000 MG PO TABS
ORAL_TABLET | Freq: Two times a day (BID) | ORAL | 4 refills | Status: AC
Start: 2021-03-02 — End: 2022-03-02
  Filled 2021-03-02: qty 60, 30d supply, fill #0
  Filled 2021-06-07: qty 60, 30d supply, fill #1

## 2021-03-02 MED ORDER — TECHLITE PEN NEEDLES 32G X 4 MM MISC
2 refills | Status: AC
Start: 2021-03-02 — End: ?
  Filled 2021-03-02: qty 100, 100d supply, fill #0

## 2021-03-02 MED ORDER — LISINOPRIL 30 MG PO TABS
30.0000 mg | ORAL_TABLET | Freq: Every day | ORAL | 2 refills | Status: AC
  Filled 2021-03-02: qty 60, 60d supply, fill #0
  Filled 2021-06-07: qty 30, 30d supply, fill #1

## 2021-03-02 MED ORDER — CICLOPIROX 8 % EX SOLN
Freq: Every day | CUTANEOUS | 0 refills | Status: AC
  Filled 2021-03-02: qty 6.6, 7d supply, fill #0

## 2021-03-02 NOTE — Patient Instructions (Signed)
No change in medications refills on all your medicines sent to our pharmacy  Begin [place] toenail paint applied to both toenails per instruction to reduce toenail fungus  Remember to moisturize your feet after a shower with a skin moisturizer or foot cream  Contact the [facility] per information sheet we gave you for an eye exam this is quite urgent  Labs today include all of your screening labs and also colon cancer screening and urine for protein  Flu vaccine was given  Return to see Dr. Delford Field in 4 months  Sin cambios en los reabastecimientos de medicamentos en todos sus medicamentos enviados a nuestra farmacia  Comience a Press photographer pintura para uas de los pies [place] en ambas uas de los pies segn las instrucciones para reducir los hongos en las uas de los pies.  Recuerda hidratar tus pies despus de la ducha con una crema hidratante para la piel o para pies.  Comunquese con [facility] segn la hoja de informacin que le dimos para un examen de la vista, esto es bastante urgente.  Los laboratorios de hoy incluyen todos sus laboratorios de [employer] y tambin deteccin de cncer de colon y [place] para protenas  Se administr la vacuna contra la influenza  Volver a ver al Dr. Delford Field en 4 meses

## 2021-03-02 NOTE — Progress Notes (Addendum)
Established Patient Office Visit  Subjective:  Patient ID: Jacob Schneider, male    DOB: 1975-08-02  Age: 45 y.o. MRN: [id number]  CC:  Need med refills. T2DM f/u, vision loss left eye x 3 weeks   HPI Jacob Schneider presents for T2DM and covid like illness  01/01/20 45 y.o.M Latino the patient is accompanied by his friend who serves also as an interpreter   Patient comes today as a post hospital follow-up and to establish care.  Patient has a history of hypertension diagnosed for 9 years but was unaware he had diabetes.  Patient also has history of alcoholic gastritis.  He has been out of his medications for hypertension for some time during the pandemic.  He previously had been followed for primary care but lost contact and during the pandemic.   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  He was found to have diabetes out-of-control and was discharged on NPH twice daily Metformin twice daily and Amaryl daily.  Patient returns today in follow-up.  He is off his blood pressure medicines.  Blood pressure is ranging slightly elevated at this visit.  He states his blood sugars at home of been in the 1 72-80 range.  He states he has been compliant with the NPH.  Note during this hospitalization he was found to have fat in the liver and an enlarged appendix that would need further follow-up.  Patient has a prior history of excessive ethanol use and had history of alcohol delirium previously that has resolved this was associated with DKA as well as electrolyte disturbances.  That admission was in 2018.  Below is the discharge summary of the most recent admission     Admit date: 12/05/2019 Discharge date: 12/07/2019   Admitting Diagnosis: Subcapsular hematoma of kidney 2 rib fractures Diabetes with hyperglycemia   Discharge  Diagnosis Fall 48 hrs prior to admit Left 10-11 rib fxs RTP/ L perinephric hematoma DM    Consultants None   Procedures None   Hospital Course:  Jacob Schneider is a 45yo male h/o HTN and DM off his medications for several months, who presented to [facility] 6/3 after he was working at a house when he missed his footing and fell down the stairs. He fell on his left side. He did not lose consciousness. He has had worsening pain on the left side over the last 2 days so came into the hospital. He denies dizziness. Pain is worse with deep breathing.  Workup showed Subcapsular hematoma of kidney and 2 rib fractures.  Patient was admitted to the trauma service for pain control and observation. H/H monitored and remained stable.  Patient worked with therapies during this admission. On 6/5 the patient was tolerating diet, ambulating well, pain well controlled, vital signs stable and felt stable for discharge home.  Patient will follow up as below and knows to call with questions or concerns.  We will send 1 month supply of his diabetes medications, and he knows to follow up with PCP to discuss this and his hypertension.      Note this patient comes in with pictures of his old prescriptions showing he had been on atorvastatin 20 mg lisinopril 2.5 mg HCTZ 25 mg and gabapentin 100 mg in the past.   The patient states he does  eat quite a bit of carbohydrates for breakfast lunch and dinner.  He states he is no longer drinking alcohol   02/05/2020 This patient was seen in return follow-up for diabetes and hypertension.  He states at home his diabetes control is showing glucose of 1 50-1 60 he did have Trulicity added in July of this year.  He is on 75 mcg weekly of this along with the Lantus and Metformin.  Today he comes in with a hemoglobin A1c of 8.0 noted had been 12 in June so there is an improvement here.   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  He is currently trying to finish his application for his orange card for the surgery referral.   Patient states his diet still has heavy carbohydrate intake.  Note he is willing to get the Covid vaccine and we told him where he can go for this vaccination.   Patient currently is not drinking any alcohol. 06/18/2020 Patient seen return follow-up doing quite well on arrival A1c is 6.1 blood sugar 120 he does complain of some itching in the back he has been 7 months without alcohol he is no longer drinking no longer smoking as well he is on an exercise program.  He has no specific complaints other than a rash on his back and upper arms. He is here to get his flu vaccine is yet to receive the Covid vaccine series   10/19/20 PT seen in return visit doing well.  No new complaintes.  The patient's been compliant with his blood pressure medications.  Blood sugars have been running well with no change in Trulicity and metformin and he is on an improved diet.  Patient has no other complaints at this time.   12/01/20  this visit was accomplished with Spanish interpreter Jacob Schneider [phone] video interpreter Note this visit occurred with full PPE in place due to the fact that this patient screening symptoms were compatible with COVID and he is unvaccinated  This patient comes to the office today for follow-up of hypertension and diabetes but on arrival states he has had body aches severe fatigue headaches and a dry cough for 2 days.  Note he is unvaccinated for COVID.  He denies any stomach issues or significant respiratory distress.  His temperature is elevated 99 degrees on arrival.  He is unclear if he has had any exposure to COVID.  His diabetes and blood pressure doing well on arrival blood pressure was 106/75 blood sugar was 115 A1c was 6.1  Patient has been compliant with all of his diabetic medications and blood pressure medicine and does need  refills  03/02/2021 Patient seen in return follow-up with type 2 diabetes last A1c 6.1 in May.  He ruled out with COVID during his May illnesses appear to be a viral syndrome he is now fully recovered from this.  Patient does note however the onset 3 weeks ago of a cloudiness forming in the left eye with decreased visual acuity.  He has no pain in the left eye and no redness.  Patient does need a colon screening for colon cancer.  He does need a flu vaccine and agrees to this today.  He needs a general eye exam in any case because of his diabetes.  He still has some numbness in the feet and is using the gabapentin.  He maintains Trulicity Lantus and  metformin for his diabetes and has noted his blood sugars have been improved.  There are no other complaints at this visit.  He does request refills on medications.  Blood pressure remains excellent on lisinopril coming in today to 123/86 At last OV? Of Covid but Covid test was negative   Past Medical History:  Diagnosis Date   Alcohol withdrawal delirium, acute, hyperactive ([place]) 03/30/2015   Anxiety    Chronic lower back pain    Diabetes mellitus type II, uncontrolled ([place])    "I think it's type 1" (07/31/2017)   DKA (diabetic ketoacidoses) 07/30/2017   History of stomach ulcers    Hypertension    Mucocele of appendix 02/05/2020   Renal hematoma 12/05/2019    Past Surgical History:  Procedure Laterality Date   [place] SURGERY  2004    Family History  Problem Relation Age of Onset   Diabetes Father     Social History   Socioeconomic History   Marital status: Single    Spouse name: Not on file   Number of children: Not on file   Years of education: Not on file   Highest education level: Not on file  Occupational History   Not on file  Tobacco Use   Smoking status: Former    Packs/day: 0.10    Years: 28.00    Pack years: 2.80    Types: Cigarettes   Smokeless tobacco: Never   Tobacco comments:    07/31/2017  "stopped before Thanksgiving"  Vaping Use   Vaping Use: Never used  Substance and Sexual Activity   Alcohol use: Not Currently   Drug use: Not Currently    Types: Cocaine    Comment: 07/31/2017 "none since ~ 05/2017"   Sexual activity: Yes    Birth control/protection: None  Other Topics Concern   Not on file  Social History Narrative   Not on file   Social Determinants of Health   Financial Resource Strain: Not on file  Food Insecurity: Not on file  Transportation Needs: Not on file  Physical Activity: Not on file  Stress: Not on file  Social Connections: Not on file  Intimate Partner Violence: Not on file    Outpatient Medications Prior to Visit  Medication Sig Dispense Refill   blood glucose meter kit and supplies KIT Dispense based on patient and insurance preference. Daily in AM 1 each 0   TRUEplus Lancets 28G MISC Use to check blood sugar three times daily. 100 each 2   atorvastatin (LIPITOR) 20 MG tablet TAKE 1 TABLET (20 MG TOTAL) BY MOUTH DAILY. 90 tablet 3   Dulaglutide (TRULICITY) 6.97 XY/8.0XK SOPN Inject 0.75 mg into the skin once a week. 2 mL 3   gabapentin (NEURONTIN) 100 MG capsule TAKE 1 CAPSULE (100 MG TOTAL) BY MOUTH 3 (THREE) TIMES DAILY. 90 capsule 3   insulin glargine (LANTUS SOLOSTAR) 100 UNIT/ML Solostar Pen Inject 20 Units into the skin daily. 6 mL 2   Insulin Pen Needle (TRUEPLUS 5-BEVEL PEN NEEDLES) 32G X 4 MM MISC Use as instructed to inject Lantus once daily. 100 each 2   lisinopril (ZESTRIL) 30 MG tablet Take 1 tablet (30 mg total) by mouth daily. 60 tablet 2   metFORMIN (GLUCOPHAGE) 1000 MG tablet TAKE 1 TABLET (1,000 MG TOTAL) BY MOUTH 2 (TWO) TIMES DAILY WITH A MEAL. 60 tablet 4   acetaminophen (TYLENOL) 500 MG tablet Take 2 tablets (1,000 mg total) by mouth every 6 (  six) hours as needed for mild pain. 30 tablet 0   No facility-administered medications prior to visit.    No Known Allergies  ROS Review of Systems  Constitutional:  Negative  for fatigue and fever.  Eyes:  Positive for visual disturbance.       Sudden onset Cloud in left eye  Respiratory:  Negative for cough, shortness of breath, wheezing and stridor.   Cardiovascular: Negative.   Gastrointestinal: Negative.   Genitourinary: Negative.   Musculoskeletal:  Negative for arthralgias and myalgias.  Neurological:  Negative for dizziness and headaches.  Psychiatric/Behavioral: Negative.       Objective:    Physical Exam Vitals:   03/02/21 1601  BP: 123/86  Pulse: 97  SpO2: 100%    Gen: Pleasant, well-nourished, in no distress,  normal affect  ENT: No lesions,  mouth clear,  oropharynx clear, no postnasal drip  Neck: No JVD, no TMG, no carotid bruits  Lungs: No use of accessory muscles, no dullness to percussion, clear without rales or rhonchi  Cardiovascular: RRR, heart sounds normal, no murmur or gallops, no peripheral edema  Abdomen: soft and NT, no HSM,  BS normal  Musculoskeletal: No deformities, no cyanosis or clubbing  Neuro: alert, non focal  Skin: Warm, no lesions or rashes  Footexam  NORMAL except toenail fungus.   BP 123/86   Pulse 97   SpO2 100%  Wt Readings from Last 3 Encounters:  12/01/20 203 lb (92.1 kg)  06/18/20 193 lb 12.8 oz (87.9 kg)  02/05/20 190 lb (86.2 kg)     Health Maintenance Due  Topic Date Due   OPHTHALMOLOGY EXAM  Never done   COLON CANCER SCREENING ANNUAL FOBT  Never done   INFLUENZA VACCINE  02/01/2021    There are no preventive care reminders to display for this patient.  No results found for: TSH Lab Results  Component Value Date   WBC 8.3 01/01/2020   HGB 13.0 01/01/2020   HCT 41.5 01/01/2020   MCV 82 01/01/2020   PLT 387 01/01/2020   Lab Results  Component Value Date   NA 140 01/01/2020   K 4.7 01/01/2020   CO2 23 01/01/2020   GLUCOSE 119 (H) 01/01/2020   BUN 15 01/01/2020   CREATININE 0.76 01/01/2020   BILITOT <0.2 01/01/2020   ALKPHOS 126 (H) 01/01/2020   AST 12 01/01/2020    ALT 11 01/01/2020   PROT 7.7 01/01/2020   ALBUMIN 4.6 01/01/2020   CALCIUM 9.9 01/01/2020   ANIONGAP 10 12/06/2019   Lab Results  Component Value Date   CHOL 141 08/21/2017   Lab Results  Component Value Date   HDL 33 (L) 08/21/2017   Lab Results  Component Value Date   LDLCALC 77 08/21/2017   Lab Results  Component Value Date   TRIG 154 (H) 08/21/2017   Lab Results  Component Value Date   CHOLHDL 4.3 08/21/2017   Lab Results  Component Value Date   HGBA1C 6.1 (A) 12/01/2020      Assessment & Plan:   Problem List Items Addressed This Visit       Cardiovascular and Mediastinum   Essential hypertension    Blood pressure well controlled continue lisinopril      Relevant Medications   atorvastatin (LIPITOR) 20 MG tablet   lisinopril (ZESTRIL) 30 MG tablet   Other Relevant Orders   CBC with Differential/Platelet     Endocrine   Controlled type 2 diabetes mellitus ([place]) - Primary  Type 2 diabetes now well controlled A1c down to 6.1 continue Lantus Trulicity and metformin follow-up urine microalbumin and lipid panel      Relevant Medications   atorvastatin (LIPITOR) 20 MG tablet   Dulaglutide (TRULICITY) 0.98 JX/9.1YN SOPN   insulin glargine (LANTUS SOLOSTAR) 100 UNIT/ML Solostar Pen   Insulin Pen Needle (TRUEPLUS 5-BEVEL PEN NEEDLES) 32G X 4 MM MISC   lisinopril (ZESTRIL) 30 MG tablet   metFORMIN (GLUCOPHAGE) 1000 MG tablet     Nervous and Auditory   Neuropathy    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 continue gabapentin        Musculoskeletal and Integument   Onychomycosis of multiple toenails with type 2 diabetes mellitus (HCC)    Bilateral toenail fungus with T2DM  Plan topical penlac       Relevant Medications   atorvastatin (LIPITOR) 20 MG tablet   Dulaglutide (TRULICITY) 8.29 FA/2.1HY SOPN   insulin glargine (LANTUS SOLOSTAR) 100 UNIT/ML Solostar Pen   lisinopril (ZESTRIL) 30 MG tablet   metFORMIN (GLUCOPHAGE) 1000 MG tablet    ciclopirox (PENLAC) 8 % solution     Other   Hyperlipidemia    Hyperlipidemia improved continue atorvastatin recheck liver function and lipid panel      Relevant Medications   atorvastatin (LIPITOR) 20 MG tablet   lisinopril (ZESTRIL) 30 MG tablet   Other Relevant Orders   Lipid panel   Decreased vision of left eye    Subacute onset cloudiness left unclear etiology I was not able to examine the eye properly  Referral to optometry was made at [facility] ASAP patient agrees he will attend      Other Visit Diagnoses     DM (diabetes mellitus) type 2, uncontrolled       Relevant Medications   atorvastatin (LIPITOR) 20 MG tablet   Dulaglutide (TRULICITY) 8.65 HQ/4.6NG SOPN   insulin glargine (LANTUS SOLOSTAR) 100 UNIT/ML Solostar Pen   Insulin Pen Needle (TRUEPLUS 5-BEVEL PEN NEEDLES) 32G X 4 MM MISC   lisinopril (ZESTRIL) 30 MG tablet   metFORMIN (GLUCOPHAGE) 1000 MG tablet   Other Relevant Orders   Microalbumin / creatinine urine ratio   Comprehensive metabolic panel   Hemoglobin A1c   DM (diabetes mellitus) type 2, uncontrolled, with ketoacidosis (HCC)       Relevant Medications   atorvastatin (LIPITOR) 20 MG tablet   Dulaglutide (TRULICITY) 2.95 MW/4.1LK SOPN   insulin glargine (LANTUS SOLOSTAR) 100 UNIT/ML Solostar Pen   Insulin Pen Needle (TRUEPLUS 5-BEVEL PEN NEEDLES) 32G X 4 MM MISC   lisinopril (ZESTRIL) 30 MG tablet   metFORMIN (GLUCOPHAGE) 1000 MG tablet   Other Relevant Orders   Microalbumin / creatinine urine ratio   Comprehensive metabolic panel   Hemoglobin A1c   Colon cancer screening       Relevant Orders   Fecal occult blood, imunochemical      Plan colon cancer screening with fecal occult Follow-up: 4 month f/u  Asencion Noble, MD

## 2021-03-02 NOTE — Assessment & Plan Note (Signed)
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

## 2021-03-02 NOTE — Assessment & Plan Note (Signed)
Type 2 diabetes now well controlled A1c down to 6.1 continue Lantus Trulicity and metformin follow-up urine microalbumin and lipid panel

## 2021-03-02 NOTE — Assessment & Plan Note (Signed)
Bilateral toenail fungus with T2DM  Plan topical penlac

## 2021-03-02 NOTE — Progress Notes (Signed)
Medication Refill-  Lisinopril Atorvastatin Pen needles for lantus  Barbara Cower [phone]

## 2021-03-02 NOTE — Assessment & Plan Note (Signed)
Subacute onset cloudiness left unclear etiology I was not able to examine the eye properly  Referral to optometry was made at [facility] ASAP patient agrees he will attend

## 2021-03-02 NOTE — Assessment & Plan Note (Signed)
Hyperlipidemia improved continue atorvastatin recheck liver function and lipid panel

## 2021-03-02 NOTE — Assessment & Plan Note (Signed)
Blood pressure well controlled continue lisinopril

## 2021-03-03 ENCOUNTER — Other Ambulatory Visit: Payer: Self-pay

## 2021-03-03 LAB — CBC WITH DIFFERENTIAL/PLATELET
Basophils Absolute: 0 10*3/uL (ref 0.0–0.2)
Basos: 1 %
EOS (ABSOLUTE): 0.2 10*3/uL (ref 0.0–0.4)
Eos: 3 %
Hematocrit: 42.9 % (ref 37.5–51.0)
Hemoglobin: 13.6 g/dL (ref 13.0–17.7)
Immature Grans (Abs): 0 10*3/uL (ref 0.0–0.1)
Immature Granulocytes: 0 %
Lymphocytes Absolute: 2.2 10*3/uL (ref 0.7–3.1)
Lymphs: 27 %
MCH: 24.2 pg — ABNORMAL LOW (ref 26.6–33.0)
MCHC: 31.7 g/dL (ref 31.5–35.7)
MCV: 77 fL — ABNORMAL LOW (ref 79–97)
Monocytes Absolute: 0.5 10*3/uL (ref 0.1–0.9)
Monocytes: 6 %
Neutrophils Absolute: 5.1 10*3/uL (ref 1.4–7.0)
Neutrophils: 63 %
Platelets: 296 10*3/uL (ref 150–450)
RBC: 5.61 x10E6/uL (ref 4.14–5.80)
RDW: 14.4 % (ref 11.6–15.4)
WBC: 8.1 10*3/uL (ref 3.4–10.8)

## 2021-03-03 LAB — COMPREHENSIVE METABOLIC PANEL
ALT: 18 IU/L (ref 0–44)
AST: 16 IU/L (ref 0–40)
Albumin/Globulin Ratio: 1.6 (ref 1.2–2.2)
Albumin: 4.6 g/dL (ref 4.0–5.0)
Alkaline Phosphatase: 109 IU/L (ref 44–121)
BUN/Creatinine Ratio: 18 (ref 9–20)
BUN: 14 mg/dL (ref 6–24)
Bilirubin Total: <0.2 mg/dL (ref 0.0–1.2)
CO2: 25 mmol/L (ref 20–29)
Calcium: 9.2 mg/dL (ref 8.7–10.2)
Chloride: 101 mmol/L (ref 96–106)
Creatinine, Ser: 0.8 mg/dL (ref 0.76–1.27)
Globulin, Total: 2.8 g/dL (ref 1.5–4.5)
Glucose: 117 mg/dL — ABNORMAL HIGH (ref 65–99)
Potassium: 4.1 mmol/L (ref 3.5–5.2)
Sodium: 141 mmol/L (ref 134–144)
Total Protein: 7.4 g/dL (ref 6.0–8.5)
eGFR: 111 mL/min/{1.73_m2} (ref >59)

## 2021-03-03 LAB — LIPID PANEL
Chol/HDL Ratio: 4.1 ratio (ref 0.0–5.0)
Cholesterol, Total: 120 mg/dL (ref 100–199)
HDL: 29 mg/dL — ABNORMAL LOW (ref >39)
LDL Chol Calc (NIH): 57 mg/dL (ref 0–99)
Triglycerides: 210 mg/dL — ABNORMAL HIGH (ref 0–149)
VLDL Cholesterol Cal: 34 mg/dL (ref 5–40)

## 2021-03-03 LAB — HEMOGLOBIN A1C
Est. average glucose Bld gHb Est-mCnc: 134 mg/dL
Hgb A1c MFr Bld: 6.3 % — ABNORMAL HIGH (ref 4.8–5.6)

## 2021-03-03 LAB — MICROALBUMIN / CREATININE URINE RATIO
Creatinine, Urine: 109.5 mg/dL
Microalb/Creat Ratio: 13 mg/g creat (ref 0–29)
Microalbumin, Urine: 13.7 ug/mL

## 2021-03-04 ENCOUNTER — Telehealth: Payer: Self-pay

## 2021-03-04 NOTE — Telephone Encounter (Signed)
Contacted pt to go over lab results pt didn't answer lvm  [place] interperter: Elita Quick ID: [phone] Sent a CRM and forward labs to NT to give pt labs when they call back

## 2021-06-07 ENCOUNTER — Other Ambulatory Visit: Payer: Self-pay

## 2021-07-04 NOTE — Progress Notes (Deleted)
Established Patient Office Visit  Subjective:  Patient ID: Jacob Schneider, male    DOB: 25-Aug-1975  Age: 46 y.o. MRN: [id number]  CC: No chief complaint on file.   HPI Jacob Schneider presents for   Fit pcv flu eye   01/01/20 44 y.o.M Latino the patient is accompanied by his friend who serves also as an interpreter   Patient comes today as a post hospital follow-up and to establish care.  Patient has a history of hypertension diagnosed for 9 years but was unaware he had diabetes.  Patient also has history of alcoholic gastritis.  He has been out of his medications for hypertension for some time during the pandemic.  He previously had been followed for primary care but lost contact and during the pandemic.   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  He was found to have diabetes out-of-control and was discharged on NPH twice daily Metformin twice daily and Amaryl daily.  Patient returns today in follow-up.  He is off his blood pressure medicines.  Blood pressure is ranging slightly elevated at this visit.  He states his blood sugars at home of been in the 1 72-80 range.  He states he has been compliant with the NPH.  Note during this hospitalization he was found to have fat in the liver and an enlarged appendix that would need further follow-up.  Patient has a prior history of excessive ethanol use and had history of alcohol delirium previously that has resolved this was associated with DKA as well as electrolyte disturbances.  That admission was in 2018.  Below is the discharge summary of the most recent admission     Admit date: 12/05/2019 Discharge date: 12/07/2019   Admitting Diagnosis: Subcapsular hematoma of kidney 2 rib fractures Diabetes with hyperglycemia   Discharge Diagnosis Fall 48 hrs prior to admit Left  10-11 rib fxs RTP/ L perinephric hematoma DM    Consultants None   Procedures None   Hospital Course:  Jacob Schneider is a 46yo male h/o HTN and DM off his medications for several months, who presented to [facility] 6/3 after he was working at a house when he missed his footing and fell down the stairs. He fell on his left side. He did not lose consciousness. He has had worsening pain on the left side over the last 2 days so came into the hospital. He denies dizziness. Pain is worse with deep breathing.  Workup showed Subcapsular hematoma of kidney and 2 rib fractures.  Patient was admitted to the trauma service for pain control and observation. H/H monitored and remained stable.  Patient worked with therapies during this admission. On 6/5 the patient was tolerating diet, ambulating well, pain well controlled, vital signs stable and felt stable for discharge home.  Patient will follow up as below and knows to call with questions or concerns.  We will send 1 month supply of his diabetes medications, and he knows to follow up with PCP to discuss this and his hypertension.      Note this patient comes in with pictures of his old prescriptions showing he had been on atorvastatin 20 mg lisinopril 2.5 mg HCTZ 25 mg and gabapentin 100 mg in the past.   The patient states he does eat quite a bit of carbohydrates  for breakfast lunch and dinner.  He states he is no longer drinking alcohol   02/05/2020 This patient was seen in return follow-up for diabetes and hypertension.  He states at home his diabetes control is showing glucose of 1 50-1 60 he did have Trulicity added in July of this year.  He is on 75 mcg weekly of this along with the Lantus and Metformin.  Today he comes in with a hemoglobin A1c of 8.0 noted had been 12 in June so there is an improvement here.   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  He is currently trying to finish his application for his orange card for the surgery referral.   Patient states his diet still has heavy carbohydrate intake.  Note he is willing to get the Covid vaccine and we told him where he can go for this vaccination.   Patient currently is not drinking any alcohol. 06/18/2020 Patient seen return follow-up doing quite well on arrival A1c is 6.1 blood sugar 120 he does complain of some itching in the back he has been 7 months without alcohol he is no longer drinking no longer smoking as well he is on an exercise program.  He has no specific complaints other than a rash on his back and upper arms. He is here to get his flu vaccine is yet to receive the Covid vaccine series   10/19/20 PT seen in return visit doing well.  No new complaintes.  The patient's been compliant with his blood pressure medications.  Blood sugars have been running well with no change in Trulicity and metformin and he is on an improved diet.  Patient has no other complaints at this time.   12/01/20  this visit was accomplished with Spanish interpreter Jacob Schneider [phone] video interpreter Note this visit occurred with full PPE in place due to the fact that this patient screening symptoms were compatible with COVID and he is unvaccinated   This patient comes to the office today for follow-up of hypertension and diabetes but on arrival states he has had body aches severe fatigue headaches and a dry cough for 2 days.  Note he is unvaccinated for COVID.  He denies any stomach issues or significant respiratory distress.  His temperature is elevated 99 degrees on arrival.  He is unclear if he has had any exposure to COVID.   His diabetes and blood pressure doing well on arrival blood pressure was 106/75 blood sugar was 115 A1c was 6.1   Patient has been compliant with all of his diabetic medications and blood pressure medicine and does need refills   03/02/2021 Patient seen in return  follow-up with type 2 diabetes last A1c 6.1 in May.  He ruled out with COVID during his May illnesses appear to be a viral syndrome he is now fully recovered from this.  Patient does note however the onset 3 weeks ago of a cloudiness forming in the left eye with decreased visual acuity.  He has no pain in the left eye and no redness.  Patient does need a colon screening for colon cancer.  He does need a flu vaccine and agrees to this today.  He needs a general eye exam in any case because of his diabetes.  He still has some numbness in the feet and is using the gabapentin.  He maintains Trulicity Lantus and metformin for  his diabetes and has noted his blood sugars have been improved.   There are no other complaints at this visit.  He does request refills on medications.  Blood pressure remains excellent on lisinopril coming in today to 123/86 At last OV? Of Covid but Covid test was negative  07/06/2021  Essential hypertension       Blood pressure well controlled continue lisinopril        Relevant Medications    atorvastatin (LIPITOR) 20 MG tablet    lisinopril (ZESTRIL) 30 MG tablet    Other Relevant Orders    CBC with Differential/Platelet        Endocrine    Controlled type 2 diabetes mellitus (HCC) - Primary      Type 2 diabetes now well controlled A1c down to 6.1 continue Lantus Trulicity and metformin follow-up urine microalbumin and lipid panel        Relevant Medications    atorvastatin (LIPITOR) 20 MG tablet    Dulaglutide (TRULICITY) 2.44 WN/0.2VO SOPN    insulin glargine (LANTUS SOLOSTAR) 100 UNIT/ML Solostar Pen    Insulin Pen Needle (TRUEPLUS 5-BEVEL PEN NEEDLES) 32G X 4 MM MISC    lisinopril (ZESTRIL) 30 MG tablet    metFORMIN (GLUCOPHAGE) 1000 MG tablet        Nervous and Auditory    Neuropathy      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 continue gabapentin            Musculoskeletal and Integument    Onychomycosis of multiple toenails with type 2 diabetes  mellitus (HCC)      Bilateral toenail fungus with T2DM   Plan topical penlac         Relevant Medications    atorvastatin (LIPITOR) 20 MG tablet    Dulaglutide (TRULICITY) 5.36 UY/4.0HK SOPN    insulin glargine (LANTUS SOLOSTAR) 100 UNIT/ML Solostar Pen    lisinopril (ZESTRIL) 30 MG tablet    metFORMIN (GLUCOPHAGE) 1000 MG tablet    ciclopirox (PENLAC) 8 % solution        Other    Hyperlipidemia      Hyperlipidemia improved continue atorvastatin recheck liver function and lipid panel        Relevant Medications    atorvastatin (LIPITOR) 20 MG tablet    lisinopril (ZESTRIL) 30 MG tablet    Other Relevant Orders    Lipid panel    Decreased vision of left eye      Subacute onset cloudiness left unclear etiology I was not able to examine the eye properly   Referral to optometry was made at [facility] ASAP patient agrees he will attend         Past Medical History:  Diagnosis Date   Alcohol withdrawal delirium, acute, hyperactive ([place]) 03/30/2015   Anxiety    Chronic lower back pain    Diabetes mellitus type II, uncontrolled ([place])    "I think it's type 1" (07/31/2017)   DKA (diabetic ketoacidoses) 07/30/2017   History of stomach ulcers    Hypertension    Mucocele of appendix 02/05/2020   Renal hematoma 12/05/2019    Past Surgical History:  Procedure Laterality Date   BACK SURGERY     LUMBAR [place] SURGERY  2004    Family History  Problem Relation Age of Onset   Diabetes Father     Social History   Socioeconomic History   Marital status: Single    Spouse name: Not on file   Number of children: Not  on file   Years of education: Not on file   Highest education level: Not on file  Occupational History   Not on file  Tobacco Use   Smoking status: Former    Packs/day: 0.10    Years: 28.00    Pack years: 2.80    Types: Cigarettes   Smokeless tobacco: Never   Tobacco comments:    07/31/2017 "stopped before Thanksgiving"  Vaping Use   Vaping Use: Never used   Substance and Sexual Activity   Alcohol use: Not Currently   Drug use: Not Currently    Types: Cocaine    Comment: 07/31/2017 "none since ~ 05/2017"   Sexual activity: Yes    Birth control/protection: None  Other Topics Concern   Not on file  Social History Narrative   Not on file   Social Determinants of Health   Financial Resource Strain: Not on file  Food Insecurity: Not on file  Transportation Needs: Not on file  Physical Activity: Not on file  Stress: Not on file  Social Connections: Not on file  Intimate Partner Violence: Not on file    Outpatient Medications Prior to Visit  Medication Sig Dispense Refill   atorvastatin (LIPITOR) 20 MG tablet TAKE 1 TABLET (20 MG TOTAL) BY MOUTH DAILY. 90 tablet 3   blood glucose meter kit and supplies KIT Dispense based on patient and insurance preference. Daily in AM 1 each 0   ciclopirox (PENLAC) 8 % solution Apply topically at bedtime. Apply over nail and surrounding skin. Apply daily over previous coat. After seven (7) days, may remove with alcohol and continue cycle. 6.6 mL 0   Dulaglutide (TRULICITY) 8.18 HU/3.1SH SOPN Inject 0.75 mg into the skin once a week. 2 mL 3   gabapentin (NEURONTIN) 100 MG capsule TAKE 1 CAPSULE (100 MG TOTAL) BY MOUTH 3 (THREE) TIMES DAILY. 90 capsule 3   insulin glargine (LANTUS SOLOSTAR) 100 UNIT/ML Solostar Pen Inject 20 Units into the skin daily. 6 mL 2   Insulin Pen Needle (TECHLITE PEN NEEDLES) 32G X 4 MM MISC Use as instructed to inject Lantus daily. 100 each 2   lisinopril (ZESTRIL) 30 MG tablet Take 1 tablet (30 mg total) by mouth daily. 60 tablet 2   metFORMIN (GLUCOPHAGE) 1000 MG tablet TAKE 1 TABLET (1,000 MG TOTAL) BY MOUTH 2 (TWO) TIMES DAILY WITH A MEAL. 60 tablet 4   TRUEplus Lancets 28G MISC Use to check blood sugar three times daily. 100 each 2   No facility-administered medications prior to visit.    No Known Allergies  ROS Review of Systems    Objective:    Physical  Exam  There were no vitals taken for this visit. Wt Readings from Last 3 Encounters:  12/01/20 203 lb (92.1 kg)  06/18/20 193 lb 12.8 oz (87.9 kg)  02/05/20 190 lb (86.2 kg)     Health Maintenance Due  Topic Date Due   OPHTHALMOLOGY EXAM  Never done   Pneumococcal Vaccine 68-41 Years old (2 - PCV) 08/21/2018   COLON CANCER SCREENING ANNUAL FOBT  Never done   INFLUENZA VACCINE  02/01/2021    There are no preventive care reminders to display for this patient.  No results found for: TSH Lab Results  Component Value Date   WBC 8.1 03/02/2021   HGB 13.6 03/02/2021   HCT 42.9 03/02/2021   MCV 77 (L) 03/02/2021   PLT 296 03/02/2021   Lab Results  Component Value Date   NA 141 03/02/2021  K 4.1 03/02/2021   CO2 25 03/02/2021   GLUCOSE 117 (H) 03/02/2021   BUN 14 03/02/2021   CREATININE 0.80 03/02/2021   BILITOT <0.2 03/02/2021   ALKPHOS 109 03/02/2021   AST 16 03/02/2021   ALT 18 03/02/2021   PROT 7.4 03/02/2021   ALBUMIN 4.6 03/02/2021   CALCIUM 9.2 03/02/2021   ANIONGAP 10 12/06/2019   EGFR 111 03/02/2021   Lab Results  Component Value Date   CHOL 120 03/02/2021   Lab Results  Component Value Date   HDL 29 (L) 03/02/2021   Lab Results  Component Value Date   LDLCALC 57 03/02/2021   Lab Results  Component Value Date   TRIG 210 (H) 03/02/2021   Lab Results  Component Value Date   CHOLHDL 4.1 03/02/2021   Lab Results  Component Value Date   HGBA1C 6.3 (H) 03/02/2021      Assessment & Plan:   Problem List Items Addressed This Visit   None   No orders of the defined types were placed in this encounter.   Follow-up: No follow-ups on file.    Asencion Noble, MD

## 2021-07-06 ENCOUNTER — Ambulatory Visit: Payer: Self-pay | Admitting: Critical Care Medicine

## 2022-01-12 ENCOUNTER — Ambulatory Visit: Payer: Self-pay | Admitting: Physician Assistant

## 2022-01-25 NOTE — Telephone Encounter (Signed)
error
